# Patient Record
Sex: Male | Born: 1970 | Race: White | Hispanic: No | Marital: Married | Smoking: Never smoker
Health system: Southern US, Community
[De-identification: ages and names within clinical notes are randomized; demographics above are authoritative.]

## PROBLEM LIST (undated history)

## (undated) DIAGNOSIS — B181 Chronic viral hepatitis B without delta-agent: Secondary | ICD-10-CM

## (undated) DIAGNOSIS — E7800 Pure hypercholesterolemia, unspecified: Secondary | ICD-10-CM

## (undated) DIAGNOSIS — E109 Type 1 diabetes mellitus without complications: Secondary | ICD-10-CM

## (undated) HISTORY — DX: Type 1 diabetes mellitus without complications: E10.9

## (undated) HISTORY — DX: Pure hypercholesterolemia, unspecified: E78.00

## (undated) HISTORY — DX: Chronic viral hepatitis B without delta-agent: B18.1

---

## 2003-03-13 ENCOUNTER — Encounter: Admission: RE | Admit: 2003-03-13 | Discharge: 2003-06-11 | Payer: Self-pay | Admitting: *Deleted

## 2004-04-29 ENCOUNTER — Encounter: Admission: RE | Admit: 2004-04-29 | Discharge: 2004-07-28 | Payer: Self-pay | Admitting: *Deleted

## 2005-02-12 ENCOUNTER — Ambulatory Visit: Payer: Self-pay | Admitting: "Endocrinology

## 2005-02-25 ENCOUNTER — Ambulatory Visit: Payer: Self-pay | Admitting: "Endocrinology

## 2005-05-03 ENCOUNTER — Ambulatory Visit: Payer: Self-pay | Admitting: "Endocrinology

## 2005-05-05 ENCOUNTER — Ambulatory Visit: Payer: Self-pay | Admitting: "Endocrinology

## 2005-05-07 ENCOUNTER — Ambulatory Visit: Payer: Self-pay | Admitting: "Endocrinology

## 2005-05-10 ENCOUNTER — Ambulatory Visit: Payer: Self-pay | Admitting: "Endocrinology

## 2005-07-06 ENCOUNTER — Ambulatory Visit: Payer: Self-pay | Admitting: "Endocrinology

## 2005-09-01 ENCOUNTER — Ambulatory Visit: Payer: Self-pay | Admitting: "Endocrinology

## 2006-06-01 ENCOUNTER — Ambulatory Visit: Payer: Self-pay | Admitting: "Endocrinology

## 2006-07-13 ENCOUNTER — Ambulatory Visit: Payer: Self-pay | Admitting: "Endocrinology

## 2006-09-23 ENCOUNTER — Ambulatory Visit: Payer: Self-pay | Admitting: "Endocrinology

## 2006-12-02 ENCOUNTER — Ambulatory Visit: Payer: Self-pay | Admitting: Family Medicine

## 2006-12-20 ENCOUNTER — Ambulatory Visit: Payer: Self-pay | Admitting: "Endocrinology

## 2007-08-10 ENCOUNTER — Ambulatory Visit: Payer: Self-pay | Admitting: "Endocrinology

## 2007-12-04 ENCOUNTER — Ambulatory Visit: Payer: Self-pay | Admitting: "Endocrinology

## 2007-12-06 ENCOUNTER — Ambulatory Visit: Payer: Self-pay | Admitting: Family Medicine

## 2008-07-30 ENCOUNTER — Ambulatory Visit: Payer: Self-pay | Admitting: "Endocrinology

## 2008-07-30 LAB — CONVERTED CEMR LAB
AST: 85 units/L — ABNORMAL HIGH (ref 0–37)
BUN: 9 mg/dL (ref 6–23)
Creatinine, Urine: 274.4 mg/dL
Free T4: 1.08 ng/dL (ref 0.89–1.80)
HDL: 49 mg/dL (ref >39)
LDL Cholesterol: 88 mg/dL (ref 0–99)
Microalb, Ur: 0.59 mg/dL (ref 0.00–1.89)
Sex Hormone Binding: 56 nmol/L (ref 13–71)
Testosterone-% Free: 1.5 % — ABNORMAL LOW (ref 1.6–2.9)
Testosterone: 577.76 ng/dL (ref 350–890)
Total Protein: 7.4 g/dL (ref 6.0–8.3)

## 2008-08-05 ENCOUNTER — Ambulatory Visit: Payer: Self-pay | Admitting: Family Medicine

## 2008-08-06 ENCOUNTER — Encounter: Admission: RE | Admit: 2008-08-06 | Discharge: 2008-08-06 | Payer: Self-pay | Admitting: Family Medicine

## 2008-08-27 ENCOUNTER — Ambulatory Visit: Payer: Self-pay | Admitting: "Endocrinology

## 2008-08-30 DIAGNOSIS — E785 Hyperlipidemia, unspecified: Secondary | ICD-10-CM | POA: Insufficient documentation

## 2008-08-30 DIAGNOSIS — E069 Thyroiditis, unspecified: Secondary | ICD-10-CM | POA: Insufficient documentation

## 2008-08-30 DIAGNOSIS — E049 Nontoxic goiter, unspecified: Secondary | ICD-10-CM | POA: Insufficient documentation

## 2008-08-30 DIAGNOSIS — I10 Essential (primary) hypertension: Secondary | ICD-10-CM | POA: Insufficient documentation

## 2008-08-30 DIAGNOSIS — R000 Tachycardia, unspecified: Secondary | ICD-10-CM

## 2008-08-30 DIAGNOSIS — E109 Type 1 diabetes mellitus without complications: Secondary | ICD-10-CM

## 2008-08-30 DIAGNOSIS — G589 Mononeuropathy, unspecified: Secondary | ICD-10-CM | POA: Insufficient documentation

## 2008-08-30 DIAGNOSIS — K3184 Gastroparesis: Secondary | ICD-10-CM | POA: Insufficient documentation

## 2008-09-02 ENCOUNTER — Ambulatory Visit: Payer: Self-pay | Admitting: Internal Medicine

## 2008-09-02 DIAGNOSIS — R740 Nonspecific elevation of levels of transaminase and lactic acid dehydrogenase [LDH]: Secondary | ICD-10-CM

## 2008-09-02 LAB — CONVERTED CEMR LAB
Ferritin: 36.1 ng/mL (ref 22.0–322.0)
Iron: 154 ug/dL (ref 42–165)
Prothrombin Time: 10.6 s — ABNORMAL LOW (ref 10.9–13.3)
Saturation Ratios: 35.6 % (ref 20.0–50.0)
Transferrin: 308.9 mg/dL (ref >=212.0)

## 2008-09-04 ENCOUNTER — Ambulatory Visit (HOSPITAL_COMMUNITY): Admission: RE | Admit: 2008-09-04 | Discharge: 2008-09-04 | Payer: Self-pay | Admitting: Internal Medicine

## 2008-09-06 ENCOUNTER — Ambulatory Visit: Payer: Self-pay | Admitting: "Endocrinology

## 2008-09-09 ENCOUNTER — Ambulatory Visit: Payer: Self-pay | Admitting: "Endocrinology

## 2008-09-09 LAB — CONVERTED CEMR LAB
Anti Nuclear Antibody(ANA): NEGATIVE
Ceruloplasmin: 43 mg/dL (ref 21–63)
HCV Ab: NEGATIVE
HEP B PCR: >110000000 — ABNORMAL HIGH (ref <29)
Hep B E Ab: NEGATIVE
Hep B S Ab: NEGATIVE
Hepatitis B Surface Ag: POSITIVE — AB
Tissue Transglutaminase Ab, IgA: 0.8 units (ref <7)

## 2008-09-11 ENCOUNTER — Encounter: Payer: Self-pay | Admitting: Internal Medicine

## 2008-09-30 ENCOUNTER — Ambulatory Visit: Payer: Self-pay | Admitting: Internal Medicine

## 2008-09-30 DIAGNOSIS — B1910 Unspecified viral hepatitis B without hepatic coma: Secondary | ICD-10-CM

## 2008-10-28 ENCOUNTER — Ambulatory Visit: Payer: Self-pay | Admitting: Family Medicine

## 2008-11-13 ENCOUNTER — Encounter: Payer: Self-pay | Admitting: Internal Medicine

## 2008-11-28 ENCOUNTER — Ambulatory Visit: Payer: Self-pay | Admitting: "Endocrinology

## 2009-02-12 ENCOUNTER — Encounter: Payer: Self-pay | Admitting: Internal Medicine

## 2009-04-02 ENCOUNTER — Ambulatory Visit: Payer: Self-pay | Admitting: Internal Medicine

## 2009-04-04 LAB — CONVERTED CEMR LAB
Albumin: 3.8 g/dL (ref 3.5–5.2)
BUN: 6 mg/dL (ref 6–23)
Basophils Absolute: 0 10*3/uL (ref 0.0–0.1)
Basophils Relative: 0.1 % (ref 0.0–3.0)
Eosinophils Absolute: 0 10*3/uL (ref 0.0–0.7)
Eosinophils Relative: 0.7 % (ref 0.0–5.0)
Potassium: 4.5 meq/L (ref 3.5–5.1)
RDW: 12.5 % (ref 11.5–14.6)
Sodium: 143 meq/L (ref 135–145)
Total Bilirubin: 1.1 mg/dL (ref 0.3–1.2)

## 2009-04-07 ENCOUNTER — Ambulatory Visit: Payer: Self-pay | Admitting: "Endocrinology

## 2009-04-11 LAB — CONVERTED CEMR LAB: Hepatitis B DNA: 55300 IU/mL — ABNORMAL HIGH (ref <29)

## 2009-05-21 ENCOUNTER — Encounter: Payer: Self-pay | Admitting: Internal Medicine

## 2009-08-07 ENCOUNTER — Ambulatory Visit: Payer: Self-pay | Admitting: "Endocrinology

## 2009-08-22 ENCOUNTER — Ambulatory Visit: Payer: Self-pay | Admitting: Internal Medicine

## 2009-08-25 LAB — CONVERTED CEMR LAB
ALT: 45 units/L (ref 0–53)
AST: 35 units/L (ref 0–37)
BUN: 6 mg/dL (ref 6–23)
Basophils Relative: 1.4 % (ref 0.0–3.0)
Bilirubin, Direct: 0.1 mg/dL (ref 0.0–0.3)
CO2: 32 meq/L (ref 19–32)
Calcium: 9.3 mg/dL (ref 8.4–10.5)
Chloride: 101 meq/L (ref 96–112)
Creatinine, Ser: 1.1 mg/dL (ref 0.4–1.5)
HCT: 43 % (ref 39.0–52.0)
Lymphocytes Relative: 57.7 % — ABNORMAL HIGH (ref 12.0–46.0)
MCHC: 33.7 g/dL (ref 30.0–36.0)
MCV: 93 fL (ref 78.0–100.0)
Monocytes Absolute: 0.5 10*3/uL (ref 0.1–1.0)
Potassium: 4.3 meq/L (ref 3.5–5.1)
RBC: 4.63 M/uL (ref 4.22–5.81)
RDW: 12.1 % (ref 11.5–14.6)
WBC: 7.7 10*3/uL (ref 4.5–10.5)

## 2009-09-08 ENCOUNTER — Telehealth: Payer: Self-pay | Admitting: Internal Medicine

## 2009-10-03 ENCOUNTER — Ambulatory Visit: Payer: Self-pay | Admitting: "Endocrinology

## 2009-11-17 ENCOUNTER — Ambulatory Visit: Payer: Self-pay | Admitting: "Endocrinology

## 2009-11-21 ENCOUNTER — Encounter: Payer: Self-pay | Admitting: Internal Medicine

## 2010-03-02 IMAGING — CR DG CERVICAL SPINE 2 OR 3 VIEWS
3 series · 3 of 3 positions shown · non-contrast
Comparison: None

CLINICAL DATA: Neck pain

CERVICAL SPINE - 2-3 VIEW

[view not recorded (1 of 3)]
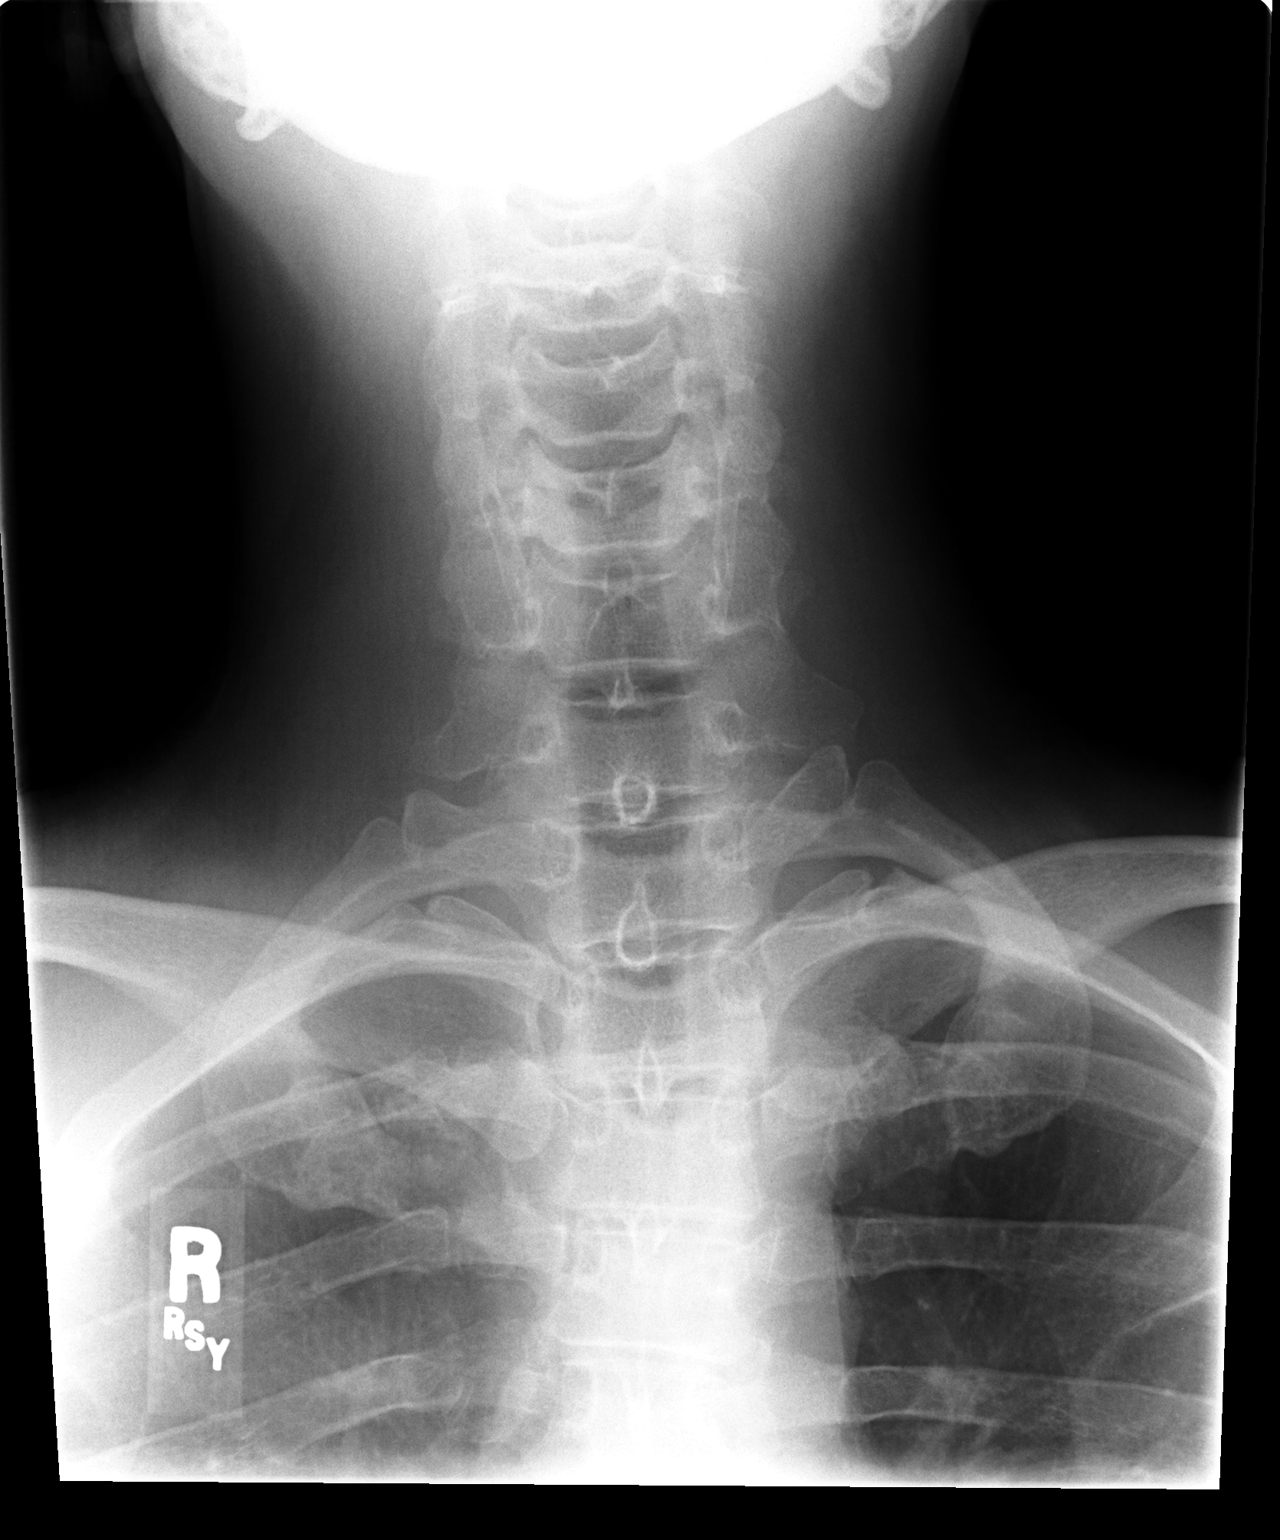

[view not recorded (2 of 3)]
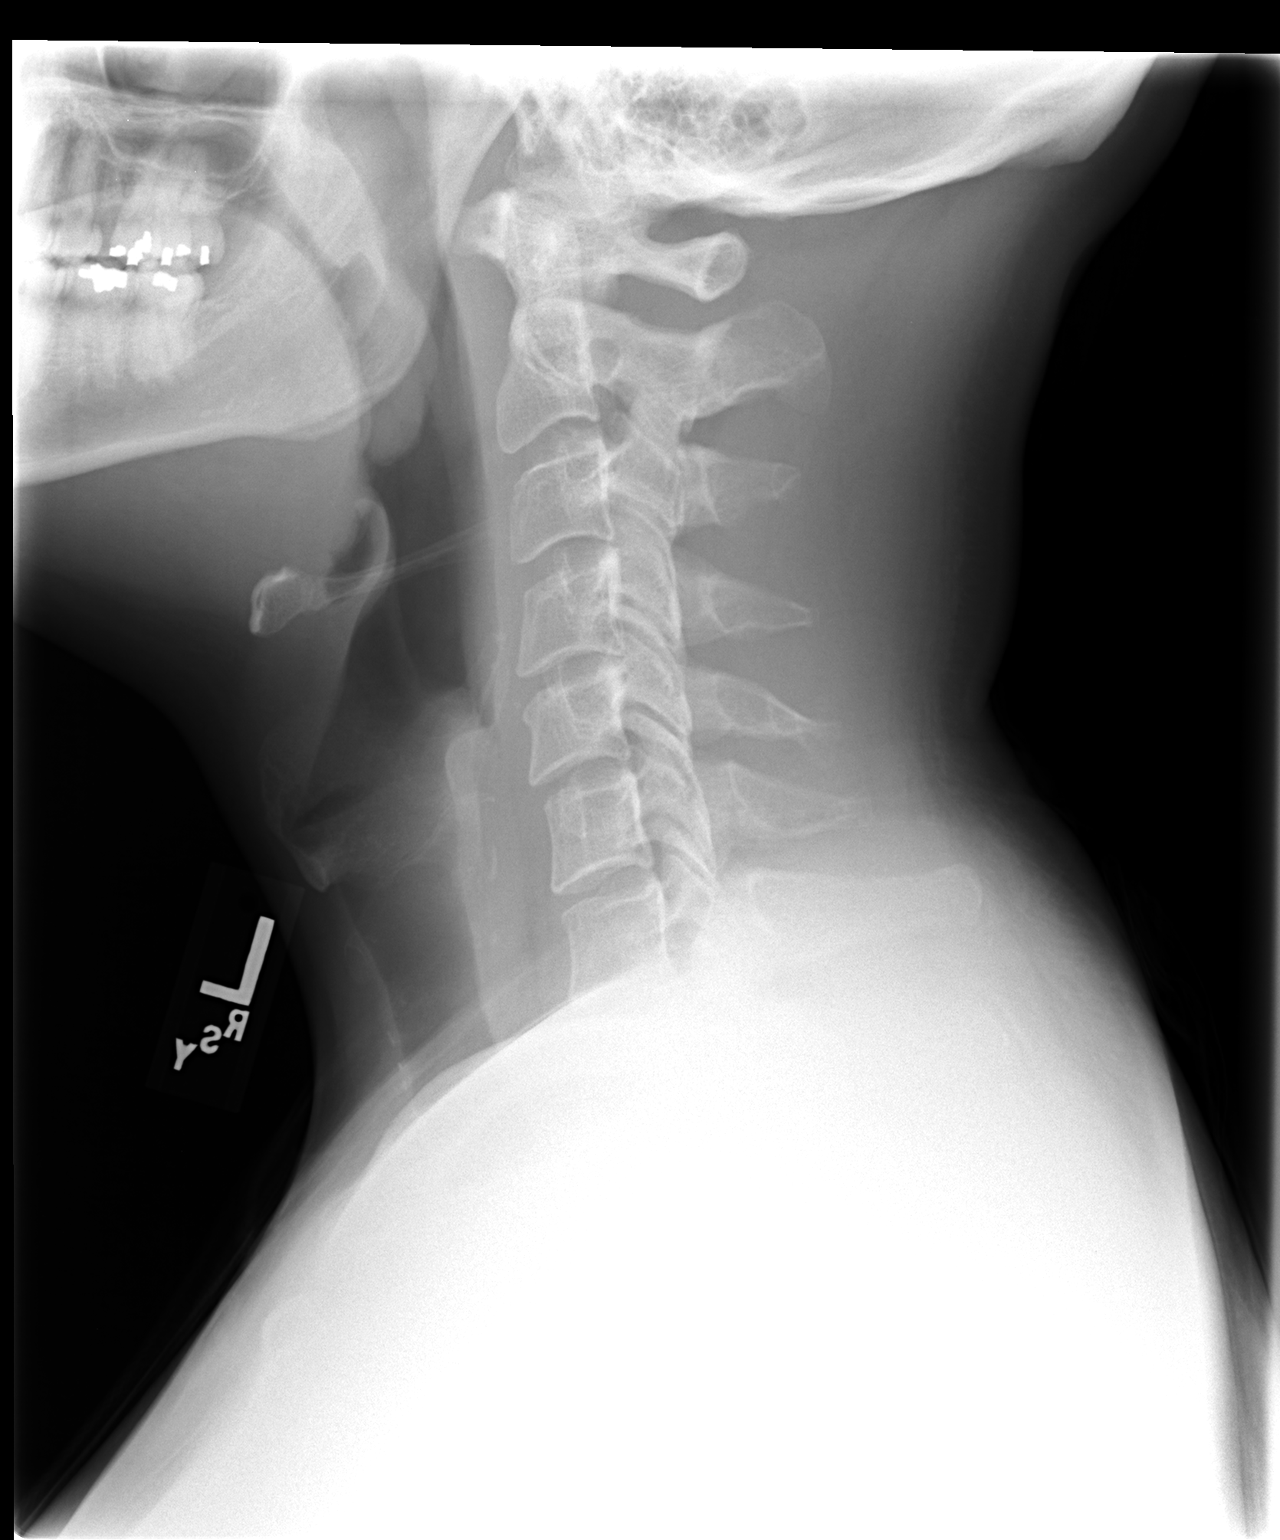

[view not recorded (3 of 3)]
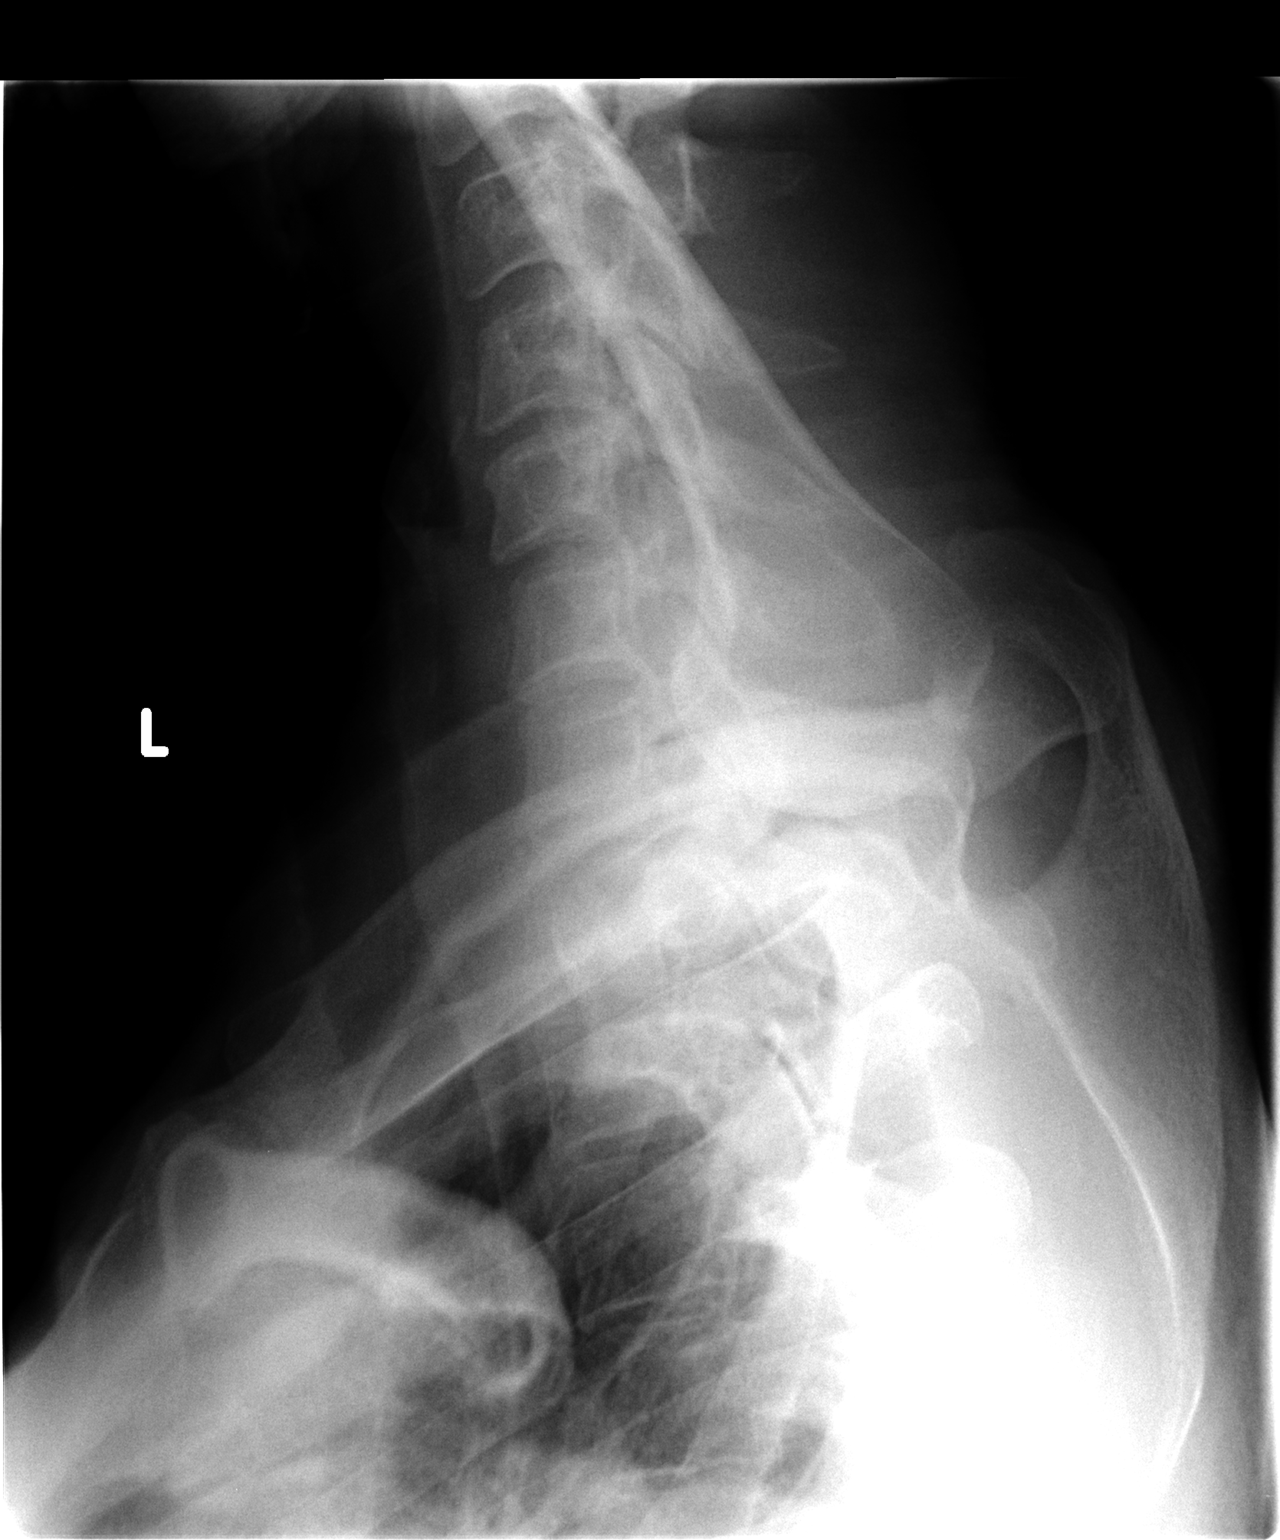

[3 of 3 positions shown; findings below may reference images not displayed]

FINDINGS: The lateral film demonstrates normal overall alignment of
the cervical vertebral bodies.  Minimal degenerative disease at C4-
5 and C5-6.  No acute bony findings or abnormal prevertebral soft
tissue swelling.  Small cervical ribs are noted bilaterally.  The
lung apices are clear.
IMPRESSION: 1.  Normal alignment and no acute bony findings.  Minimal
degenerative disc disease at C4-5 and C5-6.

## 2010-03-31 IMAGING — US US ABDOMEN COMPLETE
1 series · 14 of 25 positions shown · non-contrast
Comparison: None available

CLINICAL DATA: Elevated LFTs, hypertension, diabetes

ABDOMEN ULTRASOUND
TECHNIQUE: Complete abdominal ultrasound examination was performed
including evaluation of the liver, gallbladder, bile ducts,
pancreas, kidneys, spleen, IVC, and abdominal aorta.

[Series 1: unknown · 0.38mm/px · 14 of 93 slices shown]
[im 1/93]
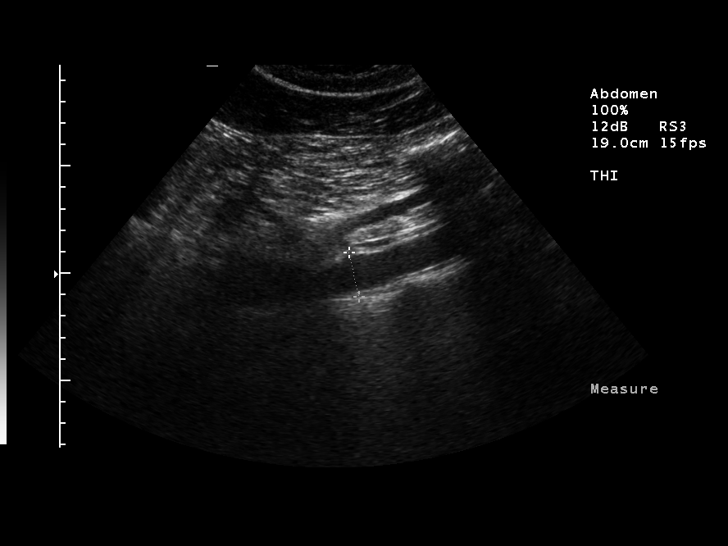
[im 8/93]
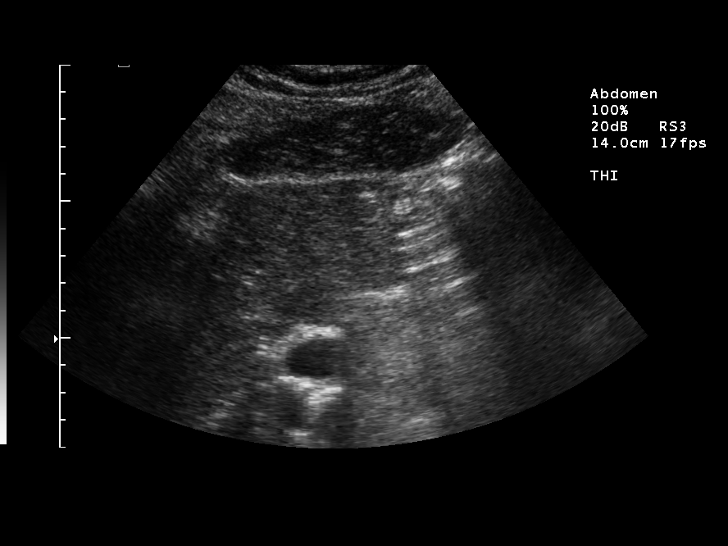
[im 16/93]
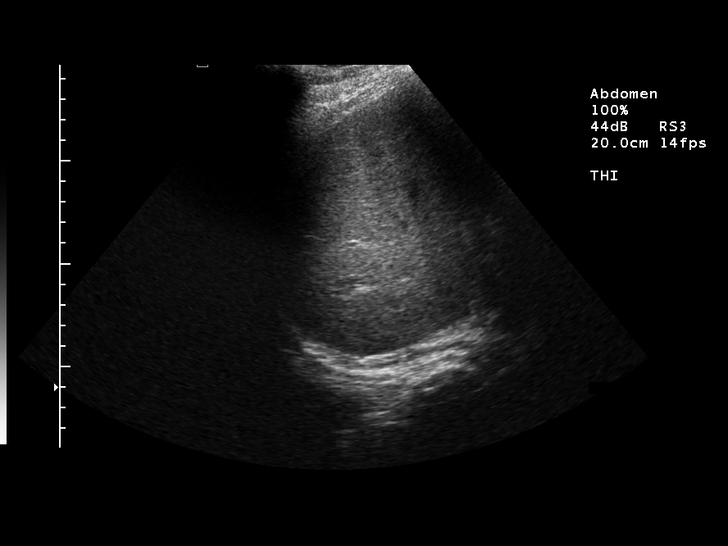
[im 24/93]
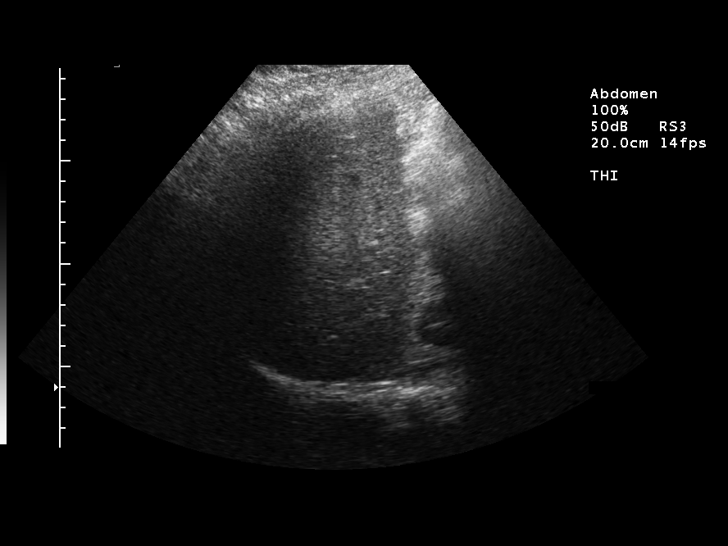
[im 31/93]
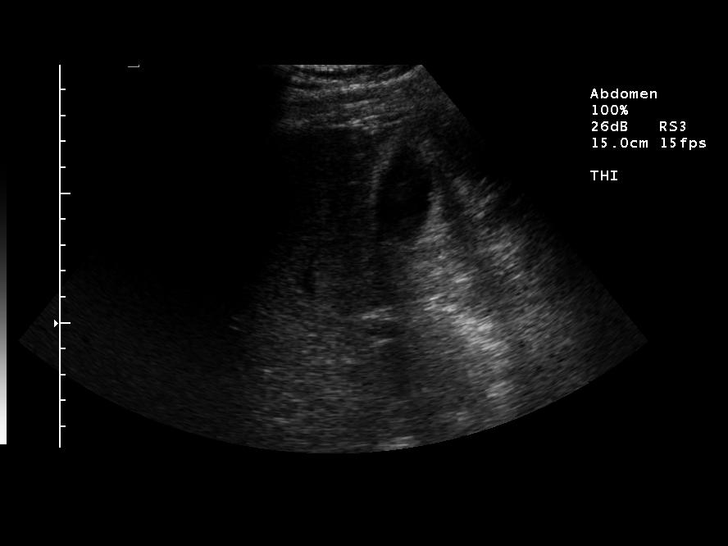
[im 35/93]
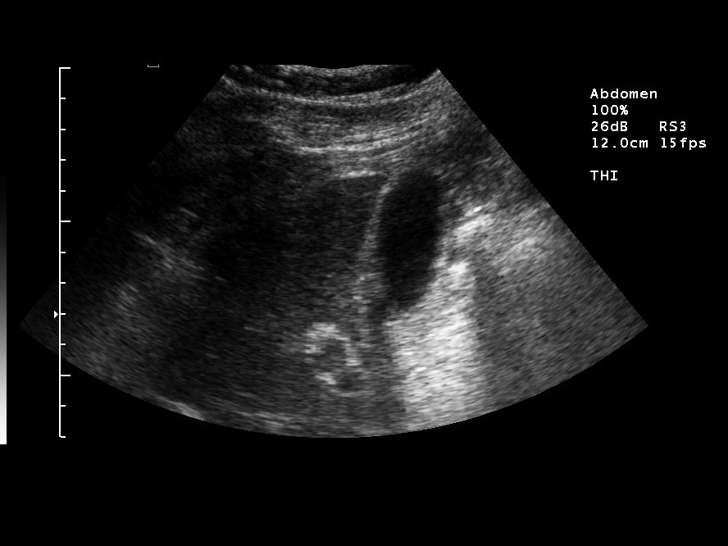
[im 43/93]
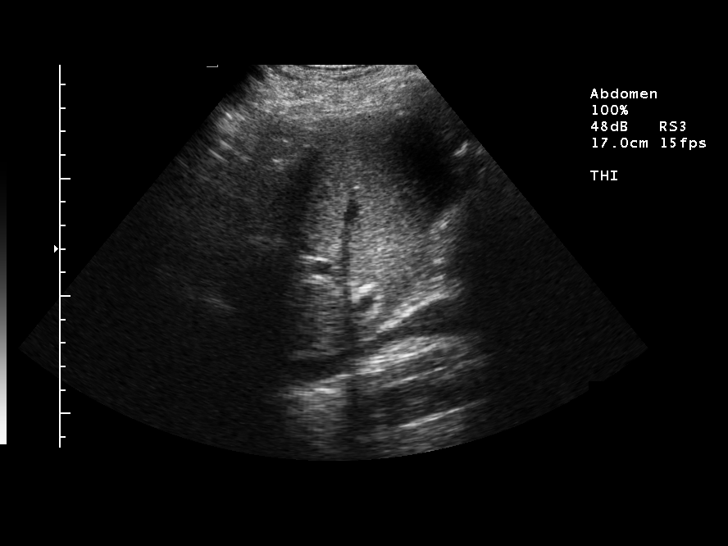
[im 50/93]
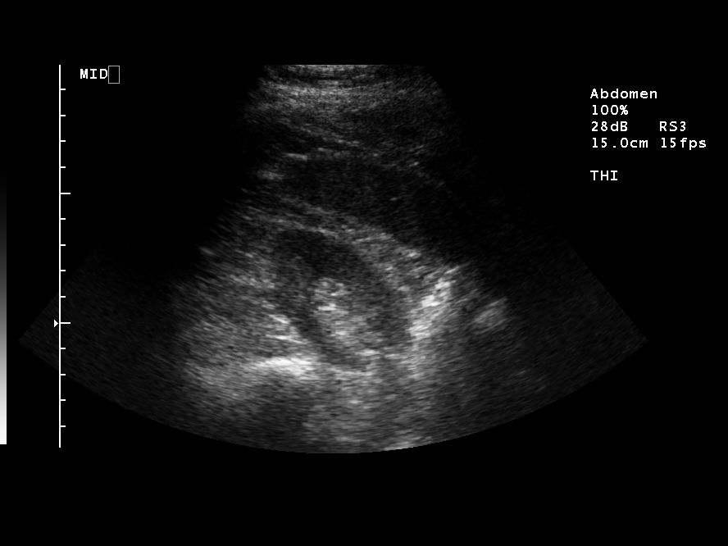
[im 58/93]
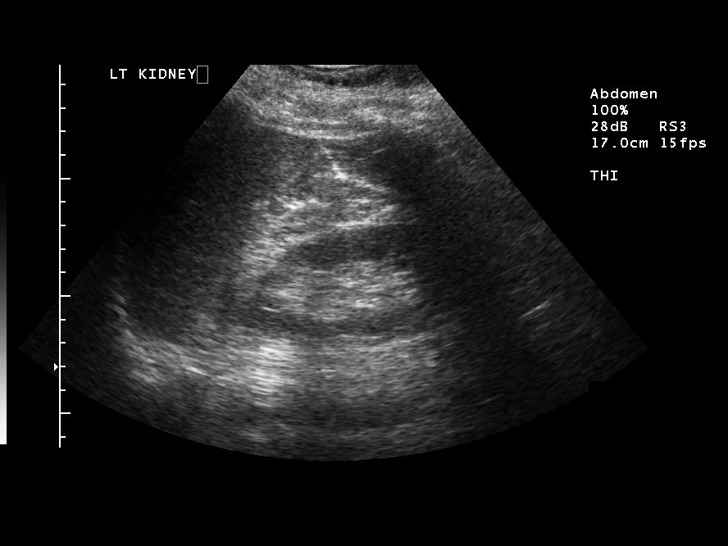
[im 62/93]
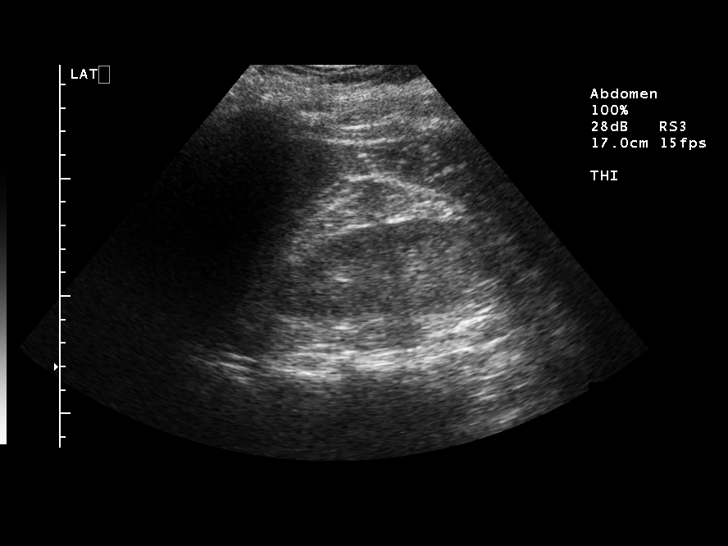
[im 70/93]
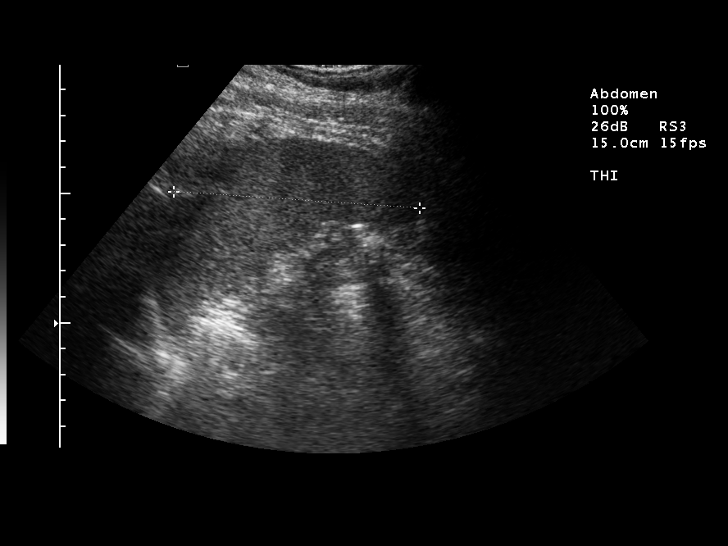
[im 77/93]
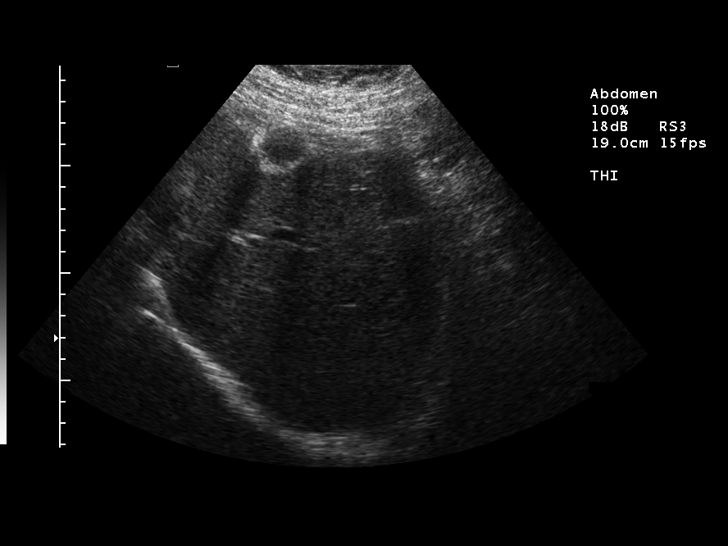
[im 85/93]
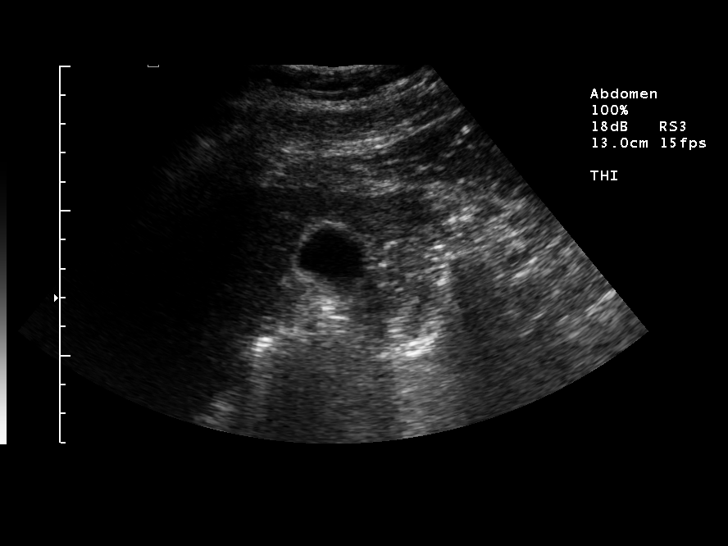
[im 93/93]
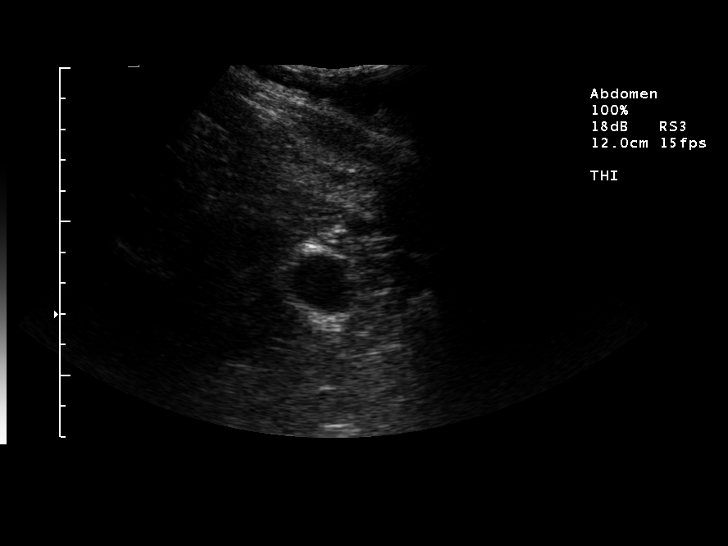

[14 of 25 positions shown; findings below may reference images not displayed]

FINDINGS: Gallbladder:  No gallstones.  No gallbladder wall thickening or
pericholecystic fluid. Negative sonographic Murphy's sign per the
ultrasound technologist.

Common bile duct: Normal in caliber.

Liver:  Normal size and echotexture.  No focal parenchymal
abnormalities.

Inferior vena cava:  Patent.

Pancreas:  Visualized portions unremarkable.

Spleen:  Normal size and echotexture without focal parenchymal
abnormalities.

Right kidney:  No hydronephrosis.   Normal parenchymal echotexture
without focal abnormalities.

Left kidney:  No hydronephrosis. Normal parenchymal echotexture
without focal abnormalities.

Abdominal aorta:  Visualized portions normal in caliber,
unremarkable.
IMPRESSION: Negative.  Unremarkable liver.

## 2010-04-14 ENCOUNTER — Ambulatory Visit: Payer: Self-pay | Admitting: "Endocrinology

## 2010-05-22 ENCOUNTER — Encounter: Payer: Self-pay | Admitting: Internal Medicine

## 2010-08-03 ENCOUNTER — Ambulatory Visit: Payer: Self-pay | Admitting: "Endocrinology

## 2010-09-23 ENCOUNTER — Encounter: Payer: Self-pay | Admitting: Internal Medicine

## 2010-10-20 NOTE — Letter (Signed)
[facility]   Imported By: Sherian Rein 06/04/2010 13:20:55  _____________________________________________________________________  External Attachment:    Type:   Image     Comment:   External Document

## 2010-10-20 NOTE — Letter (Signed)
[facility]   Imported By: Sherian Rein 11/28/2009 09:41:59  _____________________________________________________________________  External Attachment:    Type:   Image     Comment:   External Document

## 2010-10-22 NOTE — Letter (Signed)
[facility]   Imported By: Sherian Rein 10/05/2010 08:00:38  _____________________________________________________________________  External Attachment:    Type:   Image     Comment:   External Document

## 2010-11-05 ENCOUNTER — Ambulatory Visit (INDEPENDENT_AMBULATORY_CARE_PROVIDER_SITE_OTHER): Payer: BC Managed Care – PPO | Admitting: "Endocrinology

## 2010-11-05 DIAGNOSIS — E1065 Type 1 diabetes mellitus with hyperglycemia: Secondary | ICD-10-CM

## 2010-11-05 DIAGNOSIS — E1069 Type 1 diabetes mellitus with other specified complication: Secondary | ICD-10-CM

## 2010-11-05 DIAGNOSIS — E1142 Type 2 diabetes mellitus with diabetic polyneuropathy: Secondary | ICD-10-CM

## 2010-11-05 DIAGNOSIS — I10 Essential (primary) hypertension: Secondary | ICD-10-CM

## 2010-11-05 DIAGNOSIS — R000 Tachycardia, unspecified: Secondary | ICD-10-CM

## 2011-01-11 ENCOUNTER — Other Ambulatory Visit: Payer: Self-pay | Admitting: *Deleted

## 2011-01-11 ENCOUNTER — Encounter: Payer: Self-pay | Admitting: *Deleted

## 2011-02-09 ENCOUNTER — Ambulatory Visit (INDEPENDENT_AMBULATORY_CARE_PROVIDER_SITE_OTHER): Payer: BC Managed Care – PPO | Admitting: "Endocrinology

## 2011-02-09 VITALS — BP 125/75 | HR 82 | Wt 233.0 lb

## 2011-02-09 DIAGNOSIS — IMO0002 Reserved for concepts with insufficient information to code with codable children: Secondary | ICD-10-CM

## 2011-02-09 DIAGNOSIS — E1065 Type 1 diabetes mellitus with hyperglycemia: Secondary | ICD-10-CM

## 2011-02-09 DIAGNOSIS — E1169 Type 2 diabetes mellitus with other specified complication: Secondary | ICD-10-CM

## 2011-02-09 DIAGNOSIS — G609 Hereditary and idiopathic neuropathy, unspecified: Secondary | ICD-10-CM

## 2011-02-09 DIAGNOSIS — E063 Autoimmune thyroiditis: Secondary | ICD-10-CM

## 2011-02-09 DIAGNOSIS — E11649 Type 2 diabetes mellitus with hypoglycemia without coma: Secondary | ICD-10-CM

## 2011-02-09 DIAGNOSIS — E1142 Type 2 diabetes mellitus with diabetic polyneuropathy: Secondary | ICD-10-CM

## 2011-02-09 DIAGNOSIS — B353 Tinea pedis: Secondary | ICD-10-CM

## 2011-02-09 DIAGNOSIS — Z8619 Personal history of other infectious and parasitic diseases: Secondary | ICD-10-CM

## 2011-02-09 DIAGNOSIS — I10 Essential (primary) hypertension: Secondary | ICD-10-CM

## 2011-02-09 DIAGNOSIS — G909 Disorder of the autonomic nervous system, unspecified: Secondary | ICD-10-CM

## 2011-02-09 DIAGNOSIS — E1149 Type 2 diabetes mellitus with other diabetic neurological complication: Secondary | ICD-10-CM

## 2011-02-09 DIAGNOSIS — R000 Tachycardia, unspecified: Secondary | ICD-10-CM

## 2011-02-09 LAB — GLUCOSE, POCT (MANUAL RESULT ENTRY): POC Glucose: 116

## 2011-02-09 MED ORDER — KETOCONAZOLE 2 % EX CREA: TOPICAL_CREAM | Freq: Every day | CUTANEOUS | Status: AC

## 2011-02-09 NOTE — Progress Notes (Addendum)
CC: FU T1DM, hypoglycemia, hypertension, hyperlipidemia, autonomic neuropathy, tachycardia, gastroparesis, peripheral neuropathy, thyroiditis, hepatitis B, fatigue  HPI: 40 y.o. white male  1. Mr. Tuite was diagnosed with T2DM about 2002-2003. He was treated with metformin, Avandia, and Amaryl, but BGs were not well controlled, so Lantus was begun in 2005. When it became apparent that he would need a multiple daily injection regimen with Lantus and Novolog, he was referred to me by [name], RN, CDE, of the [facility]. I saw him for the first time on 05.26.06. He had just been started on Novolog aspart insulin at meals (120/25/10 plan). His PMH included recurrent hypoglycemia, hypertension, dyslipidemia, and proteinuria. He had had oral surgery in the past, but no other surgery. His psychiatric diagnoses included depression, anxiety, and dssociative identity disorder. He was also allergic to penicillin.  SH included the fact that he was gay. All HIV tests had been negative through.  He had recently leen laid off, had no health insurance, March 2006.His HbA1c was 10.6%. On exam he had a goiter and mild peripheral neuropathy. 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 We accepted him as a charity patient and enrolled him in our Diabetes Survival Skills Program. He was converted to a Medtronic 508 insulin pump in August 2006.  2. During the next five years his DM self-care efforts and HbA1c values waxed and waned, with A1c values ranging from 7.0-9.2%. His worst BGs occurred when he was being treated with interferon for hepatitis B, from which he is now in remission. Hehas been gainfully employed at [employer] for several years and has had health insurance. In 2011 we changed hm to a Medtronic 723 (Revel) pump and started him on the Medtronic CGM sensor. His BGs have significantly improved. 3. His last PSSG visit was on 02.16.12. His  HbA1c then was 6.8%. In the interim he has had lots of stress, especially in the past two weeks, resulting in less concentration on DM management and more hectic BGs.  4. PROS: Constitutional: The patient feels pretty well, is healthy, and has no significant complaints. Eyes: Vision is good. His last eye exam was 2-3 months ago. There was no sign of diabetic eye disease at that visit. He has no significant eye complaints. Neck:  His anterior neck is often swollen and tender.  Heart: Heart rate increases with exercise or other physical activity. The patient has no complaints of palpitations, irregular heat beats, chest pain, or chest pressure. Gastrointestinal: He still has a lot of abdominal bloating. When he eats a lot of beans he has a great amount of rectal gas. Bowel movents seem normal. Hands: His hands have been itching a lot recently. Legs: Muscle mass and strength seem normal. There are no complaints of numbness, tingling, burning, or pain. No edema is noted. Feet:  His feet sometimes itch like crazy. There are no complaints of numbness, tingling, burning, or pain. No edema is noted. Hypoglycemia: This has not  been a major issue recently. 4. BG printout: He has had one BG of 60. If he changes his pump sit every three days, his BGs are good. If he waits too long between site changes, however, he will have many higher BGs. 5. CGM sensor readout: He tends to have higher BGs after supper, because after he eats he just relaxes and is pretty inactive. His BGs are sometimes low before dinner, especialy if he has just finished exercising. The BGs  are often higher after breakfast, depending upon the amount of carbs he ingests.  PMFSH:  1. 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  2. He has been using his elliptical machine at times, but not as much as several months ago.  ROS: Mr. husain costabile not have any significant problems involving his other six body  systems.  PHYSICAL EXAM: BP 125/75  Pulse 82  Wt 233 lb (105.688 kg)  HbA1c is 6.7%. Constitutional: The patient looks healthy, albeit overweight. He appears physically and emotionally well.  Eyes: There is no arcus or proptosis.  Mouth: The oropharynx appears normal. The tongue appears normal. There is normal oral moisture. There is no obvious gingivitis. Neck: There are no bruits present. The thyroid gland appears enlarged. The thyroid gland is approximately 20-25 grams in size. The consistency of the thyroid gland is firm. The left lobe of the thyroid gland is mildly  tender to palpation. Lungs: The lungs are clear. Air movement is good. Heart: The heart rhythm and rate appear normal. Heart sounds S1 and S2 are normal. I do not appreciate any pathologic heart murmurs. Abdomen: The abdominal size is enlarged. Bowel sounds are normal. The abdomen is soft and non-tender. There is no obviously palpable hepatomegaly, splenomegaly, or other masses.  Arms: Muscle mass appears appropriate for age.  Hands: There is no obvious tremor. Phalangeal and metacarpophalangeal joints appear normal. He has several papules of the skin of his right palm He has two similar papules on the left palm. Legs: Muscle mass appears appropriate for age. There is no edema.  Feet: There are no significant deformities. Dorsalis pedis pulses are 1+ on the right and 2+ on the left. He does not have obvious tinea pedis.  Neurologic: Muscle strength is normal for age and gender  in both the upper and the lower extremities. Muscle tone appears normal. Sensation to touch is normal in the legs and feet.  Labs: 02.16.12  ASSESSMENT:  1. T1DM: His BG control is quite good. His HbA1c is <7.0 without having much hypoglycemia. 2. Hypoglycemia: relatively infrequent 3. Thyroiditis: He again has low-level inflammation of the thyroid gland. 4. Hypertension: BP is well-controlled. 5. Hyperlipidemia: Recent lipid panel results were  good. 6. Autonomic neuropathy and tachycardia: With the improvement in BG control, his neuropathy and tachycardia have begun to reverse. 7. Hepatitis B: Recent viral load studies at [facility] showed no evidence of residual disease.  PLAN: Try to change pump site every three days. 2. Ketoconazole 2% cream to feet bid for 2 weeks. 3. Exercise 45-60 minutes every day. 4. FU appointment in 3 months.  Level of Service: This visit lasted in excess of 40 minutes. More than 50% of the visit was devoted to counseling.

## 2011-02-09 NOTE — Patient Instructions (Signed)
Please apply ketoconzole cream to feet once a day.

## 2011-03-30 ENCOUNTER — Other Ambulatory Visit: Payer: Self-pay | Admitting: "Endocrinology

## 2011-06-02 ENCOUNTER — Ambulatory Visit: Payer: BC Managed Care – PPO | Admitting: "Endocrinology

## 2011-10-13 ENCOUNTER — Other Ambulatory Visit: Payer: Self-pay | Admitting: *Deleted

## 2011-10-13 DIAGNOSIS — E1065 Type 1 diabetes mellitus with hyperglycemia: Secondary | ICD-10-CM

## 2011-10-13 MED ORDER — GLUCOSE BLOOD VI STRP: ORAL_STRIP | Status: DC

## 2011-10-26 ENCOUNTER — Ambulatory Visit: Payer: BC Managed Care – PPO | Admitting: "Endocrinology

## 2011-12-07 ENCOUNTER — Ambulatory Visit (INDEPENDENT_AMBULATORY_CARE_PROVIDER_SITE_OTHER): Payer: BC Managed Care – PPO | Admitting: Family Medicine

## 2011-12-07 ENCOUNTER — Encounter: Payer: Self-pay | Admitting: Family Medicine

## 2011-12-07 VITALS — BP 120/80 | HR 97 | Wt 232.0 lb

## 2011-12-07 DIAGNOSIS — A630 Anogenital (venereal) warts: Secondary | ICD-10-CM

## 2011-12-07 NOTE — Progress Notes (Signed)
  Subjective:    Patient ID: Samuel [facility], male    DOB: November 25, 1970, 41 y.o.   MRN: [id number]  HPI He is here for evaluation of lesions again noted in the gluteal cleft proximal to the anus.   Review of Systems     Objective:   Physical Exam 2 small warts noted in the gluteal cleft. A even smaller one is noted on the anus.       Assessment & Plan:   1. Anal warts    the lesions were injected with Xylocaine and hyfrecated without difficulty.

## 2012-01-13 ENCOUNTER — Encounter: Payer: Self-pay | Admitting: "Endocrinology

## 2012-01-13 ENCOUNTER — Ambulatory Visit (INDEPENDENT_AMBULATORY_CARE_PROVIDER_SITE_OTHER): Payer: BC Managed Care – PPO | Admitting: "Endocrinology

## 2012-01-13 VITALS — BP 143/94 | HR 97 | Wt 229.1 lb

## 2012-01-13 DIAGNOSIS — E1142 Type 2 diabetes mellitus with diabetic polyneuropathy: Secondary | ICD-10-CM

## 2012-01-13 DIAGNOSIS — E162 Hypoglycemia, unspecified: Secondary | ICD-10-CM

## 2012-01-13 DIAGNOSIS — E063 Autoimmune thyroiditis: Secondary | ICD-10-CM

## 2012-01-13 DIAGNOSIS — E1065 Type 1 diabetes mellitus with hyperglycemia: Secondary | ICD-10-CM

## 2012-01-13 DIAGNOSIS — E1149 Type 2 diabetes mellitus with other diabetic neurological complication: Secondary | ICD-10-CM

## 2012-01-13 DIAGNOSIS — E1143 Type 2 diabetes mellitus with diabetic autonomic (poly)neuropathy: Secondary | ICD-10-CM

## 2012-01-13 DIAGNOSIS — G609 Hereditary and idiopathic neuropathy, unspecified: Secondary | ICD-10-CM

## 2012-01-13 DIAGNOSIS — I10 Essential (primary) hypertension: Secondary | ICD-10-CM

## 2012-01-13 DIAGNOSIS — G909 Disorder of the autonomic nervous system, unspecified: Secondary | ICD-10-CM

## 2012-01-13 DIAGNOSIS — E049 Nontoxic goiter, unspecified: Secondary | ICD-10-CM

## 2012-01-13 LAB — POCT GLYCOSYLATED HEMOGLOBIN (HGB A1C): Hemoglobin A1C: 7.1

## 2012-01-13 NOTE — Progress Notes (Signed)
CC: FU T1DM, hypoglycemia, hypertension, hyperlipidemia, autonomic neuropathy, tachycardia, gastroparesis, peripheral neuropathy, thyroiditis, hepatitis B, fatigue  HPI: 41 y.o. white male  1. Samuel [facility] was diagnosed with T2DM about 2002-2003. He was treated with metformin, Avandia, and Amaryl, but BGs were not well controlled, so Lantus was begun in 2005. When it became apparent that he would need a multiple daily injection regimen with Lantus and Novolog, he was referred to me by [name], RN, CDE, of the [facility]. I saw him for the first time on 05.26.06. He had just been started on Novolog aspart insulin at meals (120/25/10 plan). His PMH included recurrent hypoglycemia, hypertension, dyslipidemia, and proteinuria. He had had oral surgery in the past, but no other surgery. His psychiatric diagnoses included depression, anxiety, and dissociative identity disorder. He was also allergic to penicillin.  SH included the fact that he was gay. All HIV tests had been negative through.  He had recently been laid off, had no health insurance, March 2006.His HbA1c was 10.6%. On exam he had a goiter and mild peripheral neuropathy. 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 We accepted him as a charity patient and enrolled him in our Diabetes Survival Skills Program. He was converted to a Medtronic 508 insulin pump in August 2006.  2. During the next five years his DM self-care efforts and HbA1c values waxed and waned, with A1c values ranging from 7.0-9.2%. His worst BGs occurred when he was being treated with interferon for hepatitis B, from which he is now in remission. He has been gainfully employed at [employer] for several years and has had health insurance. In 2011 we changed hm to a Medtronic 723 (Revel) pump and started him on the Medtronic CGM sensor. His BGs have significantly improved. 3. His last PSSG visit was on 05.22.12. His  HbA1c then was 6.7%. In the interim he has had lots of stress. His partner just graduated from his masters' degree program and the patient is trying to make a career change. He may be moving to [place] soon. Although he is supposed to be taking enalapril, 20 mg, twice daily, he often mises the second dose. 4. Pertinent Review of Systems: Constitutional: The patient feels really well. He is healthy. He has few significant complaints. Eyes: Vision is good. His last eye exam was about 9 months ago. There was no sign of diabetic eye disease at that visit. He has no significant eye complaints. Neck:  His anterior neck has not been swollen or tender for several months.  Heart: He occasionally had faster heart rate when he feels stressed. Heart rate increases with exercise or other physical activity. The patient has no complaints of irregular heat beats, chest pain, or chest pressure. Gastrointestinal: He still has some abdominal bloating. When he eats a lot gassy vegetables he has a great amount of rectal gas. Bowel movents seem normal. Hands: His hands still occasionally itch due to his papular rash.  Legs: Muscle mass and strength seem normal. There are no complaints of numbness, tingling, burning, or pain. No edema is noted. Feet:  His feet sometimes itch as well. His itching and visible fungus improved after using ketoconazole and ani-fungal spray, but have recently returned. There are no complaints of numbness, tingling, burning, or pain. No edema is noted. Hypoglycemia: This occurs occasionally and is usually "explainable". The low BGs have not  been a major issue recently. His CGM pretty much keeps him  out of trouble. He gets low BG symptoms at abut 120, so he often intentionally keeps his BGs higher than they should be.  4. BG printout: He is not having many or severe lows. If his sites stay in for longer than 3 days the BGs tend to deteriorate rapidly. BGs after Sunday brunches are quite high.  5. CGM  sensor readout: He tends to have higher BGs after higher carb meals and lower BGs after lower carb meals and/or exercise.   PAST MEDICAL, FAMILY, AND SOCIAL HISTORY:  1. 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  2. He has not been using his elliptical machine or bike very often. He does walk several days per week. 3. Primary care provider: Dr. Sharlot Gowda  ROS: Samuel Valentine not have any significant problems involving his other  body systems.  PHYSICAL EXAM: BP 143/94  Pulse 97  Wt 229 lb 1.6 oz (103.919 kg)  HbA1c is 6.7%. Constitutional: The patient looks healthy, albeit overweight. He appears physically and emotionally well.  Eyes: There is no arcus or proptosis.  Mouth: The oropharynx appears normal. The tongue appears normal. There is normal oral moisture. There is no obvious gingivitis. Neck: There are no bruits present. The thyroid gland appears enlarged. The thyroid gland is 20+ grams in size. The left lobe remains larger than the right. The consistency of the thyroid gland is firm. The thyroid gland is not tender to palpation today. Lungs: The lungs are clear. Air movement is good. Heart: The heart rhythm and rate appear normal. Heart sounds S1 and S2 are normal. I do not appreciate any pathologic heart murmurs. Abdomen: The abdominal size is enlarged. Bowel sounds are normal. The abdomen is soft and non-tender. There is no obviously palpable hepatomegaly, splenomegaly, or other masses.  Arms: Muscle mass appears appropriate for age.  Hands: There is no obvious tremor. Phalangeal and metacarpophalangeal joints appear normal. He has one papule of the skin of his left palm. Legs: Muscle mass appears appropriate for age. There is no edema.  Feet: There are no significant deformities. Dorsalis pedis pulses are 1+ bilaterally. He has 1+ tinea pedis of the soles and several fungal lesions between the toes.  Neurologic: Muscle strength is normal  for age and gender in both the upper and the lower extremities. Muscle tone appears normal. Sensation to touch is normal in the legs, but slightly decreased in the left heel.  LAB: HbA1c today is 7.1%, compared with 6.7% at last visit.  ASSESSMENT:  1. T1DM: His BG control is not as good. He has been trying to avoid hypoglycemia too intensely. 2. Hypoglycemia: relatively infrequent 3. Thyroiditis: Clinically quiescent 4. Goiter: a bit smaller, c/w less thyroiditis recently 5. Hypertension: BP is too high. He has cut back on his BP meds at the same time he has cut back on physical activity. 6. Hyperlipidemia: Lipid panel results in 2012 were good. 7. Autonomic neuropathy and tachycardia: With the deterioration in BG control, his neuropathy and tachycardia have begun to worsen. 8. Peripheral neuropathy: very mild, but worse again 9. Hepatitis B: Recent viral load studies at [facility] showed no evidence of residual disease.  PLAN:  1. Diagnostic: CMP, TFTs, urine protein, lipid panel 2. Therapeutic: Try to change pump site every three days. Use Lamisil for tinea pedis. Eat right. Exercise right. Don't try so hard to avoid hypoglycemia. 3. Patient education: We discussed life style changes at length.  4.  Follow up: FU appointment in 3 months.  Level of Service: This visit lasted in excess of 40 minutes. More than 50% of the visit was devoted to counseling.  David Stall

## 2012-01-13 NOTE — Patient Instructions (Signed)
Follow up visit in 3 months. 

## 2012-01-28 LAB — LIPID PANEL
Cholesterol: 156 mg/dL (ref 0–200)
HDL: 58 mg/dL (ref >39)
Total CHOL/HDL Ratio: 2.7 Ratio
VLDL: 25 mg/dL (ref 0–40)

## 2012-01-28 LAB — COMPREHENSIVE METABOLIC PANEL
ALT: 24 U/L (ref 0–53)
Calcium: 9.9 mg/dL (ref 8.4–10.5)
Chloride: 103 mEq/L (ref 96–112)
Potassium: 4.4 mEq/L (ref 3.5–5.3)
Sodium: 140 mEq/L (ref 135–145)
Total Bilirubin: 0.5 mg/dL (ref 0.3–1.2)

## 2012-01-28 LAB — MICROALBUMIN / CREATININE URINE RATIO: Creatinine, Urine: 118.3 mg/dL

## 2012-01-28 LAB — T4, FREE: Free T4: 0.98 ng/dL (ref 0.80–1.80)

## 2012-02-01 ENCOUNTER — Other Ambulatory Visit: Payer: Self-pay | Admitting: *Deleted

## 2012-02-01 DIAGNOSIS — E1065 Type 1 diabetes mellitus with hyperglycemia: Secondary | ICD-10-CM

## 2012-02-01 MED ORDER — ATORVASTATIN CALCIUM 10 MG PO TABS: 10.0000 mg | ORAL_TABLET | Freq: Every day | ORAL | Status: DC

## 2012-03-19 ENCOUNTER — Other Ambulatory Visit: Payer: Self-pay | Admitting: "Endocrinology

## 2012-04-13 ENCOUNTER — Encounter: Payer: Self-pay | Admitting: Family Medicine

## 2012-04-13 ENCOUNTER — Ambulatory Visit (INDEPENDENT_AMBULATORY_CARE_PROVIDER_SITE_OTHER): Payer: BC Managed Care – PPO | Admitting: Family Medicine

## 2012-04-13 ENCOUNTER — Ambulatory Visit (INDEPENDENT_AMBULATORY_CARE_PROVIDER_SITE_OTHER): Payer: BC Managed Care – PPO | Admitting: "Endocrinology

## 2012-04-13 ENCOUNTER — Encounter: Payer: Self-pay | Admitting: "Endocrinology

## 2012-04-13 VITALS — BP 130/70 | HR 90 | Wt 227.0 lb

## 2012-04-13 VITALS — BP 137/78 | HR 94 | Wt 226.0 lb

## 2012-04-13 DIAGNOSIS — E1049 Type 1 diabetes mellitus with other diabetic neurological complication: Secondary | ICD-10-CM

## 2012-04-13 DIAGNOSIS — E063 Autoimmune thyroiditis: Secondary | ICD-10-CM

## 2012-04-13 DIAGNOSIS — E1143 Type 2 diabetes mellitus with diabetic autonomic (poly)neuropathy: Secondary | ICD-10-CM

## 2012-04-13 DIAGNOSIS — E1142 Type 2 diabetes mellitus with diabetic polyneuropathy: Secondary | ICD-10-CM

## 2012-04-13 DIAGNOSIS — B353 Tinea pedis: Secondary | ICD-10-CM

## 2012-04-13 DIAGNOSIS — R000 Tachycardia, unspecified: Secondary | ICD-10-CM

## 2012-04-13 DIAGNOSIS — E1065 Type 1 diabetes mellitus with hyperglycemia: Secondary | ICD-10-CM

## 2012-04-13 DIAGNOSIS — I10 Essential (primary) hypertension: Secondary | ICD-10-CM

## 2012-04-13 DIAGNOSIS — A630 Anogenital (venereal) warts: Secondary | ICD-10-CM

## 2012-04-13 DIAGNOSIS — E11649 Type 2 diabetes mellitus with hypoglycemia without coma: Secondary | ICD-10-CM

## 2012-04-13 DIAGNOSIS — G909 Disorder of the autonomic nervous system, unspecified: Secondary | ICD-10-CM

## 2012-04-13 DIAGNOSIS — E1042 Type 1 diabetes mellitus with diabetic polyneuropathy: Secondary | ICD-10-CM

## 2012-04-13 DIAGNOSIS — E049 Nontoxic goiter, unspecified: Secondary | ICD-10-CM

## 2012-04-13 DIAGNOSIS — E1169 Type 2 diabetes mellitus with other specified complication: Secondary | ICD-10-CM

## 2012-04-13 DIAGNOSIS — E1149 Type 2 diabetes mellitus with other diabetic neurological complication: Secondary | ICD-10-CM

## 2012-04-13 LAB — POCT GLYCOSYLATED HEMOGLOBIN (HGB A1C): Hemoglobin A1C: 6.4

## 2012-04-13 NOTE — Patient Instructions (Signed)
Follow up visit in 3 months. Keep up the good work.  

## 2012-04-13 NOTE — Progress Notes (Signed)
CC: FU T1DM, hypoglycemia, hypertension, hyperlipidemia, autonomic neuropathy, tachycardia, gastroparesis, peripheral neuropathy, thyroiditis, hepatitis B, fatigue  HPI: Samuel Valentine is a 41 y.o. Caucasian gentleman. He was unaccompanied.   1. Samuel Valentine was diagnosed with T2DM about 2002-2003. He was treated with metformin, Avandia, and Amaryl, but BGs were not well controlled, so Lantus was begun in 2005. When it became apparent that he would need a multiple daily injection regimen with Lantus and Novolog, he was referred to me by [name], RN, CDE, of the [facility]. I saw him for the first time on 05.26.06. He had just been started on Novolog aspart insulin at meals (120/25/10 plan). His PMH included recurrent hypoglycemia, hypertension, dyslipidemia, and proteinuria. He had had oral surgery in the past, but no other surgery. His psychiatric diagnoses included depression, anxiety, and dissociative identity disorder. He was also allergic to penicillin.  SH included the fact that he was gay. All HIV tests had been negative through that time.  He had recently been laid off, had no health insurance. In March 2006, his HbA1c had been 10.6%. On exam he had a goiter and mild peripheral neuropathy. 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 We accepted him as a charity patient and enrolled him in our Diabetes Survival Skills Program. He was converted to a Medtronic 508 insulin pump in August 2006.  2. During the next five years his DM self-care efforts and HbA1c values waxed and waned, with A1c values ranging from 7.0-9.2%. His worst BGs occurred when he was being treated with interferon for hepatitis B, from which he is now in remission. He has been gainfully employed at [employer] for several years and has had health insurance. In 2011 we changed him to a Medtronic 723 (Revel) pump and started him on the Medtronic CGM sensor. Since then  his BGs have significantly improved. 3. His last PSSG visit was on 04.25.13. His HbA1c then was 7.1%. In the interim he has had a lot of job stress since starting his new job at [employer] in May. He and his partner will be married in  [place] in November. They will re-locate to [place] soon. He is taking 10 mg of enalapril twice daily and 10 mg of atorvastatin once daily.  4. Pertinent Review of Systems: Constitutional: The patient feels pretty good. He is healthy. He has few significant complaints. Eyes: Vision is good. His last eye exam was in August 2012. There were no sign of diabetic eye disease at that visit. He has no significant eye complaints. Neck:  His anterior neck has not been swollen or tender for several months.  Heart: He occasionally has faster heart rate when he feels stressed. Heart rate increases with exercise or other physical activity. The patient has no complaints of irregular heat beats, chest pain, or chest pressure. Gastrointestinal: He does not have abdominal bloating very much anymore. Bowel movents seem normal. Hands: His hand rash and itching have improved with anti-fungal cream.  Legs: Muscle mass and strength seem normal. There are no complaints of numbness, tingling, burning, or pain. No edema is noted.  Feet:  His rash has improved with antifungal cream. There are no complaints of numbness, tingling, burning, or pain. No edema is noted. Hypoglycemia: Low BGs have not  been a major issue recently. His CGM pretty much keeps him out of trouble. He also had a Medtronic app that reminds him to change his site every  3 days. He often intentionally keeps his BGs higher than they should be in order to prevent lows.  4. BG printout: He changes sites every 3-5 days. He often has problems with new sites.  He is not having many or severe lows. If his sites stay in for longer than 3 days the BGs tend to deteriorate rapidly. BGs after Sunday brunches are quite high. BGs after Friday  hikes drop low rapidly.  5. CGM sensor readout: His average skin glucose values are about 175-181. His trough BGs are at noontime. His peak BGs are 4 PM. He tends to have higher BGs after higher carb meals and lower BGs after lower carb meals and/or exercise.   PAST MEDICAL, FAMILY, AND SOCIAL HISTORY:  1. He is now working at [employer]. His commute is much longer and more stressful than before.   2. He is walking more frequently and for longer periods of time. .  3. Primary care provider: Dr. Sharlot Gowda  ROS: Samuel Valentine not have any significant problems involving his other  body systems.  PHYSICAL EXAM: BP 137/78  Pulse 94  Wt 226 lb (102.513 kg)  HbA1c is 6.4%, compared with 7.1% at last visit. Constitutional: The patient looks healthy, albeit overweight. He appears physically and emotionally well. He is very excited about getting married and really wanted to share his good news with [place] today, people who like him and accept him for who he is.  Eyes: There is no arcus or proptosis.  Mouth: The oropharynx appears normal. The tongue appears normal. There is normal oral moisture. There is no obvious gingivitis. Neck: There are no bruits present. The thyroid gland appears enlarged. The thyroid gland is 20+ grams in size. The left lobe remains larger than the right. The consistency of the thyroid gland is firm. The thyroid gland is tender bilaterally, more tender on the left.  Lungs: The lungs are clear. Air movement is good. Heart: The heart rhythm and rate appear normal. Heart sounds S1 and S2 are normal. I do not appreciate any pathologic heart murmurs. Abdomen: The abdomen is enlarged. Bowel sounds are normal. The abdomen is soft and non-tender. There is no obviously palpable hepatomegaly, splenomegaly, or other masses.  Arms: Muscle mass appears appropriate for age.  Hands: There is no obvious tremor. Phalangeal and metacarpophalangeal joints appear normal.  Legs: Muscle mass appears  appropriate for age. There is no edema.  Feet: There are no significant deformities. Dorsalis pedis pulses are 1+ bilaterally. His feet look much, much better. He has only trace-to- 1+ tinea pedis of the soles and intertriginous skin between the toes.  Neurologic: Muscle strength is normal for age and gender in both the upper and the lower extremities. Muscle tone appears normal. Sensation to touch is normal in the legs, but slightly decreased in the left heel.  LAB: HbA1c today is 6.4%, compared with 7.1% at last visit and 6.7% in May 2012.  Labs 02/03/12: CMP: Normal; TSH 1.565, free T4 0.98, free T3 3.0; cholesterol 156, triglycerides 125, HDL 58, LDL 73; Urinary microalbumin/creatinine ratio 4.2.  ASSESSMENT:  1. T1DM: His BG control is very good. He has not been trying to avoid hypoglycemia as intensely. 2. Hypoglycemia: relatively infrequent 3. Thyroiditis: Clinically active today. 4. Goiter: a bit smaller, c/w less thyroiditis until recently 5. Hypertension: BP is better since he has been taking his enalapril regularly and has been exercising more.  6. Hyperlipidemia: Lipid panel results in 2012 were good. 7. Autonomic  neuropathy and tachycardia: With the improvement in BG control, his neuropathy and tachycardia have improved. 8. Peripheral neuropathy: He is asymptomatic today.   PLAN:  1. Diagnostic: None 2. Therapeutic: Try to change pump site every three days. Use Lamisil for tinea pedis. Eat right. Exercise right. Don't try so hard to avoid hypoglycemia. Make small adjustments in basal rates as needed. 3. Patient education: We discussed life style changes at length.  4. Follow up: FU appointment in 3 months.  Level of Service: This visit lasted in excess of 40 minutes. More than 50% of the visit was devoted to counseling.  David Stall

## 2012-04-13 NOTE — Progress Notes (Signed)
  Subjective:    Patient ID: Samuel Valentine, male    DOB: 1971-08-14, 41 y.o.   MRN: [id number]  HPI He is here for evaluation of 2 lesions that he has noted in the parirectal area. He has a previous history of difficulty with anal warts.   Review of Systems     Objective:   Physical Exam Exam of the anal area does show 2 warts present in the perianal area. The anus showed no lesions.       Assessment & Plan:   1. Anal wart    the warts were injected with Xylocaine and hyfrecated without difficulty.

## 2012-04-14 DIAGNOSIS — E1143 Type 2 diabetes mellitus with diabetic autonomic (poly)neuropathy: Secondary | ICD-10-CM | POA: Insufficient documentation

## 2012-04-14 DIAGNOSIS — E1042 Type 1 diabetes mellitus with diabetic polyneuropathy: Secondary | ICD-10-CM | POA: Insufficient documentation

## 2012-04-14 DIAGNOSIS — E11649 Type 2 diabetes mellitus with hypoglycemia without coma: Secondary | ICD-10-CM | POA: Insufficient documentation

## 2012-04-14 DIAGNOSIS — R000 Tachycardia, unspecified: Secondary | ICD-10-CM | POA: Insufficient documentation

## 2012-08-25 ENCOUNTER — Other Ambulatory Visit: Payer: Self-pay | Admitting: "Endocrinology

## 2012-09-05 ENCOUNTER — Ambulatory Visit (INDEPENDENT_AMBULATORY_CARE_PROVIDER_SITE_OTHER): Payer: BC Managed Care – PPO | Admitting: "Endocrinology

## 2012-09-05 ENCOUNTER — Encounter: Payer: Self-pay | Admitting: "Endocrinology

## 2012-09-05 VITALS — BP 127/71 | HR 94 | Wt 217.5 lb

## 2012-09-05 DIAGNOSIS — B1910 Unspecified viral hepatitis B without hepatic coma: Secondary | ICD-10-CM

## 2012-09-05 DIAGNOSIS — E11649 Type 2 diabetes mellitus with hypoglycemia without coma: Secondary | ICD-10-CM

## 2012-09-05 DIAGNOSIS — I10 Essential (primary) hypertension: Secondary | ICD-10-CM

## 2012-09-05 DIAGNOSIS — G909 Disorder of the autonomic nervous system, unspecified: Secondary | ICD-10-CM

## 2012-09-05 DIAGNOSIS — E063 Autoimmune thyroiditis: Secondary | ICD-10-CM

## 2012-09-05 DIAGNOSIS — E1169 Type 2 diabetes mellitus with other specified complication: Secondary | ICD-10-CM

## 2012-09-05 DIAGNOSIS — R000 Tachycardia, unspecified: Secondary | ICD-10-CM

## 2012-09-05 DIAGNOSIS — E1049 Type 1 diabetes mellitus with other diabetic neurological complication: Secondary | ICD-10-CM

## 2012-09-05 DIAGNOSIS — E1042 Type 1 diabetes mellitus with diabetic polyneuropathy: Secondary | ICD-10-CM

## 2012-09-05 DIAGNOSIS — E1065 Type 1 diabetes mellitus with hyperglycemia: Secondary | ICD-10-CM

## 2012-09-05 DIAGNOSIS — E1043 Type 1 diabetes mellitus with diabetic autonomic (poly)neuropathy: Secondary | ICD-10-CM

## 2012-09-05 DIAGNOSIS — E049 Nontoxic goiter, unspecified: Secondary | ICD-10-CM

## 2012-09-05 DIAGNOSIS — E785 Hyperlipidemia, unspecified: Secondary | ICD-10-CM

## 2012-09-05 LAB — GLUCOSE, POCT (MANUAL RESULT ENTRY): POC Glucose: 217 mg/dl — AB (ref 70–99)

## 2012-09-05 LAB — POCT GLYCOSYLATED HEMOGLOBIN (HGB A1C): Hemoglobin A1C: 7.3

## 2012-09-05 NOTE — Patient Instructions (Signed)
Follow up visit in 3 months. Continue current medications as is.

## 2012-09-05 NOTE — Progress Notes (Signed)
CC: FU T1DM, hypoglycemia, hypertension, hyperlipidemia, autonomic neuropathy, tachycardia, gastroparesis, peripheral neuropathy, thyroiditis, goiter, hepatitis B, fatigue  HPI: Samuel Valentine is a 41 y.o. Caucasian gentleman. He was unaccompanied.   1. Samuel Valentine was diagnosed with T2DM about 2002-2003. He was treated with metformin, Avandia, and Amaryl, but BGs were not well controlled, so Lantus was begun in 2005. When it became apparent that he would need a multiple daily injection regimen with Lantus and Novolog, he was referred to me by [name], RN, CDE, of the [facility]. I saw him for the first time on 02/12/05. He had just been started on Novolog aspart insulin at meals (120/25/10 plan). His PMH included recurrent hypoglycemia, hypertension, dyslipidemia, and proteinuria. He had had oral surgery in the past, but no other surgery. His psychiatric diagnoses included depression, anxiety, and dissociative identity disorder. He was also allergic to penicillin.  SH included the fact that he was gay. All HIV tests had been negative through that time.  He had recently been laid off and had no health insurance. In March 2006, his HbA1c had been 10.6%. On exam he had a goiter and mild peripheral neuropathy. 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 We accepted him as a charity patient and enrolled him in our Diabetes Survival Skills Program. He was converted to a Medtronic 508 insulin pump in August 2006.  2. During the next seven years his DM self-care efforts and HbA1c values waxed and waned, with A1c values ranging from 6.4%-9.2%. His worst BGs occurred when he was being treated with interferon for hepatitis B, from which he is now in remission. He has been gainfully employed at [employer] for several years and has had health insurance. In 2011 we changed him to a Medtronic 723 (Revel) pump and started him on the Medtronic CGM sensor.  Since then his BGs have significantly improved. 3. His last PSSG visit was on 04/13/12. His HbA1c then was 6.4%. In the interim he has not been exercising as much, he has not been eating right as often, and his BG control has not been as good.  He uses Apidra in his insulin pump. He is taking 10 mg of enalapril twice daily and 10 mg of atorvastatin once daily. He also take Centrum Silver and two 81 mg ASA per day. 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 There was no evidence for active virus in his body. He will remain on Baraclude indefinitely. 4. Pertinent Review of Systems: Constitutional: The patient feels "pretty good". He is healthy. He has few significant complaints. Eyes: Vision is good. His last eye exam was in August 2013. There were no sign of diabetic eye disease at that visit. He has no significant eye complaints. Neck:  His anterior neck has not been swollen or tender for several months.  Heart: He occasionally has faster heart rate when he feels stressed. Heart rate increases with exercise or other physical activity. The patient has no complaints of irregular heat beats, chest pain, or chest pressure. Gastrointestinal: He does not have abdominal bloating very much anymore. Bowel movents seem normal. Hands: His hand rash and itching resolved after using the  Lamisil anti-fungal cream.  Legs: Muscle mass and strength seem normal. There are no complaints of numbness, tingling, burning, or pain. No edema is noted.  Feet:  His foot rash resolved after using the  antifungal cream. There are no complaints of numbness, tingling, burning, or pain. No edema is noted. Hypoglycemia: Low BGs occur every 3-4 days, usually associated with exercise. When he feels low he does not check his BG, but takes glucose immediately. His CGM pretty much keeps him out of trouble. He has not always been taking the advice of his Medtronic app that reminds  him to change his site every 3 days. He often intentionally keeps his BGs higher than they should be in order to prevent lows.  4. BG printout: He changes sites every 3-5 days. He often has problems with new sites.  He is not having any documented low BGs. If his sites stay in for longer than 3 days the BGs tend to deteriorate rapidly. He misses many opportunities to check BGs and to take correction boluses. He often gives a correction bolus based upon his CGM value rather than checking BGs.  5. CGM sensor readout: Not available.   PAST MEDICAL, FAMILY, AND SOCIAL HISTORY:  1. 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 He and his partner still live in [place]. They were married in November in [place].   2. He is walking more frequently and for longer periods of time. He has been trying to lose weight.  3. Primary care provider: Dr. Sharlot Gowda  REVIEW OF SYSTEMS: Samuel Valentine does not have any significant problems involving his other body systems.  PHYSICAL EXAM: BP 127/71  Pulse 94  Wt 217 lb 8 oz (98.657 kg)  He has lost 9 pounds since last visit. He has been trying to lose weight. He is letting his hair grow out a bit. He looks great. Constitutional: The patient looks healthy and slimmer. He appears physically and emotionally well. He is very happy about being married.  Eyes: There is no arcus or proptosis.  Mouth: The oropharynx appears normal. The tongue appears normal. There is normal oral moisture. There is no obvious gingivitis. Neck: There are no bruits present. The thyroid gland appears enlarged. The thyroid gland is 20+ grams in size. The left lobe remains larger than the right. The consistency of the thyroid gland is relatively firm. The thyroid gland is tender on the left.  Lungs: The lungs are clear. Air movement is good. Heart: The heart rhythm and rate appear normal. Heart sounds S1 and S2 are normal. I do not appreciate any pathologic heart  murmurs. Abdomen: The abdomen is enlarged. Bowel sounds are normal. The abdomen is soft and non-tender. There is no obviously palpable hepatomegaly, splenomegaly, or other masses.  Arms: Muscle mass appears appropriate for age.  Hands: There is no obvious tremor. Phalangeal and metacarpophalangeal joints appear normal.  Legs: Muscle mass appears appropriate for age. There is no edema.  Feet: There are no significant deformities. Dorsalis pedis pulses are 1+ bilaterally. His feet look much, much better. He has no obvious tinea pedis of the feet today.  Neurologic: Muscle strength is normal for age and gender in both the upper and the lower extremities. Muscle tone appears normal. Sensation to touch is normal in the legs, but slightly decreased in the left heel.  LAB: HbA1c today is 7.3%, compared with 6.4% at last visit, and with 7.1% at the prior visit.  Labs 02/03/12: CMP: Normal; TSH 1.565, free T4 0.98, free T3 3.0; cholesterol 156, triglycerides 125, HDL 58, LDL 73; Urinary microalbumin/creatinine ratio 4.2.  ASSESSMENT:  1. T1DM: His BG control is not as good  as at last visit. He has not had as many low BGs as he was having. He also tends to give correction boluses based upon CGM values rather than on BG values. .  2. Hypoglycemia: He is not having as many low BGs. Some low BGs occur when he did not check BGs at a meal prior, but dosed off of his CGM instead.  3. Thyroiditis: Clinically active today. 4. Goiter: Essentially unchanged in size.  5. Hypertension: BP is normal since he has been taking his enalapril regularly and has been exercising more.  6. Hyperlipidemia: Lipid panel results in 2012 were good. 7. Autonomic neuropathy and tachycardia: With the improvement in BG control, his neuropathy and tachycardia have improved. 8. Peripheral neuropathy: He is asymptomatic today and only minimally active on physical exam.   PLAN:  1. Diagnostic: Annual surveillance labs two weeks prior to  next visit.  2. Therapeutic: Try to change pump site every three days. Continue Lamisil for tinea pedis. Continue enalapril and atorvastatin. Eat right. Exercise right. Don't try so hard to avoid hypoglycemia. Make small adjustments in basal rates as needed. 3. Patient education: We discussed life style changes at length. He is in this for the long haul.  4. Follow up: FU appointment in 3 months.  Level of Service: This visit lasted in excess of 40 minutes. More than 50% of the visit was devoted to counseling.  David Stall

## 2012-09-11 ENCOUNTER — Other Ambulatory Visit: Payer: Self-pay | Admitting: "Endocrinology

## 2012-09-28 ENCOUNTER — Other Ambulatory Visit: Payer: Self-pay | Admitting: "Endocrinology

## 2012-09-28 DIAGNOSIS — E1065 Type 1 diabetes mellitus with hyperglycemia: Secondary | ICD-10-CM

## 2012-11-04 ENCOUNTER — Other Ambulatory Visit: Payer: Self-pay

## 2012-11-05 ENCOUNTER — Other Ambulatory Visit: Payer: Self-pay | Admitting: "Endocrinology

## 2012-11-05 DIAGNOSIS — E1065 Type 1 diabetes mellitus with hyperglycemia: Secondary | ICD-10-CM

## 2012-11-15 ENCOUNTER — Other Ambulatory Visit: Payer: Self-pay | Admitting: *Deleted

## 2012-11-22 ENCOUNTER — Encounter: Payer: Self-pay | Admitting: Family Medicine

## 2012-11-22 ENCOUNTER — Ambulatory Visit (INDEPENDENT_AMBULATORY_CARE_PROVIDER_SITE_OTHER): Payer: BC Managed Care – PPO | Admitting: Family Medicine

## 2012-11-22 VITALS — BP 120/76 | HR 72 | Ht 73.0 in | Wt 221.0 lb

## 2012-11-22 MED ORDER — VALACYCLOVIR HCL 1 G PO TABS: ORAL_TABLET | ORAL | Status: DC

## 2012-11-22 NOTE — Progress Notes (Signed)
  Subjective:    Patient ID: Samuel Valentine, male    DOB: 1971/03/05, 42 y.o.   MRN: [id number]  HPI He is here for evaluation and treatment of 2 lesions present in the anal area. He has a previous history of difficulty with warts. He also has a lesion present on his mid lower lip.   Review of Systems     Objective:   Physical Exam  exam of his mid lower lip does show the 1 cm area of erythema  Anal exam does show 2 warts. One is at 5:00 the other one is superior to the anus at around 12:00.        Assessment & Plan:  Herpes labialis - Plan: valACYclovir (VALTREX) 1000 MG tablet  Anal warts explained how to use the Valtrex for his lip lesion. He verbalized understanding. The and the warts were hyfrecated without difficulty.

## 2012-12-02 ENCOUNTER — Other Ambulatory Visit: Payer: Self-pay | Admitting: "Endocrinology

## 2012-12-06 LAB — COMPREHENSIVE METABOLIC PANEL
Albumin: 4.5 g/dL (ref 3.5–5.2)
BUN: 9 mg/dL (ref 6–23)
Calcium: 9.4 mg/dL (ref 8.4–10.5)
Chloride: 103 mEq/L (ref 96–112)
Glucose, Bld: 123 mg/dL — ABNORMAL HIGH (ref 70–99)
Potassium: 4.2 mEq/L (ref 3.5–5.3)

## 2012-12-06 LAB — T3, FREE: T3, Free: 3.1 pg/mL (ref 2.3–4.2)

## 2012-12-06 LAB — T4, FREE: Free T4: 0.92 ng/dL (ref 0.80–1.80)

## 2012-12-06 LAB — LIPID PANEL
HDL: 47 mg/dL (ref >39)
Triglycerides: 74 mg/dL (ref <150)

## 2012-12-06 LAB — HEMOGLOBIN A1C: Mean Plasma Glucose: 183 mg/dL — ABNORMAL HIGH (ref <117)

## 2012-12-07 ENCOUNTER — Ambulatory Visit (INDEPENDENT_AMBULATORY_CARE_PROVIDER_SITE_OTHER): Payer: BC Managed Care – PPO | Admitting: "Endocrinology

## 2012-12-07 ENCOUNTER — Encounter: Payer: Self-pay | Admitting: "Endocrinology

## 2012-12-07 VITALS — BP 114/77 | HR 74 | Wt 222.0 lb

## 2012-12-07 DIAGNOSIS — E1142 Type 2 diabetes mellitus with diabetic polyneuropathy: Secondary | ICD-10-CM

## 2012-12-07 DIAGNOSIS — I10 Essential (primary) hypertension: Secondary | ICD-10-CM

## 2012-12-07 DIAGNOSIS — I499 Cardiac arrhythmia, unspecified: Secondary | ICD-10-CM

## 2012-12-07 DIAGNOSIS — E063 Autoimmune thyroiditis: Secondary | ICD-10-CM

## 2012-12-07 DIAGNOSIS — IMO0002 Reserved for concepts with insufficient information to code with codable children: Secondary | ICD-10-CM

## 2012-12-07 DIAGNOSIS — E1169 Type 2 diabetes mellitus with other specified complication: Secondary | ICD-10-CM

## 2012-12-07 DIAGNOSIS — E7800 Pure hypercholesterolemia, unspecified: Secondary | ICD-10-CM

## 2012-12-07 DIAGNOSIS — E049 Nontoxic goiter, unspecified: Secondary | ICD-10-CM

## 2012-12-07 DIAGNOSIS — E1065 Type 1 diabetes mellitus with hyperglycemia: Secondary | ICD-10-CM

## 2012-12-07 DIAGNOSIS — E1149 Type 2 diabetes mellitus with other diabetic neurological complication: Secondary | ICD-10-CM

## 2012-12-07 DIAGNOSIS — G909 Disorder of the autonomic nervous system, unspecified: Secondary | ICD-10-CM

## 2012-12-07 DIAGNOSIS — E1049 Type 1 diabetes mellitus with other diabetic neurological complication: Secondary | ICD-10-CM

## 2012-12-07 LAB — MICROALBUMIN / CREATININE URINE RATIO: Microalb, Ur: 0.61 mg/dL (ref 0.00–1.89)

## 2012-12-07 NOTE — Progress Notes (Signed)
CC: FU T1DM, hypoglycemia, hypertension, hyperlipidemia, autonomic neuropathy, tachycardia, gastroparesis, peripheral neuropathy, thyroiditis, goiter, hepatitis B, fatigue  HPI: Samuel Valentine is a 42 y.o. Caucasian gentleman. He was unaccompanied.   1. Mr. Scull was diagnosed with T2DM about 2002-2003. He was treated with metformin, Avandia, and Amaryl, but BGs were not well controlled, so Lantus was begun in 2005. When it became apparent that he would need a multiple daily injection regimen with Lantus and Novolog, he was referred to me by [name], RN, CDE, of the [facility]. I saw him for the first time on 02/12/05. He had just been started on Novolog aspart insulin at meals (120/25/10 plan). His PMH included recurrent hypoglycemia, hypertension, dyslipidemia, and proteinuria. He had had oral surgery in the past, but no other surgery. His psychiatric diagnoses included depression, anxiety, and dissociative identity disorder. He was also allergic to penicillin.  Social history included the fact that he was gay. All HIV tests had been negative through that time.  He had recently been laid off and had no health insurance. In March 2006, his HbA1c had been 10.6%. On exam he had a goiter and mild peripheral neuropathy. 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 We accepted him as a charity patient and enrolled him in our Diabetes Survival Skills Program. He was converted to a Medtronic 508 insulin pump in August 2006.   2. During the next seven years his DM self-care efforts and HbA1c values waxed and waned, with A1c values ranging from 6.4%-9.2%. His worst BGs occurred when he was being treated with interferon for hepatitis B, from which he is now in remission. He has been gainfully employed at [employer], [employer], and again at [employer] for several years and has had health insurance. In 2011 we changed him to a Medtronic 723 (Revel) pump and  started him on the Medtronic CGM sensor. Since then his BGs have significantly improved.  3. His last PSSG visit was on 09/05/12. His HbA1c then was 7.3%, increased from 6.4% at the previous visit. In the interim he has again not been exercising much, he has again not been eating right at times, and his BG control has again not been as good as it could be.  He has been much better at changing sites every 3-4 days. He uses Apidra in his insulin pump. He wears the sensor most of the time, but gets "weary" of having to calibrate the sensor. He is taking 10 mg of enalapril twice daily and 10 mg of atorvastatin once daily. He also take Centrum Silver and two 81 mg ASA per day. He has had dental work several times. He cut his finger last week and had a tetanus shot then.  4. Pertinent Review of Systems: Constitutional: The patient feels "pretty decent, but really exhausted and stressed at work". He is healthy. He has few significant complaints. Eyes: Vision is good. His last eye exam was in August 2013. There were no sign of diabetic eye disease at that visit. He has no significant eye complaints. Neck:  His anterior neck has not been tender for several months, but there are times when the thyroid gland seems to be more swollen than at other times.  Heart: He occasionally has faster heart rate when his BGs are high. Heart rate increases with exercise or other physical activity. The patient has no complaints of irregular heat beats, chest pain, or chest pressure. Gastrointestinal: He does have "  a little bit" of abdominal bloating. Bowel movents seem normal. Hands: His hand rash and itching resolved after using the  Lamisil anti-fungal cream.  Legs: Muscle mass and strength seem normal. There are no complaints of numbness, tingling, burning, or pain. No edema is noted.  Feet: His foot rash resolved after using the antifungal cream. He continues to apply Lamasil as a preventive measure. There are no complaints of  numbness, tingling, burning, or pain. No edema is noted. Hypoglycemia: Low BGs occur every 3-4 days, usually associated with either over-correcting higher BGs or with not taking the time to attend to his BGs when they drop rapidly. When he feels low he does not check his BG, but takes glucose immediately. His CGM pretty keeps him out of trouble if he pays attention to the prompts. He still sometimes intentionally keeps his BGs higher than they should be in order to prevent lows.   5 . BG printout: He changes sites every 3-4 days. He often has problems with new sites.  He is having some 50s and 60s, mostly after correcting a BG > 250. If his sites stay in for longer than 3 days the BGs tend to deteriorate rapidly. He checks his BGS 3-7 times per day.  He also boluses 3-8 times daily. Some carb counts are a lot better than others. He still sometimes gives a correction bolus based upon his CGM value rather than checking BGs.   6. CGM sensor readout: His CGM shows an average BG of about 190. He still has a great deal of variability, mostly related to eating out at restaurants or not managing portion control very well. In general the sensor readings match his CBGs very well. There were only two times in the past 4 weeks in which the sensor and BG tests were not congruent, once when his BG dropped very rapidly and once when his BG increased fairly rapidly.  PAST MEDICAL, FAMILY, AND SOCIAL HISTORY:  1. 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 He and his partner live in [place]. 2. He is not walking often.  3. Primary care provider: Dr. Sharlot Gowda  REVIEW OF SYSTEMS: Mr. Niehoff does not have any significant problems involving his other body systems.  PHYSICAL EXAM: BP 114/77  Pulse 74  Wt 222 lb (100.699 kg)  BMI 29.3 kg/m2  He has re-gained 5 pounds since last visit. He looks good. Constitutional: The patient looks healthy, but heavier. He appears physically and emotionally well. He is  very happy about being married.  Eyes: There is no arcus or proptosis.  Mouth: The oropharynx appears normal. The tongue appears normal. There is normal oral moisture. There is no obvious gingivitis. Neck: There are no bruits present. The thyroid gland appears enlarged. The thyroid gland is 20-25 grams in size. Both lobes are enlarged. The consistency of the thyroid gland is relatively firm. The thyroid gland is tender on the left again today.  Lungs: The lungs are clear. Air movement is good. Heart: The heart rhythm and rate appear normal. Heart sounds S1 and S2 are normal. I do not appreciate any pathologic heart murmurs. Abdomen: The abdomen is enlarged. Bowel sounds are normal. The abdomen is soft and non-tender. There is no obviously palpable hepatomegaly, splenomegaly, or other masses.  Arms: Muscle mass appears appropriate for age.  Hands: There is no obvious tremor. Phalangeal and metacarpophalangeal joints appear normal.  Legs: Muscle mass appears appropriate for age. There is no edema.  Feet:  There are no significant deformities. Dorsalis pedis pulses are 2+ on the right and 1+ on the left. His feet look good. He has no obvious tinea pedis of the feet today.  Neurologic: Muscle strength is normal for age and gender in both the upper and the lower extremities. Muscle tone appears normal. Sensation to touch is normal in the legs, but slightly decreased in the left heel.  LAB: HbA1c today is 8.0%, compared with 7.3% at last visit and with  6.4% at the visit prior.  Labs 12/06/12: CMP shows normal AST and ALT. Cholesterol 136, triglycerides 74, HDL 47 (decreased from 58 when he was exercising), LDL 74; microalbumin/creatinine ratio 2.3; TSH 1.782, free T4 0.98, free T3 3.1 Labs 02/03/12: CMP: Normal; TSH 1.565, free T4 0.98, free T3 3.0; cholesterol 156, triglycerides 125, HDL 58, LDL 73; Urinary microalbumin/creatinine ratio 4.2.  ASSESSMENT:  1. T1DM: His BG control is worse than it was at  last visit, the worst it has been for more than one year. He took somewhat of a break from DM over the winter. He needs to get back on track. It is clear that when his sites stay in for more than three days, BGs tend to rise rapidly. He still tends to give correction boluses based upon CGM values rather than on BG values, but that works well most of the time. .  2. Hypoglycemia: He is not having as many low BGs. Some low BGs occur when he over-corrected higher BGs. Some lows occur if he is delayed in eating.  3. Thyroiditis: Clinically active today, c/w ongoing thyroiditis.. 4. Goiter: The thyroid gland is somewhat larger today. The waxing and waning of thyroid gland size is also c/w evolving Hashimoto's disease.  He remains euthyroid.  5. Hypertension: SBP is normal since he has been taking his enalapril regularly. DBP is slightly increased. He does need to exercise more regularly.   6. Hyperlipidemia: Lipid panel results in 2012 and again this month were very good. 7. Autonomic neuropathy, tachycardia, and gastroparesis: With the improvement in BG control he was having, his neuropathy, tachycardia, and gastroparesis have all improved. However, if he continues to have higher BGs, the neuropathy and tachycardia will worsen again.  8. Peripheral neuropathy: He is asymptomatic today. He has only minimally evidence of neuropathy on physical exam.   PLAN:  1. Diagnostic: HbA1c today.   2. Therapeutic: Try to change pump site every three days. Continue Lamisil for tinea pedis. Continue enalapril and atorvastatin. Eat right. Exercise right. Don't try so hard to avoid hypoglycemia. Make small adjustments in basal rates as needed. Be prepared to reduce basal rates and/or to use temporary basal rates when exercising.  3. Patient education: We discussed life style changes at length. He sometimes tires of having DM, but he is in this for the long haul.  4. Follow up: FU appointment in 3 months.  Level of  Service: This visit lasted in excess of 50 minutes. More than 50% of the visit was devoted to counseling.  David Stall

## 2012-12-07 NOTE — Patient Instructions (Signed)
Follow up visit in 3 months. Get back on track.

## 2013-02-03 ENCOUNTER — Other Ambulatory Visit: Payer: Self-pay | Admitting: "Endocrinology

## 2013-04-02 ENCOUNTER — Ambulatory Visit (INDEPENDENT_AMBULATORY_CARE_PROVIDER_SITE_OTHER): Payer: BC Managed Care – PPO | Admitting: "Endocrinology

## 2013-04-02 ENCOUNTER — Encounter: Payer: Self-pay | Admitting: "Endocrinology

## 2013-04-02 VITALS — BP 118/68 | HR 69 | Wt 217.0 lb

## 2013-04-02 DIAGNOSIS — R000 Tachycardia, unspecified: Secondary | ICD-10-CM

## 2013-04-02 DIAGNOSIS — E1143 Type 2 diabetes mellitus with diabetic autonomic (poly)neuropathy: Secondary | ICD-10-CM

## 2013-04-02 DIAGNOSIS — G909 Disorder of the autonomic nervous system, unspecified: Secondary | ICD-10-CM

## 2013-04-02 DIAGNOSIS — E1065 Type 1 diabetes mellitus with hyperglycemia: Secondary | ICD-10-CM

## 2013-04-02 DIAGNOSIS — K3184 Gastroparesis: Secondary | ICD-10-CM

## 2013-04-02 DIAGNOSIS — E782 Mixed hyperlipidemia: Secondary | ICD-10-CM

## 2013-04-02 DIAGNOSIS — E049 Nontoxic goiter, unspecified: Secondary | ICD-10-CM

## 2013-04-02 DIAGNOSIS — E1043 Type 1 diabetes mellitus with diabetic autonomic (poly)neuropathy: Secondary | ICD-10-CM

## 2013-04-02 DIAGNOSIS — I498 Other specified cardiac arrhythmias: Secondary | ICD-10-CM

## 2013-04-02 DIAGNOSIS — E063 Autoimmune thyroiditis: Secondary | ICD-10-CM

## 2013-04-02 DIAGNOSIS — E1149 Type 2 diabetes mellitus with other diabetic neurological complication: Secondary | ICD-10-CM

## 2013-04-02 DIAGNOSIS — E1042 Type 1 diabetes mellitus with diabetic polyneuropathy: Secondary | ICD-10-CM

## 2013-04-02 DIAGNOSIS — I10 Essential (primary) hypertension: Secondary | ICD-10-CM

## 2013-04-02 DIAGNOSIS — E1169 Type 2 diabetes mellitus with other specified complication: Secondary | ICD-10-CM

## 2013-04-02 DIAGNOSIS — E11649 Type 2 diabetes mellitus with hypoglycemia without coma: Secondary | ICD-10-CM

## 2013-04-02 DIAGNOSIS — E1049 Type 1 diabetes mellitus with other diabetic neurological complication: Secondary | ICD-10-CM

## 2013-04-02 DIAGNOSIS — F4321 Adjustment disorder with depressed mood: Secondary | ICD-10-CM

## 2013-04-02 LAB — GLUCOSE, POCT (MANUAL RESULT ENTRY): POC Glucose: 118 mg/dL — AB (ref 70–99)

## 2013-04-02 MED ORDER — INSULIN ASPART 100 UNIT/ML CARTRIDGE (PENFILL): SUBCUTANEOUS | Status: DC

## 2013-04-02 NOTE — Patient Instructions (Signed)
Follow up visit in 3 months. 

## 2013-04-02 NOTE — Progress Notes (Addendum)
CC: FU T1DM, hypoglycemia, hypertension, hyperlipidemia, autonomic neuropathy, tachycardia, gastroparesis, peripheral neuropathy, thyroiditis, goiter, hepatitis B, fatigue  HPI: [name] is a 42 y.o. Caucasian gentleman. He was unaccompanied.   1. Mr. Thrun was diagnosed with T2DM about 2002-2003. He was treated with metformin, Avandia, and Amaryl, but BGs were not well controlled, so Lantus was begun in 2005. When it became apparent that he would need a multiple daily injection regimen with Lantus and Novolog, he was referred to me by [name], RN, CDE, of the [facility]. I saw him for the first time on 02/12/05. He had just been started on Novolog aspart insulin at meals (120/25/10 plan). His PMH included recurrent hypoglycemia, hypertension, dyslipidemia, and proteinuria. He had had oral surgery in the past, but no other surgery. His psychiatric diagnoses included depression, anxiety, and dissociative identity disorder. He was also allergic to penicillin.  Social history included the fact that he was gay. All HIV tests had been negative through that time.  He had recently been laid off and had no health insurance. In March 2006, his HbA1c had been 10.6%. On exam he had a goiter and mild peripheral neuropathy. 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 We accepted him as a charity patient and enrolled him in our Diabetes Survival Skills Program. He was converted to a Medtronic 508 insulin pump in August 2006.   2. During the next eight  years his DM self-care efforts and HbA1c values waxed and waned, with A1c values ranging from 6.4%-9.2%. His worst BGs occurred when he was being treated with interferon for hepatitis B, from which he is now in remission. He has been gainfully employed at [employer], [employer], and again at [employer] for several years and has had health insurance. In 2011 we changed him to a Medtronic 723 (Revel) pump and  started him on the Medtronic CGM sensor. Since then his BGs have significantly improved.  3. His last PSSG visit was on 12/07/12. His HbA1c then was 8.0%, increased from 7.3% at the prior visit and from 6.4% about 9 months ago. In the interim he has been exercising more. He goes to the gym two nights per week and does about 30 minutes of work on the elliptical machine and 15 minutes or so on strength building. He has also been walking on many evenings. He has lost 5 ponds and several inches from his waistline. He notes that when he walks in the heat, his BGs are very variable. He has been changing sites every 3-4 days. He uses Apidra in his insulin pump. He wears the sensor most of the time. He reduced his enalapril to 10 mg once daily de to hypotension when exercising. He also takes 10 mg of atorvastatin once daily, Centrum Silver, and two 81 mg ASA per day. He continues to take [facility] as treatment for hepatitis. He may be able to taper and stop this medication in the next year. He has been eating more fruit recently.  4. Pertinent Review of Systems: Constitutional: The patient feels "pretty good". He is working "crazy hours", but enjoys his job. He is healthy. He has few significant complaints. Eyes: Vision is good. His last eye exam was on June 25th 2014. There were no sign of diabetic eye disease at that visit. He has no significant eye complaints. Neck:  His anterior neck has not been tender for several months..  Heart: He occasionally has faster heart rate  when his BGs are high. Heart rate increases with exercise or other physical activity. He has occasional chest wall pains. The patient has no other complaints of irregular heat beats, chest pain, or chest pressure. Gastrointestinal: He does have occasional abdominal bloating, usually after eating a lot of fresh fruit. Bowel movents seem normal for the most part, but he has been somewhat constipated recently. Hands: His hand rash and itching have  not recurred. He stopped using Lamisil anti-fungal cream.  Legs: Muscle mass and strength seem normal. There are no complaints of numbness, tingling, burning, or pain. No edema is noted.  Feet: His foot rash resolved after using the antifungal cream. He continues to apply Lamasil as a preventive measure about twice a week. There are no complaints of numbness, tingling, burning, or pain. No edema is noted. Hypoglycemia: Low BGs occur 3-4 times per week, usually associated with  over-correcting higher BGs, with not taking the time to attend to his BGs when they drop rapidly, or when he exercises. He has been using temporary basal rates, but really needs to start the lower rate about 30 minutes prior to exercise. Sometimes he subtracts points of BG after exercise, sometimes not. When he feels low he does not check his BG, but takes glucose immediately. His CGM keeps him out of trouble if he pays attention to the prompts. He still sometimes intentionally keeps his BGs higher than they should be in order to prevent lows.   5 . BG printout: He changes sites every 3-4 days. He has had many more good sites than bad sites. His new sites are generally working better.  He is having some 50s and 60s, mostly after exercise or correcting a BG > 250. His higher BGs occur when sites go bad on the 4th days, when he over-treats his low BGs, or when he is sick. If his sites stay in for longer than 3 days the BGs tend to deteriorate rapidly. He checks his BGS 3-7 times per day. He also boluses 3-8 times daily. Some carb counts are better than others. He still sometimes gives a correction bolus based upon his CGM value rather than checking BGs. He also sometimes just gives manual boluses. His average BG is 188.   6. CGM sensor readout: His CGM shows an average BG of about 170. He still has a great deal of variability, mostly related to eating out at restaurants, during and after exercise, or when he is not as engaged in managing  his BGS. In general the sensor readings match his CBGs very well. There was only one time in the past 4 weeks in which the sensor and BG tests were not congruent. On that event the BG was increasing more rapidly than the skin sugar.   PAST MEDICAL, FAMILY, AND SOCIAL HISTORY:  1. 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 2. He is walking more often.  3. Primary care provider: Dr. Sharlot Gowda  REVIEW OF SYSTEMS: Mr. Lazo does not have any significant problems involving his other body systems.  PHYSICAL EXAM: BP 118/68  Pulse 69  Wt 217 lb (98.431 kg)  BMI 28.64 kg/m2  He has lost 5 pounds since last visit. He looks good. Constitutional: The patient looks healthy and slimmer. He appears physically and emotionally well. His affect and engagement are very normal.  Eyes: There is no arcus or proptosis.  Mouth: The oropharynx appears normal. The tongue appears normal.  There is normal oral moisture. There is no obvious gingivitis. Neck: There are no bruits present. The thyroid gland appears slightly enlarged. The thyroid gland is smaller at 20+ grams in size. Both lobes are mildly enlarged. The consistency of the thyroid gland is relatively firm. The thyroid gland is tender on the left again today.  Lungs: The lungs are clear. Air movement is good. Heart: The heart rhythm and rate appear normal. Heart sounds S1 and S2 are normal. I do not appreciate any pathologic heart murmurs. Abdomen: The abdomen is enlarged. Bowel sounds are normal. The abdomen is soft and non-tender. There is no obviously palpable hepatomegaly, splenomegaly, or other masses. He is a bit tender in the LLQ. Arms: Muscle mass appears appropriate for age.  Hands: There is no obvious tremor. Phalangeal and metacarpophalangeal joints appear normal. His rash has resolved.  Legs: Muscle mass appears appropriate for age. There is no edema.  Feet: There are no significant deformities.  Dorsalis pedis pulses are 1+ on the right and 2+ on the left. His feet look good. He has a new plantar wart of the left mid-foot. He has no obvious tinea pedis of the feet today.  Neurologic: Muscle strength is normal for age and gender in both the upper and the lower extremities. Muscle tone appears normal. Sensation to touch is normal in the legs, but slightly decreased in the left heel.  LAB: HbA1c today is 7.2% today, compared with 8.0% at last visit and with 7.3% at prior visit.   Labs 12/06/12: CMP shows normal AST and ALT. Cholesterol 136, triglycerides 74, HDL 47 (decreased from 58 when he was exercising), LDL 74; microalbumin/creatinine ratio 2.3; TSH 1.782, free T4 0.98, free T3 3.1 Labs 02/03/12: CMP: Normal; TSH 1.565, free T4 0.98, free T3 3.0; cholesterol 156, triglycerides 125, HDL 58, LDL 73; Urinary microalbumin/creatinine ratio 4.2.  ASSESSMENT:  1. T1DM: His BG control is much better than it was at last visit, without having too much hypoglycemia. He has gotten back on track. It is clear that when his sites stay in for more than three days, BGs tend to rise rapidly. He still tends to give correction boluses based upon CGM values rather than on BG values, but that works well most of the time. Late night snacking often produces higher BGs during the night.  .  2. Hypoglycemia: He is having more low BGs. Some low BGs occur when he over-corrected higher BGs. Some lows occur if he is delayed in eating. Some low BGs occur several hours after exercise, especially if he forgot to subtract 50-100-150 points of  BG after exercise.   3. Thyroiditis: Clinically active today, c/w ongoing thyroiditis.. 4. Goiter: The thyroid gland is somewhat smaller today. The waxing and waning of thyroid gland size is also c/w evolving Hashimoto's disease.  He was euthyroid in March.  5. Hypertension: BP is normal. The combination of lower enalapril and increased exercise is working for him.  6. Hyperlipidemia:  Lipid panel results in March were good overall, although his HDL was lower after stopping exercise. His resumption of exercise should help. 7. Autonomic neuropathy, tachycardia, and gastroparesis: With the improvement in BG control he has had, his neuropathy, tachycardia, and gastroparesis have all improved.  8. Peripheral neuropathy: He is asymptomatic today. He has only minimally evidence of neuropathy on physical exam. The neuropathy is probably a combination of glucose effects and of prior damage to the sciatic nerve on the left.  9. Adjustment reaction: He is  doing very well today.   PLAN:  1. Diagnostic: HbA1c today.   2. Therapeutic: Try to change pump site every three days. Use Lamisil for tinea pedis. Continue enalapril and atorvastatin. Eat right. Exercise right. Don't try so hard to avoid hypoglycemia. Make small adjustments in basal rates as needed. Be prepared to reduce basal rates and/or to use temporary basal rates when exercising. Please subtract 50-00-50 points of BG after exercise. 3. Patient education: We discussed life style changes at length. He has been much better at taking care of his DM in the past 3 months.  4. Follow up: FU appointment in 3 months.  Level of Service: This visit lasted in excess of 50 minutes. More than 50% of the visit was devoted to counseling.  David Stall

## 2013-05-07 ENCOUNTER — Other Ambulatory Visit: Payer: Self-pay | Admitting: "Endocrinology

## 2013-07-17 ENCOUNTER — Encounter: Payer: Self-pay | Admitting: "Endocrinology

## 2013-07-17 ENCOUNTER — Ambulatory Visit (INDEPENDENT_AMBULATORY_CARE_PROVIDER_SITE_OTHER): Payer: BC Managed Care – PPO | Admitting: "Endocrinology

## 2013-07-17 VITALS — BP 125/80 | HR 81 | Wt 221.5 lb

## 2013-07-17 DIAGNOSIS — I10 Essential (primary) hypertension: Secondary | ICD-10-CM

## 2013-07-17 DIAGNOSIS — E1043 Type 1 diabetes mellitus with diabetic autonomic (poly)neuropathy: Secondary | ICD-10-CM

## 2013-07-17 DIAGNOSIS — I498 Other specified cardiac arrhythmias: Secondary | ICD-10-CM

## 2013-07-17 DIAGNOSIS — E782 Mixed hyperlipidemia: Secondary | ICD-10-CM

## 2013-07-17 DIAGNOSIS — IMO0002 Reserved for concepts with insufficient information to code with codable children: Secondary | ICD-10-CM

## 2013-07-17 DIAGNOSIS — E11649 Type 2 diabetes mellitus with hypoglycemia without coma: Secondary | ICD-10-CM

## 2013-07-17 DIAGNOSIS — E1049 Type 1 diabetes mellitus with other diabetic neurological complication: Secondary | ICD-10-CM

## 2013-07-17 DIAGNOSIS — E1065 Type 1 diabetes mellitus with hyperglycemia: Secondary | ICD-10-CM

## 2013-07-17 DIAGNOSIS — E063 Autoimmune thyroiditis: Secondary | ICD-10-CM

## 2013-07-17 DIAGNOSIS — G909 Disorder of the autonomic nervous system, unspecified: Secondary | ICD-10-CM

## 2013-07-17 DIAGNOSIS — E1169 Type 2 diabetes mellitus with other specified complication: Secondary | ICD-10-CM

## 2013-07-17 DIAGNOSIS — E1042 Type 1 diabetes mellitus with diabetic polyneuropathy: Secondary | ICD-10-CM

## 2013-07-17 DIAGNOSIS — E049 Nontoxic goiter, unspecified: Secondary | ICD-10-CM

## 2013-07-17 DIAGNOSIS — I4711 Inappropriate sinus tachycardia, so stated: Secondary | ICD-10-CM

## 2013-07-17 DIAGNOSIS — R000 Tachycardia, unspecified: Secondary | ICD-10-CM

## 2013-07-17 LAB — POCT GLYCOSYLATED HEMOGLOBIN (HGB A1C): Hemoglobin A1C: 7

## 2013-07-17 NOTE — Progress Notes (Signed)
CC: FU T1DM, hypoglycemia, hypertension, hyperlipidemia, autonomic neuropathy, tachycardia, gastroparesis, peripheral neuropathy, thyroiditis, goiter, hepatitis B, fatigue  HPI: Samuel Valentine is a 42 y.o. Caucasian gentleman. He was unaccompanied.   1. Mr. Mohon was diagnosed with T2DM about 2002-2003. He was treated with metformin, Avandia, and Amaryl, but BGs were not well controlled, so Lantus was begun in 2005. When it became apparent that he would need a multiple daily injection regimen with Lantus and Novolog, he was referred to me by [name], RN, CDE, of the [facility]. I saw him for the first time on 02/12/05. He had just been started on Novolog aspart insulin at meals (120/25/10 plan). His PMH included recurrent hypoglycemia, hypertension, dyslipidemia, and proteinuria. He had had oral surgery in the past, but no other surgery. His psychiatric diagnoses included depression, anxiety, and dissociative identity disorder. He was also allergic to penicillin.  Social history included the fact that he was gay. All HIV tests had been negative through that time.  He had recently been laid off and had no health insurance. In March 2006, his HbA1c had been 10.6%. On exam he had a goiter and mild peripheral neuropathy. 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 We accepted him as a charity patient and enrolled him in our Diabetes Survival Skills Program. He was converted to a Medtronic 508 insulin pump in August 2006.   2. During the next eight  years his DM self-care efforts and HbA1c values waxed and waned, with A1c values ranging from 6.4%-9.2%. His worst BGs occurred when he was being treated with interferon for hepatitis B, from which he is now in remission. He has been gainfully employed at [employer], [employer], and again at [employer] for several years and has had health insurance. In 2011 we changed him to a Medtronic 723 (Revel) pump and  started him on the Medtronic CGM sensor. Since then his BGs have significantly improved.  3. His last [facility] visit was on 04/02/13. His HbA1c then was 7.2%, decreased from 8.0% at the prior visit. In the interim he has not been exercising much. He has been under a lot of stress at work and sometimes does not handle it well. He has been allowing his BGs to be lower and tries hard not to over-compensate. He goes to the gym two nights per week and does about 30 minutes of work on the elliptical machine, but stopped strength building exercises. He has also been walking occasionally. He has been changing sites every 3-4 days. He uses Apidra in his insulin pump. He wears the Medtronic  sensor most of the time. He reduced his enalapril to 10 mg once daily due to hypotension when exercising. He also takes 10 mg of atorvastatin once daily, Centrum Silver, and two 81 mg ASA per day. He continues to take [facility] as treatment for hepatitis. He has a FU appointment next week at [facility] to discuss his hepatitis. He may be able to taper and stop this medication in the next year. He has been eating more fruit recently, sometimes overdoing the mandarin oranges. .  4. Pertinent Review of Systems: Constitutional: The patient feels "pretty good". He is working too hard,  but still enjoys his job. He is healthy. He has few significant complaints. Eyes: Vision is good. His last eye exam was on June 25th 2014. There were no sign of diabetic eye disease at that visit. He has no significant eye complaints.  Neck:  His anterior neck has not been tender for several months..  Heart: He occasionally has faster heart rate when his BGs are high. Heart rate increases with exercise or other physical activity. He has occasional chest wall pains. The patient has no other complaints of irregular heat beats, chest pain, or chest pressure. Gastrointestinal: He has been having more reflux recently. He also has been having more  constipation when he doesn't drink enough. He does have occasional abdominal bloating, usually after eating a lot of fresh vegetables. Hands: His hand rash and itching have not recurred.  Legs: Muscle mass and strength seem normal. There are no complaints of numbness, tingling, burning, or pain. No edema is noted.  Feet:  He continues to apply an antifungal spray as a preventive measure about twice a week. There are no complaints of numbness, tingling, burning, or pain. No edema is noted. Hypoglycemia: Low BGs occur 2-3 times per week, usually associated with  over-correcting higher BGs, with not taking the time to attend to his BGs when they drop rapidly, or when he exercises. He has been using temporary basal rates, but really needs to start the lower rate about 30 minutes prior to exercise. Sometimes he subtracts points of BG after exercise, sometimes not. When he feels low he does not check his BG, but takes glucose immediately. His CGM keeps him out of trouble if he pays attention to the prompts. He is not intentionally keeping his BGs higher as often as he has in the past.    5 . BG printout: He changes sites every 2-4 days. He has had many more good sites than bad sites. His new sites are generally working better.  He is having some 40s, 50s, and 60s, mostly after exercise or correcting a BG > 250. His higher BGs occur when sites go bad on the 4th days, when he over-treats his low BGs, when he is stressed, or when he is sick. If his sites stay in for longer than 3 days the BGs tend to deteriorate rapidly. He checks his BGs 3-8 times per day. He also boluses 3-8 times daily. Some carb counts are better than others. He still sometimes gives a correction bolus based upon his CGM value rather than checking BGs. He also sometimes just gives manual boluses. His average fingerstick BG is 198, versus 188 at last visit.  6. CGM sensor readout: His CGM shows an average BG of about 170, compared with 170 at last  visit. He still has a great deal of variability, mostly related to eating out at restaurants, during and after exercise, or when he is not as engaged in managing his BGs. In general the sensor readings correlate with his CBGs very, very well. There was only one time in the past 4 weeks in which the sensor and BG tests were not congruent. On that event the sensor had been off for a short period of time.    PAST MEDICAL, FAMILY, AND SOCIAL HISTORY:  1. 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 He and his partner live in [place]. They were married in the [address] of [place].  2. He is not walking as often.  3. Primary care provider: Dr. Sharlot Gowda  REVIEW OF SYSTEMS: Mr. Dupuis does not have any significant problems involving his other body systems.  PHYSICAL EXAM: BP 125/80  Pulse 81  Wt 221 lb 8 oz (100.472 kg)  BMI 29.23 kg/m2  He has re-gained 4 pounds.  Constitutional: The patient looks healthy and good overall. He appears physically and emotionally well. His affect and engagement are very normal.  Eyes: There is no arcus or proptosis.  Mouth: The oropharynx appears normal. The tongue appears normal. There is normal oral moisture. There is no obvious gingivitis. Neck: There are no bruits present. The thyroid gland appears slightly enlarged. The thyroid gland is smaller at 20+ grams in size. The left  lobe is still mildly enlarged, somewhat firmer, and still mildly tender today.  The right lobe is about at the upper limit of normal size. Lungs: The lungs are clear. Air movement is good. Heart: The heart rhythm and rate appear normal. Heart sounds S1 and S2 are normal. I do not appreciate any pathologic heart murmurs. Abdomen: The abdomen is enlarged. Bowel sounds are normal. The abdomen is soft and non-tender. There is no obviously palpable hepatomegaly, splenomegaly, or other masses.  Arms: Muscle mass appears appropriate for age.  Hands: There is no obvious tremor. Phalangeal  and metacarpophalangeal joints appear normal. His rash has resolved.  Legs: Muscle mass appears appropriate for age. There is no edema.  Feet: There are no significant deformities. Dorsalis pedis pulses are 1+ on the right and 2+ on the left. His feet look good. He has a new plantar wart of the left mid-foot. He has no obvious tinea pedis of the feet today.  Neurologic: Muscle strength is normal for age and gender in both the upper and the lower extremities. Muscle tone appears normal. Sensation to touch is normal in the legs, but slightly decreased in the right heel.  LAB: HbA1c today is 7.0% today, compared with 7.2% at last visit and with 8.0% at prior visit.   Labs 12/06/12: CMP shows normal AST and ALT. Cholesterol 136, triglycerides 74, HDL 47 (decreased from 58 when he was exercising), LDL 74; microalbumin/creatinine ratio 2.3; TSH 1.782, free T4 0.98, free T3 3.1 Labs 02/03/12: CMP: Normal; TSH 1.565, free T4 0.98, free T3 3.0; cholesterol 156, triglycerides 125, HDL 58, LDL 73; Urinary microalbumin/creatinine ratio 4.2.  ASSESSMENT:  1. T1DM: His BG control is better again. He has purposely been allowing his BGs to be lower, without having too much hypoglycemia. It is clear that when his sites stay in for more than three days, BGs tend to rise rapidly. He is doing a much better job of basing his correction doses on BG values rather than sensor values. Late night snacking often produces higher BGs during the night.  .  2. Hypoglycemia: He is having fewer low BGs. Some low BGs occur when he over-corrected higher BGs. Some lows occur if he is delayed in eating. Some low BGs occur several hours after exercise, especially if he forgot to subtract 50-100-150 points of  BG after exercise.   3. Thyroiditis: Clinically active today, c/w ongoing thyroiditis.. 4. Goiter: The thyroid gland has changed somewhat in size since last visit. The waxing and waning of thyroid gland size is c/w evolving Hashimoto's  disease.  He was euthyroid in March.  5. Hypertension: BP is somewhat higher due to the combination of exercising less and gaining weight. If he does not plan to resume exercise in the near future, he should take 15 mg of lisinopril per day.  6. Hyperlipidemia: Lipid panel results in March were good overall, although his HDL was lower after stopping exercise. His resumption of exercise should help. 7. Autonomic neuropathy, tachycardia, and gastroparesis: With the improvement in BG control he has had, his  neuropathy, tachycardia, and gastroparesis have all improved.  8. Peripheral neuropathy: He is asymptomatic today. He has only minimal evidence of neuropathy on physical exam. The neuropathy is probably a combination of glucose effects and of prior damage to the sciatic nerve.  9. Adjustment reaction: He is doing very well today.   PLAN:  1. Diagnostic: HbA1c today.  Annual surveillance labs prior to next visit.  2. Therapeutic: Try to change pump site every three days. Use Lamisil or other anti-fungal agent for tinea pedis. Continue enalapril and atorvastatin. Eat right. Exercise right. Don't try too hard to avoid hypoglycemia. Make small adjustments in basal rates as needed. Be prepared to reduce basal rates and/or to use temporary basal rates when exercising. Please subtract 50-100-150 points of BG after exercise. 3. Patient education: We discussed life style changes at length. He has been much better at taking care of his DM in the past 6 months.  4. Follow up: FU appointment in 3 months.  Level of Service: This visit lasted in excess of 55 minutes. More than 50% of the visit was devoted to counseling.  David Stall

## 2013-07-17 NOTE — Patient Instructions (Signed)
Follow up visit in 3 months. Keep up the good work. Try to fit in more exercise.

## 2013-07-26 ENCOUNTER — Other Ambulatory Visit: Payer: Self-pay

## 2013-09-05 ENCOUNTER — Other Ambulatory Visit: Payer: Self-pay | Admitting: *Deleted

## 2013-09-05 DIAGNOSIS — E1065 Type 1 diabetes mellitus with hyperglycemia: Secondary | ICD-10-CM

## 2013-09-06 ENCOUNTER — Ambulatory Visit (INDEPENDENT_AMBULATORY_CARE_PROVIDER_SITE_OTHER): Payer: BC Managed Care – PPO | Admitting: Family Medicine

## 2013-09-06 ENCOUNTER — Encounter: Payer: Self-pay | Admitting: Family Medicine

## 2013-09-06 VITALS — BP 124/90 | HR 78 | Wt 215.0 lb

## 2013-09-06 DIAGNOSIS — A630 Anogenital (venereal) warts: Secondary | ICD-10-CM

## 2013-09-06 DIAGNOSIS — B070 Plantar wart: Secondary | ICD-10-CM

## 2013-09-06 NOTE — Progress Notes (Signed)
   Subjective:    Patient ID: Samuel Valentine, male    DOB: [birth year], 42 y.o.   MRN: [id number]  HPI He is here for evaluation of warts. He does have some in the anal area. He has had difficulty with this in the past. He also has a wart present on the plantar surface of his left foot. He does have underlying insulin-dependent diabetes and is on the pump. He is handling this quite well.   Review of Systems     Objective:   Physical Exam Anal exam shows 2 very small warts. He also has a small wart on the plantar surface of his left foot.       Assessment & Plan:  Plantar wart of left foot  Anal warts  the anal warts were injected with Xylocaine and hyfrecated without difficulty. The plantar wart was shaved carefully. I instructed him on proper use of Compound W and to scrape off or shave off the white dead tissue. He is to return here in one month for recheck on this. He is aware that he needs to be very careful concerning foot care since he is a diabetic.

## 2013-09-25 ENCOUNTER — Other Ambulatory Visit: Payer: Self-pay | Admitting: "Endocrinology

## 2013-10-17 ENCOUNTER — Ambulatory Visit: Payer: BC Managed Care – PPO | Admitting: "Endocrinology

## 2013-11-07 ENCOUNTER — Other Ambulatory Visit: Payer: Self-pay | Admitting: "Endocrinology

## 2013-12-10 ENCOUNTER — Encounter: Payer: Self-pay | Admitting: Family Medicine

## 2013-12-10 ENCOUNTER — Ambulatory Visit (INDEPENDENT_AMBULATORY_CARE_PROVIDER_SITE_OTHER): Payer: BC Managed Care – PPO | Admitting: Family Medicine

## 2013-12-10 ENCOUNTER — Other Ambulatory Visit: Payer: Self-pay | Admitting: "Endocrinology

## 2013-12-10 VITALS — BP 100/60 | HR 68 | Temp 98.3°F | Ht 73.0 in | Wt 215.0 lb

## 2013-12-10 DIAGNOSIS — E109 Type 1 diabetes mellitus without complications: Secondary | ICD-10-CM

## 2013-12-10 DIAGNOSIS — R05 Cough: Secondary | ICD-10-CM

## 2013-12-10 DIAGNOSIS — R059 Cough, unspecified: Secondary | ICD-10-CM

## 2013-12-10 DIAGNOSIS — J019 Acute sinusitis, unspecified: Secondary | ICD-10-CM

## 2013-12-10 MED ORDER — BENZONATATE 200 MG PO CAPS: 200.0000 mg | ORAL_CAPSULE | Freq: Three times a day (TID) | ORAL | Status: DC | PRN

## 2013-12-10 MED ORDER — AZITHROMYCIN 250 MG PO TABS: ORAL_TABLET | ORAL | Status: DC

## 2013-12-10 NOTE — Progress Notes (Signed)
Chief Complaint  Patient presents with  . Cough    started about 4 days ago with a cough. Vomited yesterday, felt nauseous all day. Had chills and sweats all day, sweat through several shirts. Has lost 10 lbs over the last month intentionally, wonders if he may have brought this on himself.    4 days ago he felt warm, clammy, slight cough.  It progressed over the weekend to significant fatigue, nasal congestion and cough.  Nasal mucus was discolored (yellow-green, brown), and cough was productive of similar-looking phlegm.  He had significant chills, felt feverish (lost thermometer with recent move).  Still had sweats even last night.  Yesterday he had nausea, vomited once.  Stools have not been loose, but smell worse than normal.  Feels much better now than over the weekend, but presents for evaluation due to the fact that he has been having increased ketones in his urine.  He has been having elevated blood sugars, up to 200-300's, which are abnormal for him.  He has insulin pump and has been giving boluses, and overall keeping numbers down.  He has used Nyquil (helps him sleep), and Delsym yesterday (didn't help as much as in the past).   +sick coworkers  Past Medical History  Diagnosis Date  . Type 1 diabetes     Dr. Fransico Michael  . Chronic hepatitis B     followed at [facility]  . Hypercholesterolemia    Outpatient Encounter Prescriptions as of 12/10/2013  Medication Sig  . aspirin 81 MG chewable tablet Chew 81 mg by mouth daily.    Marland Kitchen atorvastatin (LIPITOR) 10 MG tablet TAKE 1 TABLET BY MOUTH EVERY DAY  . BAYER CONTOUR NEXT TEST test strip USE TO TEST 6 TO 8 TIMES DAILY AS DIRECTED  . enalapril (VASOTEC) 5 MG tablet TAKE 2 TABLETS BY MOUTH TWICE DAILY  . entecavir (BARACLUDE) 0.5 MG tablet Take 0.5 mg by mouth daily.    . insulin aspart (NOVOLOG PENFILL) 100 UNIT/ML SOCT cartridge Use Novolog aspart insulin from vials in your insulin pump, 300 units every 2-3 days.  . Multiple Vitamin  (MULTIVITAMIN) tablet Take 1 tablet by mouth daily.    Marland Kitchen azithromycin (ZITHROMAX) 250 MG tablet Take 2 tablets by mouth on first day, then 1 tablet by mouth on days 2 through 5  . benzonatate (TESSALON) 200 MG capsule Take 1 capsule (200 mg total) by mouth 3 (three) times daily as needed.  . valACYclovir (VALTREX) 1000 MG tablet 2 pills twice a day at the first sign of cold sore   Allergies  Allergen Reactions  . Penicillins    ROS:  Denies chest pain, palpitations, bleeding, bruising, rashes.  +cough, URI symptoms, vomiting and chills, per HPI.  Rash on left abdomen related to adhesive from sensor, resolving since moved.  PHYSICAL EXAM: BP 100/60  Pulse 68  Temp(Src) 98.3 F (36.8 C) (Oral)  Ht 6\' 1"  (1.854 m)  Wt 215 lb (97.523 kg)  BMI 28.37 kg/m2 Well developed, pleasant male, with dry, hacky cough throughout visit HEENT:  PERRL, EOMI, conjunctiva clear.  TM's and EAC's normal.  Nasal mucosa is mildly edematous, with yellow drainage.  Mild discomfort over frontal sinuses.  Significant septal deviation to the right.  OP clear, mucus membranes are moist Lungs: clear, no wheezes or cough with forced expiration Skin: Some reaction to adhesive from sensor on left abdomen, no evidence of infection  ASSESSMENT/PLAN:  Acute sinusitis - Plan: azithromycin (ZITHROMAX) 250 MG tablet  Cough - Plan:  benzonatate (TESSALON) 200 MG capsule  DIABETES MELLITUS, TYPE I - erratic sugars related to acute illness.  continue boluses as per endocrinologist   Drink plenty of fluids. Use aleve as needed for headache, fever. Use sinus rinses once or twice daily. Use Mucinex (plain or D) to help with the sinus pressure, and thin out the mucus. Use Tessalon if needed for cough. You may continue to use Delsym (vs Mucinex DM, not both) if needed for cough  Take the z-pak as directed.  It should work for 10 days, even though you only take it for 5.  If you are worse rather than improving after 5 days,  call to change prescription.  If improving, then be patient.  Call on day 10 if you aren't completely better, for a refill (sometimes a longer course is needed).

## 2013-12-10 NOTE — Patient Instructions (Signed)
  Drink plenty of fluids. Use aleve as needed for headache, fever. Use sinus rinses once or twice daily. Use Mucinex (plain or D) to help with the sinus pressure, and thin out the mucus. Use Tessalon if needed for cough. You may continue to use Delsym (vs Mucinex DM, not both) if needed for cough  Take the z-pak as directed.  It should work for 10 days, even though you only take it for 5.  If you are worse rather than improving after 5 days, call to change prescription.  If improving, then be patient.  Call on day 10 if you aren't completely better, for a refill (sometimes a longer course is needed).  Return if you have worsening symptoms, ongoing fevers, shortness of breath, sugars that remain incredibly high (with ketones in urine).

## 2013-12-11 ENCOUNTER — Other Ambulatory Visit: Payer: Self-pay | Admitting: *Deleted

## 2013-12-11 MED ORDER — ENALAPRIL MALEATE 5 MG PO TABS: ORAL_TABLET | ORAL | Status: DC

## 2013-12-12 ENCOUNTER — Telehealth: Payer: Self-pay | Admitting: *Deleted

## 2013-12-12 NOTE — Telephone Encounter (Signed)
[employer] [facility] has notified me that Samuel Valentine has received his MiniMed 530G Insulin Pump with Enlite Continuous Monitor/Sensor System.   I called Samuel Valentine to schedule him for 530G Pump Upgrade and [employer] Sensor Training and Start.  Samuel Valentine stated he is ill with a sinus infection and saw his PCP last Friday. He is following his Sick Day Protocol to control his blood sugar. He is currently on the Revel 723 Pump with CGM.  He will check his schedule later today or tomorrow and call me back to schedule.  I requested that he complete the following assignments prior to meeting with me prior to starting on his 530G Pump and for [facility] Sensor Training & Start:  1. Complete the [employer] Sensor and [employer] Personal modules online at [employer]. 2. Read through the booklet Getting Started with Continuous Glucose Monitoring. 3. Transfer his current pump settings from his 723 Pump to his 530G Pump. I will check them at the visit.

## 2013-12-13 ENCOUNTER — Ambulatory Visit: Payer: BC Managed Care – PPO | Admitting: "Endocrinology

## 2013-12-14 ENCOUNTER — Ambulatory Visit: Payer: BC Managed Care – PPO | Admitting: "Endocrinology

## 2013-12-17 ENCOUNTER — Telehealth: Payer: Self-pay | Admitting: *Deleted

## 2013-12-17 NOTE — Telephone Encounter (Signed)
I left [name] a voice mail on his cell/home number: 1. I have had a cancellation for tomorrow morning 12/18/13 0930-1130 or 10-12.  2. Would he like to come in so I can start him on his 530G pump and Enlite Sensor? 3. I requested a call back.

## 2013-12-18 ENCOUNTER — Telehealth: Payer: Self-pay | Admitting: Family Medicine

## 2013-12-18 DIAGNOSIS — J019 Acute sinusitis, unspecified: Secondary | ICD-10-CM

## 2013-12-18 MED ORDER — AZITHROMYCIN 250 MG PO TABS: ORAL_TABLET | ORAL | Status: DC

## 2013-12-18 NOTE — Telephone Encounter (Signed)
Since it sounds like he has improved some, I sent a refill of z-pak to his pharmacy.  Let him know that he isn't to start this until day 11 (Friday).  If he has ongoing cough and/or shortness of breath, he will need a follow-up appointment for re-eval. We did discuss that if he was getting better, but not 100% by day 10, to take another course of z-pak.  Please let him know refill was sent, not to start until Friday, and f/u if persistent/worsening symptoms.

## 2013-12-18 NOTE — Telephone Encounter (Signed)
Pt informed Dr. Delford FieldKnapp's message & advised him that is he isn't better soon or having any SOB to call for appt

## 2013-12-20 ENCOUNTER — Telehealth: Payer: Self-pay | Admitting: Family Medicine

## 2013-12-20 ENCOUNTER — Encounter: Payer: Self-pay | Admitting: Family Medicine

## 2013-12-20 ENCOUNTER — Ambulatory Visit (INDEPENDENT_AMBULATORY_CARE_PROVIDER_SITE_OTHER): Payer: BC Managed Care – PPO | Admitting: Family Medicine

## 2013-12-20 VITALS — BP 112/76 | HR 72 | Temp 98.2°F | Ht 73.0 in

## 2013-12-20 DIAGNOSIS — Z202 Contact with and (suspected) exposure to infections with a predominantly sexual mode of transmission: Secondary | ICD-10-CM

## 2013-12-20 DIAGNOSIS — R350 Frequency of micturition: Secondary | ICD-10-CM

## 2013-12-20 DIAGNOSIS — Z79899 Other long term (current) drug therapy: Secondary | ICD-10-CM

## 2013-12-20 DIAGNOSIS — N390 Urinary tract infection, site not specified: Secondary | ICD-10-CM

## 2013-12-20 DIAGNOSIS — E109 Type 1 diabetes mellitus without complications: Secondary | ICD-10-CM

## 2013-12-20 DIAGNOSIS — J019 Acute sinusitis, unspecified: Secondary | ICD-10-CM

## 2013-12-20 DIAGNOSIS — R61 Generalized hyperhidrosis: Secondary | ICD-10-CM

## 2013-12-20 LAB — POCT URINALYSIS DIPSTICK
BILIRUBIN UA: NEGATIVE
GLUCOSE UA: NEGATIVE
KETONES UA: NEGATIVE
Nitrite, UA: POSITIVE
PH UA: 5
SPEC GRAV UA: 1.015
Urobilinogen, UA: NEGATIVE

## 2013-12-20 MED ORDER — LEVOFLOXACIN 500 MG PO TABS: 500.0000 mg | ORAL_TABLET | Freq: Every day | ORAL | Status: DC

## 2013-12-20 NOTE — Telephone Encounter (Signed)
These symptoms are very different from previous.  Sounds like urine needs to be checked.  I have no availability but am willing to work him in--he will need to wait a bit. Vs going to an UC.  Have him come around 3 and I will do my best

## 2013-12-20 NOTE — Progress Notes (Signed)
Chief Complaint  Patient presents with  . Urinary Frequency    feels like he is not emptying his bladder completely. Some pain at the end of urination. Does make mention of a milky white discharge. Still coughing. Excessive sweating at night-soaking through numerous shirts.    When sugars had been all over the place (high, with ketones) he had urinary frequency.  He has noted over the last 5-6 days that he has had some pain with urination, and the last 3 days the urine has looked cloudy, and there has been an odor.  He had discomfort at the end of the void here today when giving urine sample, with some milky white discharge (along with pain) noted at the very end of void.  Prior to this, hasn't noted any penile discharge/drainage.  +urgency/frequency.  No nausea, vomiting, flank pain or abdominal pain.  He continues to have night sweats, needing to change his shirt nightly once.  Denies fevers, chills. No further nausea.  Appetite has returned to normal.  Cough has improved (but still has some annoying dry cough).  Less nasal congestion.  In the morning he gets up some thick, white mucus.  He has been using plain Mucinex which has helped keep secretions thin.  Normal TB tests in the past.  No known TB exposures. No HIV test in 4-5 years, denies risks--stable, monogamous relationship with his husband, however later in visit he revealed that they did some experimenting, possible low-risk exposure when on a couples trip in November; he would like STD testing done. Asking for liver check due to his meds  Past Medical History  Diagnosis Date  . Type 1 diabetes     Dr. Fransico Michael  . Chronic hepatitis B     followed at [facility]  . Hypercholesterolemia    History reviewed. No pertinent past surgical history. History   Social History  . Marital Status: Single    Spouse Name: N/A    Number of Children: N/A  . Years of Education: N/A   Occupational History  . data analyst [employer]   Social History Main  Topics  . Smoking status: Never Smoker   . Smokeless tobacco: Never Used  . Alcohol Use: Yes     Comment: maybe one drink per month  . Drug Use: No  . Sexual Activity: Yes    Partners: Male   Other Topics Concern  . Not on file   Social History Narrative   Same sex marriage, 2 cats   Current Outpatient Prescriptions on File Prior to Visit  Medication Sig Dispense Refill  . aspirin 81 MG chewable tablet Chew 81 mg by mouth daily.        Marland Kitchen atorvastatin (LIPITOR) 10 MG tablet TAKE 1 TABLET BY MOUTH EVERY DAY  30 tablet  3  . BAYER CONTOUR NEXT TEST test strip USE TO TEST 6 TO 8 TIMES DAILY AS DIRECTED  250 each  6  . enalapril (VASOTEC) 5 MG tablet TAKE 2 TABLETS BY MOUTH TWICE DAILY  360 tablet  0  . entecavir (BARACLUDE) 0.5 MG tablet Take 0.5 mg by mouth daily.        . insulin aspart (NOVOLOG PENFILL) 100 UNIT/ML SOCT cartridge Use Novolog aspart insulin from vials in your insulin pump, 300 units every 2-3 days.  120 mL  3  . Multiple Vitamin (MULTIVITAMIN) tablet Take 1 tablet by mouth daily.        . benzonatate (TESSALON) 200 MG capsule Take 1 capsule (200  mg total) by mouth 3 (three) times daily as needed.  30 capsule  0  . valACYclovir (VALTREX) 1000 MG tablet 2 pills twice a day at the first sign of cold sore  12 tablet  5   No current facility-administered medications on file prior to visit.   Allergies  Allergen Reactions  . Penicillins    ROS:  See HPI.  No headaches, dizziness, chest pain, nausea, vomiting, diarrhea, bleeding, bruising, rashes.  +urinary complaints, cough, night sweats.    PHYSICAL EXAM: BP 112/76  Pulse 72  Temp(Src) 98.2 F (36.8 C) (Oral)  Ht 6\' 1"  (1.854 m)  PF 210 L/min Well developed, pleasant male with intermittent dry cough, in no distress, joking HEENT:  PERRL, EOMI, conjunctiva clear.  OP clear Neck: no lymphadenopathy or mass Heart: regular rate and rhythm without murmur Lungs: clear bilaterally, no wheezes, rales, ronchi.  Good air  movement Back: no CVA tenderness Abdomen: soft, nontender Skin: no rash/lesions  Urine dip:  3+ leuks, nitrite +, 1+ blood, 1+ protein  ASSESSMENT/PLAN:  Urinary tract infection, site not specified - Plan: Urine culture, levofloxacin (LEVAQUIN) 500 MG tablet  Urinary frequency - Plan: POCT Urinalysis Dipstick  Night sweats - check CBC.  ?if related to recent illness, fevers.  consider CXR and PPD.  lungs were clear; can't place PPD today (Thurs) - Plan: CBC with Differential  Possible exposure to STD - Plan: HIV antibody, GC/Chlamydia Probe Amp, RPR  Encounter for long-term (current) use of other medications - Plan: Comprehensive metabolic panel  DIABETES MELLITUS, TYPE I  Acute sinusitis - improving.  residual mild dry cough (improved).  Since starting Levaquin for urinary infection, doesn't need to refill z-pak as prev recommended   Do not fill the z-pak that is waiting at the pharmacy. Instead, start the levaquin today  Consider CXR and/or PPD (can't do today bc is Thursday) if night sweats persist, despite treatment of this infection.

## 2013-12-20 NOTE — Patient Instructions (Signed)
  Do not fill the z-pak that is waiting at the pharmacy. Instead, start the levaquin today

## 2013-12-20 NOTE — Telephone Encounter (Signed)
Called pt & informed of Dr Delford FieldKnapp's instructions

## 2013-12-21 ENCOUNTER — Encounter: Payer: Self-pay | Admitting: Family Medicine

## 2013-12-21 LAB — COMPREHENSIVE METABOLIC PANEL
ALBUMIN: 4.1 g/dL (ref 3.5–5.2)
ALT: 13 U/L (ref 0–53)
AST: 13 U/L (ref 0–37)
Alkaline Phosphatase: 101 U/L (ref 39–117)
BUN: 10 mg/dL (ref 6–23)
CALCIUM: 9.7 mg/dL (ref 8.4–10.5)
CHLORIDE: 94 meq/L — AB (ref 96–112)
CO2: 30 mEq/L (ref 19–32)
Creat: 0.88 mg/dL (ref 0.50–1.35)
Glucose, Bld: 228 mg/dL — ABNORMAL HIGH (ref 70–99)
POTASSIUM: 4.8 meq/L (ref 3.5–5.3)
Sodium: 132 mEq/L — ABNORMAL LOW (ref 135–145)
Total Bilirubin: 0.4 mg/dL (ref 0.2–1.2)
Total Protein: 7.2 g/dL (ref 6.0–8.3)

## 2013-12-21 LAB — HIV ANTIBODY (ROUTINE TESTING W REFLEX): HIV: NONREACTIVE

## 2013-12-21 LAB — RPR

## 2013-12-21 LAB — GC/CHLAMYDIA PROBE AMP
CT Probe RNA: NEGATIVE
GC PROBE AMP APTIMA: NEGATIVE

## 2013-12-22 ENCOUNTER — Encounter: Payer: Self-pay | Admitting: Family Medicine

## 2013-12-23 LAB — URINE CULTURE: Colony Count: >=100000

## 2013-12-24 NOTE — Progress Notes (Signed)
Sent to Dr. Lynelle DoctorKnapp

## 2013-12-25 ENCOUNTER — Other Ambulatory Visit: Payer: BC Managed Care – PPO

## 2013-12-25 DIAGNOSIS — Z79899 Other long term (current) drug therapy: Secondary | ICD-10-CM

## 2013-12-25 LAB — CBC WITH DIFFERENTIAL/PLATELET
BASOS ABS: 0 10*3/uL (ref 0.0–0.1)
Eosinophils Absolute: 0 10*3/uL (ref 0.0–0.7)
HEMATOCRIT: 40.6 % (ref 39.0–52.0)
HEMOGLOBIN: 13.4 g/dL (ref 13.0–17.0)
Lymphocytes Relative: 30 % (ref 12–46)
Lymphs Abs: 2.1 10*3/uL (ref 0.7–4.0)
MCH: 30.4 pg (ref 26.0–34.0)
MCHC: 33 g/dL (ref 30.0–36.0)
MCV: 91.9 fL (ref 78.0–100.0)
MONO ABS: 0.4 10*3/uL (ref 0.1–1.0)
MONOS PCT: 6 % (ref 3–12)
NEUTROS ABS: 4.5 10*3/uL (ref 1.7–7.7)
Neutrophils Relative %: 64 % (ref 43–77)
Platelets: 288 10*3/uL (ref 150–400)
RBC: 4.41 MIL/uL (ref 4.22–5.81)
RDW: 12.3 % (ref 11.5–15.5)
WBC: 7 10*3/uL (ref 4.0–10.5)

## 2013-12-25 NOTE — Addendum Note (Signed)
Addended by: Barbette OrLOWE, SABRINA A on: 12/25/2013 08:30 AM   Modules accepted: Orders

## 2014-01-02 ENCOUNTER — Telehealth: Payer: Self-pay | Admitting: *Deleted

## 2014-01-02 NOTE — Telephone Encounter (Signed)
I called Samuel Valentine to schedule an [facility] 530G pump [facility]. I left him a message to please return my call.

## 2014-01-15 ENCOUNTER — Other Ambulatory Visit: Payer: Self-pay | Admitting: *Deleted

## 2014-01-15 DIAGNOSIS — IMO0002 Reserved for concepts with insufficient information to code with codable children: Secondary | ICD-10-CM

## 2014-01-15 DIAGNOSIS — E1065 Type 1 diabetes mellitus with hyperglycemia: Secondary | ICD-10-CM

## 2014-01-21 ENCOUNTER — Encounter: Payer: Self-pay | Admitting: Family Medicine

## 2014-01-21 ENCOUNTER — Ambulatory Visit (INDEPENDENT_AMBULATORY_CARE_PROVIDER_SITE_OTHER): Payer: BC Managed Care – PPO | Admitting: Family Medicine

## 2014-01-21 VITALS — BP 114/70 | HR 68 | Wt 207.0 lb

## 2014-01-21 DIAGNOSIS — E109 Type 1 diabetes mellitus without complications: Secondary | ICD-10-CM

## 2014-01-21 DIAGNOSIS — J019 Acute sinusitis, unspecified: Secondary | ICD-10-CM

## 2014-01-21 MED ORDER — DOXYCYCLINE HYCLATE 100 MG PO TABS: 100.0000 mg | ORAL_TABLET | Freq: Two times a day (BID) | ORAL | Status: DC

## 2014-01-21 NOTE — Progress Notes (Signed)
   Subjective:    Patient ID: Samuel Valentine, male    DOB: 07-Dec-1970, 43 y.o.   MRN: [id number]  HPI  [name] is a very pleasant 43 y.o. yo male who  has a past medical history of Type 1 diabetes; Chronic hepatitis B; and Hypercholesterolemia. He presents today for acute sinus infection.  The patient's symptoms began 10 days ago with nasal congestion and a very sore throat. Since that time the patient has developed a persistent moderately productive cough. The cough is the patient's main complaint at this time as it is keeping him up at night and occasionally feles as if "it just won't ever stop."  The patient developed yellow nasal discharge, cough productive of bright yellow sputum, bilateral episodic ear pain and bilateral tooth pain.   . Started mostly with the cognestion and sore troat. The coughing has also progressed and is now keeping the patient up at night. Sometimes feels like he just can't stop. Started having bright yellow nasal discharge 7 days ago. The dischargs is espeically dark in the mornings.  hroat itching and burning very   The patient denies any fever and chills. Patient endorses some tooth pain and ear pain which began 7 days ago. The pain comes and goes, is cyclical. Worst at night and in the early morning.   Had sinusitis in the past that resolved with levoquin. The patient has an allergy to doxycyline.  Flonase, nyquil and other OTC medications are moderately helpful, especially Flonase.  He does have underlying type 1 diabetes and has noted a slight fluctuation in his blood sugars. He has a very good handle on how to handle this.  Review of Systems is negative except per HPI.    Objective:   Physical Exam  Constitutional: Patient is well-developed, well-nourished, and in no distress. HENT: Head is normocephalic and atraumatic.  Mouth/Throat: Oropharynx is clear and moist with moderate erythema and no exudates Ears: TMs within normal  limits Cardiovascular: Normal rate, regular rhythm. Exam reveals no murmurs, gallops and no friction rub.  Pulmonary/Chest: Effort normal and CTAB. No respiratory distress.  Sinuses: tender to palpation of both the frontal and maxillary sinuses.  Psychiatric: Affect normal.     Assessment & Plan:  Acute sinusitis - Plan: doxycycline (VIBRA-TABS) 100 MG tablet  DIABETES MELLITUS, TYPE I  he will call if not entirely back to normal when he finishes the antibiotic.

## 2014-02-06 ENCOUNTER — Ambulatory Visit: Payer: BC Managed Care – PPO | Admitting: "Endocrinology

## 2014-02-26 ENCOUNTER — Telehealth: Payer: Self-pay | Admitting: *Deleted

## 2014-02-26 NOTE — Telephone Encounter (Signed)
Spoke to [place] to reschedule his appt with Dr. Fransico Michael. I also asked him about Scherry Ran Brennan's note ref his enlite and new pump. Trey Paula advised that he received both about 6 months ago, programmed them and has been using them both with no problems. 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 I advised that if he needed [place] to call and that as long as he was doing well and everything was working well, then that's great. Appt rescheduled for Sept. KW

## 2014-03-03 ENCOUNTER — Other Ambulatory Visit: Payer: Self-pay | Admitting: "Endocrinology

## 2014-05-22 ENCOUNTER — Encounter: Payer: Self-pay | Admitting: Family Medicine

## 2014-05-22 ENCOUNTER — Ambulatory Visit (INDEPENDENT_AMBULATORY_CARE_PROVIDER_SITE_OTHER): Payer: BC Managed Care – PPO | Admitting: Family Medicine

## 2014-05-22 VITALS — BP 110/70 | HR 64 | Wt 204.0 lb

## 2014-05-22 DIAGNOSIS — A630 Anogenital (venereal) warts: Secondary | ICD-10-CM

## 2014-05-22 DIAGNOSIS — Z23 Encounter for immunization: Secondary | ICD-10-CM

## 2014-05-22 MED ORDER — LIDOCAINE HCL 2 % IJ SOLN
2.0000 mL | Freq: Once | INTRAMUSCULAR | Status: AC
  Administered 2014-05-22: 40 mg

## 2014-05-22 NOTE — Progress Notes (Signed)
   Subjective:    Patient ID: Samuel Valentine, male    DOB: 1971-05-17, 43 y.o.   MRN: [id number]  HPI He has a wart present at the base of the penis and also several in the anal area. He would like them removed.   Review of Systems     Objective:   Physical Exam 1 cm wart noted on the dorsal surface of the penis at the base. He also has 3 warts present in the anal area.       Assessment & Plan:  Need for prophylactic vaccination and inoculation against influenza - Plan: Flu Vaccine QUAD 36+ mos IM  Warts, genital  Anal warts  all 4 of the lesions were injected with Xylocaine and hyfrecated without difficulty. She tolerated the procedure well. Flu shot also given.

## 2014-06-19 ENCOUNTER — Ambulatory Visit (INDEPENDENT_AMBULATORY_CARE_PROVIDER_SITE_OTHER): Payer: BC Managed Care – PPO | Admitting: "Endocrinology

## 2014-06-19 ENCOUNTER — Encounter: Payer: Self-pay | Admitting: "Endocrinology

## 2014-06-19 VITALS — BP 119/81 | HR 89 | Wt 201.0 lb

## 2014-06-19 DIAGNOSIS — E063 Autoimmune thyroiditis: Secondary | ICD-10-CM | POA: Diagnosis not present

## 2014-06-19 DIAGNOSIS — E1049 Type 1 diabetes mellitus with other diabetic neurological complication: Secondary | ICD-10-CM

## 2014-06-19 DIAGNOSIS — E049 Nontoxic goiter, unspecified: Secondary | ICD-10-CM | POA: Diagnosis not present

## 2014-06-19 DIAGNOSIS — I498 Other specified cardiac arrhythmias: Secondary | ICD-10-CM

## 2014-06-19 DIAGNOSIS — E1149 Type 2 diabetes mellitus with other diabetic neurological complication: Secondary | ICD-10-CM

## 2014-06-19 DIAGNOSIS — E1069 Type 1 diabetes mellitus with other specified complication: Secondary | ICD-10-CM | POA: Diagnosis not present

## 2014-06-19 DIAGNOSIS — E782 Mixed hyperlipidemia: Secondary | ICD-10-CM

## 2014-06-19 DIAGNOSIS — R000 Tachycardia, unspecified: Secondary | ICD-10-CM

## 2014-06-19 DIAGNOSIS — E10649 Type 1 diabetes mellitus with hypoglycemia without coma: Secondary | ICD-10-CM

## 2014-06-19 DIAGNOSIS — F4389 Other reactions to severe stress: Secondary | ICD-10-CM

## 2014-06-19 DIAGNOSIS — E1065 Type 1 diabetes mellitus with hyperglycemia: Secondary | ICD-10-CM | POA: Diagnosis not present

## 2014-06-19 DIAGNOSIS — G909 Disorder of the autonomic nervous system, unspecified: Secondary | ICD-10-CM

## 2014-06-19 DIAGNOSIS — E1143 Type 2 diabetes mellitus with diabetic autonomic (poly)neuropathy: Secondary | ICD-10-CM

## 2014-06-19 DIAGNOSIS — F438 Other reactions to severe stress: Secondary | ICD-10-CM

## 2014-06-19 DIAGNOSIS — IMO0001 Reserved for inherently not codable concepts without codable children: Secondary | ICD-10-CM

## 2014-06-19 DIAGNOSIS — F4320 Adjustment disorder, unspecified: Secondary | ICD-10-CM

## 2014-06-19 DIAGNOSIS — IMO0002 Reserved for concepts with insufficient information to code with codable children: Secondary | ICD-10-CM | POA: Diagnosis not present

## 2014-06-19 DIAGNOSIS — E162 Hypoglycemia, unspecified: Secondary | ICD-10-CM

## 2014-06-19 DIAGNOSIS — K3184 Gastroparesis: Secondary | ICD-10-CM

## 2014-06-19 DIAGNOSIS — E1142 Type 2 diabetes mellitus with diabetic polyneuropathy: Secondary | ICD-10-CM

## 2014-06-19 DIAGNOSIS — E1043 Type 1 diabetes mellitus with diabetic autonomic (poly)neuropathy: Secondary | ICD-10-CM

## 2014-06-19 DIAGNOSIS — I10 Essential (primary) hypertension: Secondary | ICD-10-CM

## 2014-06-19 LAB — POCT GLYCOSYLATED HEMOGLOBIN (HGB A1C): Hemoglobin A1C: 7.7

## 2014-06-19 LAB — GLUCOSE, POCT (MANUAL RESULT ENTRY): POC GLUCOSE: 205 mg/dL — AB (ref 70–99)

## 2014-06-19 MED ORDER — GLUCAGON (RDNA) 1 MG IJ KIT: 1.0000 mg | PACK | Freq: Once | INTRAMUSCULAR | Status: DC | PRN

## 2014-06-19 NOTE — Patient Instructions (Signed)
Follow up visit in three months.  

## 2014-06-19 NOTE — Progress Notes (Signed)
CC: FU T1DM, hypoglycemia, hypertension, hyperlipidemia, autonomic neuropathy, tachycardia, gastroparesis, peripheral neuropathy, thyroiditis, goiter, hepatitis B, fatigue  HPI: Samuel Valentine is a 43 y.o. Caucasian gentleman. He was unaccompanied.   1. Samuel Valentine was diagnosed with T2DM about 2002-2003. He was treated with metformin, Avandia, and Amaryl, but BGs were not well controlled, so Lantus was begun in 2005. When it became apparent that he would need a multiple daily injection regimen with Lantus and Novolog, he was referred to me by [name], RN, CDE, of the [facility]. I saw him for the first time on 02/12/05. He had just been started on Novolog aspart insulin at meals (120/25/10 plan). His PMH included recurrent hypoglycemia, hypertension, dyslipidemia, and proteinuria. He had had oral surgery in the past, but no other surgery. His psychiatric diagnoses included depression, anxiety, and dissociative identity disorder. He was also allergic to penicillin.  Social history included the fact that he was gay. All HIV tests had been negative through that time.  He had recently been laid off and had no health insurance. In March 2006, his HbA1c had been 10.6%. On exam he had a goiter and mild peripheral neuropathy. 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 We accepted him as a charity patient and enrolled him in our Diabetes Survival Skills Program. He was converted to a Medtronic 508 insulin pump in August 2006.   2. During the next eight  years his DM self-care efforts and HbA1c values waxed and waned, with A1c values ranging from 6.4%-9.2%. His worst BGs occurred when he was being treated with interferon for hepatitis B, from which he is now in remission. He has been gainfully employed at [employer], [employer], and again at [employer] for several years and has had health insurance. In 2011 we changed him to a Medtronic 723 (Revel) pump and  started him on the Medtronic CGM sensor. He is now on a Medtronic 530G insulin pump. Since the introduction of CGM-pump therapy, his BGs have significantly improved.  3. His last [facility] visit was on 07/17/13. His HbA1c then was 7.0%, decreased from 7.2% at the prior visit. In the interim he has been healthy. He has also been eating more carefully and has been exercising much. He has lost  20 pounds. 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 His BGs are more erratic at times. He has been allowing his BGs to be lower and tries hard not to over-compensate. He goes to the gym 3-5 times per week and walks for about an hour 2-4 times per week. He has been changing sites every 3-4 days. He uses Novolog aspart in his insulin pump. He wears the Medtronic [place] sensor most of the time. He reduced his enalapril to 10 mg once daily due to hypotension when exercising. He also takes 10 mg of atorvastatin once daily, Centrum Silver, and two 81 mg ASA per day. He continues to take [facility] as treatment for hepatitis. He had a FU appointment at [facility] hepatology clinic in November 2014. He will probably continue on Baraclude for the rest of his life.   4. Pertinent Review of Systems: Constitutional: The patient feels "pretty good". "Life is wonderful. I'm so happy."  He has few significant complaints. Eyes: Vision is good. His last eye exam was on June 25th 2014. There were no sign of diabetic eye disease at that visit. He has no significant  eye complaints. He needs to have a FU exam soon.  Neck:  His anterior neck has not been tender for several months. Heart: He occasionally has faster heart rate when he drinks too much coffee. Heart rate increases with exercise or other physical activity. He has not had many chest wall pains. The patient has no other complaints of irregular heat beats, chest pain, or chest pressure. Gastrointestinal: His reflux has resolved. He rarely has  constipation when he doesn't drink enough. He does have occasional abdominal bloating, usually after eating a lot of fresh vegetables. Hands: His hand rash and itching occasionally recur. Lamisil cream helps to relieve the rash.  Legs: Muscle mass and strength seem normal. There are no complaints of numbness, tingling, burning, or pain. No edema is noted.  Feet:  He continues to apply Lamasil cream as a preventive measure about twice a week  There are no complaints of numbness, tingling, burning, or pain. No edema is noted. Hypoglycemia: Low BGs occur 2-3 times per week, usually associated with  over-correcting higher BGs, with not eating when he is too busy, with not taking the time to attend to his BGs when they drop rapidly, or when he exercises. He has been using temporary basal rates, but really needs to start the lower rate about 30 minutes prior to exercise. Sometimes he subtracts points of BG after exercise, sometimes not. When he feels low he does not check his BG, but takes glucose immediately. His CGM keeps him out of trouble if he pays attention to the prompts. He is not intentionally keeping his BGs higher as often as he has in the past.    5 . BG printout: He changes sites every 2-4 days. He has had many more good sites than bad sites. His new sites are generally working better.  He is having some 40s, 50s, and 60s, mostly after exercise or correcting a BG > 250. His higher BGs occur when sites go bad on the 4th days, when he over-treats his low BGs, when he is stressed, or when he is sick. If his sites stay in for longer than 3 days the BGs tend to deteriorate rapidly. He checks his BGs 3-8 times per day. He also boluses 3-8 times daily. Some carb counts are better than others. He still sometimes gives a correction bolus based upon his CGM value rather than checking BGs. He also sometimes just gives manual boluses. His average fingerstick BG is 198, versus 188 at last visit.  6. CGM sensor  readout: He is changing his sites about every 4 days. His CGM His two BGs > 400 occurred on the 4th day after site changes. His best BGs tend to occur on the day of site changes and on the following two days. Some sites work better than others. His average BG was 197, compared with 170 at last visit. He still has a great deal of variability, mostly related to eating out at restaurants, during and after exercise, site changes, or when he is not as engaged in managing his BGs. His low BGs tend to occur during and after exercise. He has had two "Threshold suspends"  in the evenings after exercise.   PAST MEDICAL, FAMILY, AND SOCIAL HISTORY:  1. 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 He and his partner live in [place]. They were married in the [address] of [place].  2. He is walking and working out much more.   3. Primary care provider: Dr.  Sharlot GowdaJohn Lalonde  REVIEW OF SYSTEMS: Samuel Valentine does not have any significant problems involving his other body systems.  PHYSICAL EXAM: BP 119/81  Pulse 89  Wt 201 lb (91.173 kg)  He has lost 20 pounds.  Constitutional: The patient looks healthy and quite good overall. He has slimmed down since last visit. He appears physically and emotionally well. His affect and engagement are very normal.  Eyes: There is no arcus or proptosis.  Mouth: The oropharynx appears normal. The tongue appears normal. There is normal oral moisture. There is no obvious gingivitis. Neck: There are no bruits present. The thyroid gland appears slightly enlarged. The thyroid gland is larger at 21-22 grams in size. The left  lobe is more enlarged, somewhat firmer, and still mildly tender today.  The right lobe is at the upper limit of normal size. Lungs: The lungs are clear. Air movement is good. Heart: The heart rhythm and rate appear normal. Heart sounds S1 and S2 are normal. I do not appreciate any pathologic heart murmurs. Abdomen: The abdomen is enlarged, but smaller. Bowel  sounds are normal. The abdomen is soft and non-tender. There is no obviously palpable hepatomegaly, splenomegaly, or other masses.  Arms: Muscle mass appears appropriate for age.  Hands: There is no obvious tremor. Phalangeal and metacarpophalangeal joints appear normal. His rash has resolved.  Legs: Muscle mass appears appropriate for age. There is no edema.  Feet: There are no significant deformities. Dorsalis pedis pulses are 1+ on the right and 2+ on the left. His feet look good. He has a new plantar wart of the left mid-foot. He has no obvious tinea pedis of the feet today.  Neurologic: Muscle strength is normal for age and gender in both the upper and the lower extremities. Muscle tone appears normal. Sensation to touch is normal in the legs and in the feet today.   LAB: HbA1c today is 7.7% today, compared with 7.0% at last visit and with 7.2% at prior visit.    Labs 12/06/12: CMP shows normal AST and ALT. Cholesterol 136, triglycerides 74, HDL 47 (decreased from 58 when he was exercising), LDL 74; microalbumin/creatinine ratio 2.3; TSH 1.782, free T4 0.98, free T3 3.1  Labs 02/03/12: CMP: Normal; TSH 1.565, free T4 0.98, free T3 3.0; cholesterol 156, triglycerides 125, HDL 58, LDL 73; Urinary microalbumin/creatinine ratio 4.2.  ASSESSMENT:  1. T1DM: His BG control is worse by A1c and by average BG, but he is also not having as many low BGs. It is clear that when his sites stay in for more than three days, BGs tend to rise rapidly. He is doing a much better job of basing his correction doses on BG values. He is not doing as much late night snacking.   2. Hypoglycemia: He is having fewer low BGs. Some low BGs occur when he over-corrected higher BGs. Some lows occur if he is delayed in eating. Some low BGs occur during exercise or several hours after exercise, especially if he forgot to subtract 50-100-150 points of BG after exercise.   3. Thyroiditis: Clinically active today, c/w ongoing  thyroiditis.. 4. Goiter: The thyroid gland has increased somewhat in size since last visit. The waxing and waning of thyroid gland size is c/w evolving Hashimoto's disease.  He was euthyroid in March 2014. The order is already written to have his annual lab tests done.   5. Hypertension: Diastolic BP is somewhat higher. He needs to continue to exercise and to lose weight.  6.  Hyperlipidemia: Lipid panel results in March 2014 were good overall. We need to repeat the lipid panel now.  7. Autonomic neuropathy, tachycardia, and gastroparesis: With the improvement in BG control he has had, his neuropathy, tachycardia, and gastroparesis have all improved.  8. Peripheral neuropathy: He is asymptomatic today. He has no evidence of neuropathy on physical exam.  9. Adjustment reaction: He is doing very well today.   PLAN:  1. Diagnostic: HbA1c today.  Annual surveillance labs within the next two weeks.   2. Therapeutic:   A. Try to change pump site every three days. Use Lamisil or other anti-fungal agent for tinea pedis. Continue enalapril and atorvastatin. Eat right. Exercise right. Don't try too hard to avoid hypoglycemia. Be prepared to reduce basal rates and/or to use temporary basal rates when exercising. Please subtract 50-100-150 points of BG after exercise.  B. New basal rates: MN: 0.850 3 AM: 1.30 4:30 AM: 1.40 8 AM: 1.15 12:00 noon: 0.850 7 PM: 1.10 3. Patient education: We discussed life style changes at length. He has been much better at taking care of his DM in the past 6 months.  4. Follow up: FU appointment in 3 months.  Level of Service: This visit lasted in excess of 65 minutes. More than 50% of the visit was devoted to counseling.  David Stall

## 2014-06-26 MED ORDER — INSULIN ASPART 100 UNIT/ML CARTRIDGE (PENFILL): SUBCUTANEOUS | Status: DC

## 2014-06-26 NOTE — Addendum Note (Signed)
Addended by: David StallBRENNAN, Babara [place] J on: 06/26/2014 12:48 PM   Modules accepted: Orders

## 2014-07-05 ENCOUNTER — Other Ambulatory Visit: Payer: Self-pay

## 2014-07-05 ENCOUNTER — Other Ambulatory Visit: Payer: Self-pay | Admitting: "Endocrinology

## 2014-07-22 ENCOUNTER — Encounter: Payer: Self-pay | Admitting: *Deleted

## 2014-07-22 NOTE — Progress Notes (Signed)
On 06/19/14 patient was given 1 lantus pen lot# [id number], exp 5/17. KW

## 2014-08-13 ENCOUNTER — Other Ambulatory Visit: Payer: Self-pay | Admitting: "Endocrinology

## 2014-08-13 ENCOUNTER — Other Ambulatory Visit: Payer: Self-pay | Admitting: *Deleted

## 2014-08-13 DIAGNOSIS — E1065 Type 1 diabetes mellitus with hyperglycemia: Secondary | ICD-10-CM

## 2014-08-13 DIAGNOSIS — IMO0002 Reserved for concepts with insufficient information to code with codable children: Secondary | ICD-10-CM

## 2014-09-13 ENCOUNTER — Other Ambulatory Visit: Payer: Self-pay | Admitting: "Endocrinology

## 2014-09-23 ENCOUNTER — Ambulatory Visit: Payer: BC Managed Care – PPO | Admitting: Family Medicine

## 2014-09-23 ENCOUNTER — Encounter: Payer: Self-pay | Admitting: Family Medicine

## 2014-09-23 ENCOUNTER — Ambulatory Visit (INDEPENDENT_AMBULATORY_CARE_PROVIDER_SITE_OTHER): Payer: BLUE CROSS/BLUE SHIELD | Admitting: Family Medicine

## 2014-09-23 VITALS — BP 118/78 | HR 81 | Temp 99.9°F | Wt 214.0 lb

## 2014-09-23 DIAGNOSIS — B1910 Unspecified viral hepatitis B without hepatic coma: Secondary | ICD-10-CM

## 2014-09-23 DIAGNOSIS — J0100 Acute maxillary sinusitis, unspecified: Secondary | ICD-10-CM

## 2014-09-23 DIAGNOSIS — A630 Anogenital (venereal) warts: Secondary | ICD-10-CM

## 2014-09-23 DIAGNOSIS — B169 Acute hepatitis B without delta-agent and without hepatic coma: Secondary | ICD-10-CM

## 2014-09-23 MED ORDER — CLARITHROMYCIN 500 MG PO TABS: 500.0000 mg | ORAL_TABLET | Freq: Two times a day (BID) | ORAL | Status: DC

## 2014-09-23 NOTE — Progress Notes (Signed)
   Subjective:    Patient ID: Samuel Valentine, male    DOB: 1971-08-13, 44 y.o.   MRN: [id number]  HPI He has a five-day history that started with a slight sore throat and sinus congestion followed by rhinorrhea, PND, dry cough,  with hoarse voice, slight fever and chills. He also has upper tooth discomfort No earache. He has a history of hepatitis B however his most recent blood work did show a negative antigen. He continues on his regular protocol for diabetes. He states his last hemoglobin A1c was 7. He also notes that he has a few anal lesions that he would like me to high for K.  Review of Systems     Objective:   Physical Exam alert and in no distress. Nasal mucosa is normal. Tender over maxillary sinuses. Tympanic membranes and canals are normal. Throat is clear. Tonsils are normal. Neck is supple without adenopathy or thyromegaly. Cardiac exam shows a regular sinus rhythm without murmurs or gallops. Lungs are clear to auscultation.        Assessment & Plan:  Hepatitis B infection without delta agent without hepatic coma, unspecified chronicity  Acute maxillary sinusitis, recurrence not specified - Plan: clarithromycin (BIAXIN) 500 MG tablet  Anal warts  he is to call if not entirely better when he finishes the antibiotic. He will also set up an appointment for hyfrecation of the warts. He will continue to be followed for his underlying hepatitis B although it does look like he might have cleared this.

## 2014-09-23 NOTE — Patient Instructions (Signed)
If you're not totally back to normal when you finish, give me a call. 

## 2014-10-01 ENCOUNTER — Ambulatory Visit: Payer: BC Managed Care – PPO | Admitting: "Endocrinology

## 2014-11-19 ENCOUNTER — Encounter: Payer: Self-pay | Admitting: "Endocrinology

## 2014-11-19 ENCOUNTER — Ambulatory Visit (INDEPENDENT_AMBULATORY_CARE_PROVIDER_SITE_OTHER): Payer: BC Managed Care – PPO | Admitting: "Endocrinology

## 2014-11-19 VITALS — BP 123/75 | HR 85 | Wt 209.5 lb

## 2014-11-19 DIAGNOSIS — I10 Essential (primary) hypertension: Secondary | ICD-10-CM

## 2014-11-19 DIAGNOSIS — E7800 Pure hypercholesterolemia, unspecified: Secondary | ICD-10-CM

## 2014-11-19 DIAGNOSIS — E063 Autoimmune thyroiditis: Secondary | ICD-10-CM

## 2014-11-19 DIAGNOSIS — E1043 Type 1 diabetes mellitus with diabetic autonomic (poly)neuropathy: Secondary | ICD-10-CM

## 2014-11-19 DIAGNOSIS — E1042 Type 1 diabetes mellitus with diabetic polyneuropathy: Secondary | ICD-10-CM | POA: Diagnosis not present

## 2014-11-19 DIAGNOSIS — E1065 Type 1 diabetes mellitus with hyperglycemia: Secondary | ICD-10-CM | POA: Diagnosis not present

## 2014-11-19 DIAGNOSIS — E162 Hypoglycemia, unspecified: Secondary | ICD-10-CM | POA: Diagnosis not present

## 2014-11-19 DIAGNOSIS — E049 Nontoxic goiter, unspecified: Secondary | ICD-10-CM

## 2014-11-19 DIAGNOSIS — K3184 Gastroparesis: Secondary | ICD-10-CM

## 2014-11-19 DIAGNOSIS — IMO0002 Reserved for concepts with insufficient information to code with codable children: Secondary | ICD-10-CM

## 2014-11-19 DIAGNOSIS — G990 Autonomic neuropathy in diseases classified elsewhere: Secondary | ICD-10-CM

## 2014-11-19 LAB — LIPID PANEL
Cholesterol: 161 mg/dL (ref 0–200)
HDL: 67 mg/dL (ref >=40)
LDL Cholesterol: 78 mg/dL (ref 0–99)
TRIGLYCERIDES: 80 mg/dL (ref <150)
Total CHOL/HDL Ratio: 2.4 Ratio
VLDL: 16 mg/dL (ref 0–40)

## 2014-11-19 LAB — TSH: TSH: 1.357 u[IU]/mL (ref 0.350–4.500)

## 2014-11-19 LAB — GLUCOSE, POCT (MANUAL RESULT ENTRY): POC Glucose: 249 mg/dl — AB (ref 70–99)

## 2014-11-19 LAB — HEMOGLOBIN A1C
Hgb A1c MFr Bld: 8 % — ABNORMAL HIGH (ref <5.7)
Mean Plasma Glucose: 183 mg/dL — ABNORMAL HIGH (ref <117)

## 2014-11-19 LAB — MICROALBUMIN / CREATININE URINE RATIO
Creatinine, Urine: 79.6 mg/dL
Microalb Creat Ratio: 2.5 mg/g (ref 0.0–30.0)
Microalb, Ur: 0.2 mg/dL (ref <2.0)

## 2014-11-19 LAB — T3, FREE: T3, Free: 3 pg/mL (ref 2.3–4.2)

## 2014-11-19 LAB — COMPREHENSIVE METABOLIC PANEL
ALK PHOS: 61 U/L (ref 39–117)
ALT: 20 U/L (ref 0–53)
AST: 21 U/L (ref 0–37)
Albumin: 4.2 g/dL (ref 3.5–5.2)
BILIRUBIN TOTAL: 0.6 mg/dL (ref 0.2–1.2)
BUN: 9 mg/dL (ref 6–23)
CALCIUM: 9.1 mg/dL (ref 8.4–10.5)
CO2: 28 meq/L (ref 19–32)
CREATININE: 0.96 mg/dL (ref 0.50–1.35)
Chloride: 99 mEq/L (ref 96–112)
GLUCOSE: 179 mg/dL — AB (ref 70–99)
Potassium: 4.4 mEq/L (ref 3.5–5.3)
SODIUM: 136 meq/L (ref 135–145)
TOTAL PROTEIN: 6.8 g/dL (ref 6.0–8.3)

## 2014-11-19 LAB — T4, FREE: FREE T4: 1.04 ng/dL (ref 0.80–1.80)

## 2014-11-19 NOTE — Patient Instructions (Signed)
Follow up visit in 3 months. 

## 2014-11-19 NOTE — Progress Notes (Signed)
CC: FU T1DM, hypoglycemia, hypertension, hyperlipidemia, autonomic neuropathy, tachycardia, gastroparesis, peripheral neuropathy, thyroiditis, goiter, hepatitis B, fatigue  HPI: [name] is a 44 y.o. Caucasian gentleman. He was unaccompanied.   1. [name] was diagnosed with T2DM about 2002-2003. He was treated with metformin, Avandia, and Amaryl, but BGs were not well controlled, so Lantus was begun in 2005. When it became apparent that he would need a multiple daily injection regimen with Lantus and Novolog, he was referred to me by Ms. Samuel CoffinAnn Clark, RN, CDE, of the [facility]. I saw him for the first time on 02/12/05. He had just been started on Novolog aspart insulin at meals (120/25/10 plan). His PMH included recurrent hypoglycemia, hypertension, dyslipidemia, and proteinuria. He had had oral surgery in the past, but no other surgery. His psychiatric diagnoses included depression, anxiety, and dissociative identity disorder. He was also allergic to penicillin.  Social history included the fact that he was gay. All HIV tests had been negative through that time.  He had recently been laid off and had no health insurance. In March 2006, his HbA1c had been 10.6%. On exam he had a goiter and mild peripheral neuropathy. 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 We accepted him as a charity patient and enrolled him in our Diabetes Survival Skills Program. He was converted to a Medtronic 508 insulin pump in August 2006.   2. During the next nine  years his DM self-care efforts and HbA1c values waxed and waned, with A1c values ranging from 6.4%-9.2%. His worst BGs occurred when he was being treated with interferon for hepatitis B, from which he is now in remission. He has been gainfully employed at [employer], [employer], and again at [employer] for several years and has had health insurance. In 2011 we changed him to a Medtronic 723 (Revel) pump and  started him on the Medtronic CGM sensor. He is now on a Medtronic 530G insulin pump. Since the introduction of CGM-pump therapy, his BGs have significantly improved.  3. His last [facility] visit was on 06/19/14. His HbA1c then was 7.7%, increased from 7.0% at the prior visit. In the interim he had a bad sinus infection for two months in December. He missed his FU appointment them. Since last visit he has "not been compliant with diet, exercise, and oral medications". He has been under a lot of stress at work and also has some "diabetes burnout". He has been changing sites every 5 days. He uses Novolog aspart in his insulin pump. He wears the Medtronic Enlite sensor most of the time. He reduced his enalapril to 10 mg once daily due to hypotension when exercising. He also takes 10 mg of atorvastatin once daily, Centrum Silver, and two 81 mg ASA per day. [facility] treatment for hepatitis. [facility] November [facility]. He had a very good report. He will probably be able to discontinue on Baraclude this coming May.    4. Pertinent Review of Systems: Constitutional: The patient feels "all right". "I'm so happy, except for taking care of my diabetes."  He has few significant complaints. Eyes: Vision is not as good since his BGs have been higher. His last eye exam was in August 2015. There were no sign of diabetic eye disease at that visit. He has no other significant eye complaints. He needs to schedule a FU exam this year.  Neck:  His anterior neck has not been tender for several months. Heart: He occasionally has faster heart rate when he drinks too much coffee. Heart rate also increases with exercise or other physical activity. He has not had any chest wall pains. The patient has no other complaints of irregular heat beats, chest pain, or chest pressure. Gastrointestinal: His reflux has recurred occasionally. He has constipation when he doesn't drink  enough fluid. He does have occasional abdominal bloating, usually after eating a lot of fresh vegetables. Hands: His hand rash has resolved after using Lamasil.  Legs: Muscle mass and strength seem normal. There are no complaints of numbness, tingling, burning, or pain. No edema is noted.  Feet:  He continues to apply Lamasil cream as a preventive measure about twice a week  There are no complaints of numbness, tingling, burning, or pain. No edema is noted. Hypoglycemia: Not often   5 . BG printout: He changes sites every 5  days. He has had many more good sites than bad sites. His new sites are generally working better.  He is having some 40s-60s, mostly if he eats late and does not use a temporary basal rate. His higher BGs occur when sites go bad on the 4th days, when he over-treats his low BGs, when he is stressed, when he is sick, or when he overindulges in carbs. If his sites stay in for longer than 3 days the BGs tend to deteriorate. He checks his BGs 2-6 times per day. He also boluses 4-8 times daily. Some carb counts are better than others. He still sometimes gives a correction bolus based upon his CGM value rather than checking BGs. He also sometimes just gives manual boluses. His average fingerstick BG is 198, versus 188 at last visit.  6. CGM sensor readout: Overall his sensor values and BG values correlate quite well. His two BGs > 400 occurred on the 4th day after site changes. His best BGs tend to occur on the day of site changes and on the following two days. Some sites work better than others. He still has a great deal of variability, mostly related to eating out at restaurants, during and after exercise, site changes, or when he is not as engaged in managing his BGs. He has had three "Threshold Suspends": one in the early morning, one at lunch, and one before a late dinner.   PAST MEDICAL, FAMILY, AND SOCIAL HISTORY:  1. 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 He and his  partner live in [place]. They were married in the [address] of [place].  2. He has not been walking or working ut much during the Winter.    3. Primary care provider: Dr. Sharlot Gowda  REVIEW OF SYSTEMS: Samuel Valentine does not have any significant problems involving his other body systems.  PHYSICAL EXAM: BP 123/75 mmHg  Pulse 85  Wt 209 lb 8 oz (95.029 kg)  He has gained 8 pounds since his last visit.  Constitutional: The patient looks healthy and quite good overall. He has obviously gained weight since last visit. He appears physically and emotionally well. His affect and engagement are very normal.  Eyes: There is no arcus or proptosis.  Mouth: The oropharynx appears normal. The tongue appears normal. There is normal oral moisture. There is no obvious gingivitis. Neck: There are no bruits present. The thyroid gland appears slightly enlarged. The thyroid gland is larger at 22-23 grams in size. The left  lobe is more enlarged,  somewhat firmer, and still mildly tender today.  The right lobe is a bit enlarged.  Lungs: The lungs are clear. Air movement is good. Heart: The heart rhythm and rate appear normal. Heart sounds S1 and S2 are normal. I do not appreciate any pathologic heart murmurs. Abdomen: The abdomen is enlarged, but smaller. Bowel sounds are normal. The abdomen is soft and non-tender. There is no obviously palpable hepatomegaly, splenomegaly, or other masses.  Arms: Muscle mass appears appropriate for age.  Hands: There is no obvious tremor. Phalangeal and metacarpophalangeal joints appear normal. His rash has resolved.  Legs: Muscle mass appears appropriate for age. There is no edema.  Feet: There are no significant deformities. Dorsalis pedis pulses are 1-2+ bilaterally. His feet look good. He has no obvious tinea pedis of the feet today.  Neurologic: Muscle strength is normal for age and gender in both the upper and the lower extremities. Muscle tone appears normal. Sensation to  touch is normal in the legs, but slightly decreased in the left heel today.    LABS:   Labs 11/18/14; HbA1c 8.9%, compared with 7.7% at last visit and with 7.0% at the visit prior. CMP normal except for glucose of 173, normal AST and ALT; cholesterol 161, triglycerides 8, HDL 67, LDL 78; urinary microalbumin/creatinine ratio 2.5; TSH 1.357, free T4 1.04, free T3 3.0  Labs 12/06/12: CMP shows normal AST and ALT. Cholesterol 136, triglycerides 74, HDL 47 (decreased from 58 when he was exercising), LDL 74; microalbumin/creatinine ratio 2.3; TSH 1.782, free T4 0.98, free T3 3.1  Labs 02/03/12: CMP: Normal; TSH 1.565, free T4 0.98, free T3 3.0; cholesterol 156, triglycerides 125, HDL 58, LDL 73; Urinary microalbumin/creatinine ratio 4.2.  ASSESSMENT:  1. T1DM: His BG control is worse by A1c, but better by the average BG in the past month. That discrepancy indicates that his BGs were even higher 2-3 months ago.  As he said at the start of the visit he has not been as compliant with his DM self-care tasks as he should have been.  It is clear that when his sites stay in for more than three days, BGs tend to rise. He is not doing as well with his daily DM self-care tasks.    2. Hypoglycemia: He is having fewer low BGs. At this visit most of his low BGs occur if he delays meals too long. This is a perfect setting for using Temporary Basal Rates.  3. Thyroiditis: Clinically active today, c/w ongoing thyroiditis.. 4. Goiter: The thyroid gland has increased a bit in size since his last visit. The waxing and waning of thyroid gland size is c/w evolving Hashimoto's disease.  He was euthyroid in March 2014. His TFTS yesterday were mid-range normal.  5. Hypertension: BP is better. He needs to continue to exercise and to lose weight.  6. Hyperlipidemia: Lipid panel results in March 2014 and again yesterday were good overall.  7. Autonomic neuropathy, tachycardia, and gastroparesis: With the improvement in BG control he  had had, his neuropathy, tachycardia, and gastroparesis had all improved. Now, however, if he does not improve his BG control all three of these problems will worsen.  8. Peripheral neuropathy: His neuropathy is very mild today, but worse than at last visit due to his increase in BGs.  9. Adjustment reaction: He is doing very fairly well today.   PLAN:  1. Diagnostic: HbA1c at next visit.   2. Therapeutic:   A. Get back on the wagon. Try to change  pump site every three days. Use Lamisil or other anti-fungal agent for tinea pedis. Continue enalapril and atorvastatin. Eat right. Exercise right. Don't try too hard to avoid hypoglycemia. Be prepared to reduce basal rates and/or to use temporary basal rates when exercising or eating late. Please subtract 50-100-150 points of BG after exercise.  B. Continue basal rates: MN: 0.850 3 AM: 1.30 4:30 AM: 1.40 8 AM: 1.15 12:00 noon: 0.850 7 PM: 1.10 3. Patient education: We discussed life style changes at length. He needs to get back on track.   4. Follow up: FU appointment in 3 months.  Level of Service: This visit lasted in excess of 65 minutes. More than 50% of the visit was devoted to counseling.  David Stall

## 2014-12-01 ENCOUNTER — Other Ambulatory Visit: Payer: Self-pay | Admitting: "Endocrinology

## 2014-12-23 ENCOUNTER — Telehealth: Payer: Self-pay | Admitting: *Deleted

## 2014-12-23 NOTE — Telephone Encounter (Signed)
LVM to call and reschedule 6/1 appt.

## 2015-01-26 ENCOUNTER — Other Ambulatory Visit: Payer: Self-pay | Admitting: "Endocrinology

## 2015-01-29 ENCOUNTER — Ambulatory Visit (INDEPENDENT_AMBULATORY_CARE_PROVIDER_SITE_OTHER): Payer: BC Managed Care – PPO | Admitting: Family Medicine

## 2015-01-29 ENCOUNTER — Encounter: Payer: Self-pay | Admitting: Family Medicine

## 2015-01-29 VITALS — BP 130/80 | HR 67 | Wt 215.2 lb

## 2015-01-29 DIAGNOSIS — A630 Anogenital (venereal) warts: Secondary | ICD-10-CM | POA: Diagnosis not present

## 2015-01-29 NOTE — Progress Notes (Signed)
   Subjective:    Patient ID: Samuel DamesJeffrey Valentine, male    DOB: 11-21-1970, 44 y.o.   MRN: [id number]  HPI He is here for evaluation of 2 lesions in the anal area. He has a previous history of anal warts.   Review of Systems     Objective:   Physical Exam Exam of the anal area does show a small wart present in the 6:00 position approximately 2 cm into the cleft area. He also has one on the perianal area around 10:00.       Assessment & Plan:  Anal warts The lesions were injected with Xylocaine and hyfrecated without difficulty.

## 2015-02-19 ENCOUNTER — Ambulatory Visit: Payer: BC Managed Care – PPO | Admitting: "Endocrinology

## 2015-03-17 ENCOUNTER — Other Ambulatory Visit: Payer: Self-pay

## 2015-03-20 ENCOUNTER — Other Ambulatory Visit: Payer: Self-pay | Admitting: "Endocrinology

## 2015-04-07 ENCOUNTER — Ambulatory Visit (INDEPENDENT_AMBULATORY_CARE_PROVIDER_SITE_OTHER): Payer: BC Managed Care – PPO | Admitting: Family Medicine

## 2015-04-07 ENCOUNTER — Encounter: Payer: Self-pay | Admitting: Family Medicine

## 2015-04-07 VITALS — BP 108/68 | HR 86 | Temp 99.6°F | Wt 206.8 lb

## 2015-04-07 DIAGNOSIS — J069 Acute upper respiratory infection, unspecified: Secondary | ICD-10-CM

## 2015-04-07 DIAGNOSIS — B181 Chronic viral hepatitis B without delta-agent: Secondary | ICD-10-CM

## 2015-04-07 NOTE — Progress Notes (Signed)
   Subjective:    Patient ID: Samuel Valentine, male    DOB: 27-Dec-1970, 44 y.o.   MRN: [id number]  HPI  5 days ago he had the onset of malaise, rhinorrhea and dry cough followed by myalgias and chills. No sore throat, earache , fever. These symptoms have continued be does state that he is feeling better but still having difficulty with a cough. He has been using DayQuil and NyQuil. He also states that he was told that he has finally cleared his hepatitis B and is apparently antigen negative.   Review of Systems     Objective:   Physical Exam Alert and in no distress. Tympanic membranes and canals are normal. Pharyngeal area is normal. Neck is supple without adenopathy or thyromegaly. Cardiac exam shows a regular sinus rhythm without murmurs or gallops. Lungs are clear to auscultation.        Assessment & Plan:  Chronic viral hepatitis B without delta agent and without coma  Acute URI  I explained that since he is feeling better, no  antibiotic is really needed. He is to continue to treat his symptoms. Call if further difficulty.

## 2015-04-07 NOTE — Patient Instructions (Signed)
Use the DayQuil and NyQuil and also use 2 Aleve twice a day and you can take Tylenol

## 2015-04-11 ENCOUNTER — Telehealth: Payer: Self-pay | Admitting: Internal Medicine

## 2015-04-11 MED ORDER — CLARITHROMYCIN 500 MG PO TABS: 500.0000 mg | ORAL_TABLET | Freq: Two times a day (BID) | ORAL | Status: DC

## 2015-04-11 NOTE — Telephone Encounter (Signed)
Let him know that I called something in 

## 2015-04-11 NOTE — Telephone Encounter (Signed)
Pt called stating he is not better from when he saw you this past week. He is still coughing and the phelgm is yellow and brown. Please send an antibiotic to [facility] [address]

## 2015-04-11 NOTE — Telephone Encounter (Signed)
Pt informed and verbalized understanding

## 2015-04-27 ENCOUNTER — Other Ambulatory Visit: Payer: Self-pay | Admitting: "Endocrinology

## 2015-06-02 ENCOUNTER — Encounter: Payer: Self-pay | Admitting: Family Medicine

## 2015-06-02 ENCOUNTER — Ambulatory Visit (INDEPENDENT_AMBULATORY_CARE_PROVIDER_SITE_OTHER): Payer: BC Managed Care – PPO | Admitting: Family Medicine

## 2015-06-02 VITALS — BP 126/80 | HR 88 | Ht 73.0 in | Wt 212.0 lb

## 2015-06-02 DIAGNOSIS — Z1339 Encounter for screening examination for other mental health and behavioral disorders: Secondary | ICD-10-CM

## 2015-06-02 DIAGNOSIS — F901 Attention-deficit hyperactivity disorder, predominantly hyperactive type: Secondary | ICD-10-CM | POA: Diagnosis not present

## 2015-06-02 DIAGNOSIS — Z1389 Encounter for screening for other disorder: Principal | ICD-10-CM

## 2015-06-02 MED ORDER — AMPHETAMINE-DEXTROAMPHETAMINE 10 MG PO TABS: 10.0000 mg | ORAL_TABLET | Freq: Every day | ORAL | Status: DC

## 2015-06-02 NOTE — Progress Notes (Signed)
   Subjective:    Patient ID: Samuel Valentine, male    DOB: 03-20-1971, 44 y.o.   MRN: [id number]  HPI He is here for consult concerning possible ADD. He states that he has had difficulty with focusing his entire life however when he was young he was able to get good grades and his lack of focus really didn't interfere. He is now working at [employer] in [employer]. He recently finished his undergraduate degree and is interested in postgraduate work. He states that by the end of the day he has utilized all of his coping mechanisms and is exhausted. He does complain of decreased focus, being easily distracted, fidgety, avoiding certain activities and not following through.   Review of Systems     Objective:   Physical Exam Alert and in no distress. Adult ADHD questionnaire score was 48.       Assessment & Plan:  ADHD (attention deficit hyperactivity disorder) evaluation - Plan: amphetamine-dextroamphetamine (ADDERALL) 10 MG tablet  Attention-deficit hyperactivity disorder, predominantly hyperactive type He does indeed reach the DSM criteria for ADHD. He has had symptoms his whole life but has been able to work around it. Now that he is essentially trying to go back to school he wants to be able to be less distracted and be able to stay on task without using various fairly effective coping mechanisms. I will place him on 10 mg of Adderall. Discussed with him the fact that he needs to let me know if it works, how long it works and if he has any problems with this. Over 25 minutes, greater than 50% spent in counseling and coordination of care.

## 2015-06-02 NOTE — Patient Instructions (Signed)
Need to know if it works, how long doesn't work and do you have any problems with it.

## 2015-06-09 ENCOUNTER — Telehealth: Payer: Self-pay | Admitting: Family Medicine

## 2015-06-09 NOTE — Telephone Encounter (Signed)
Pt called and stated that the medication he was given for ADD is taking the edge off but it is only lasting about 4 hours. He has been taking one in the am and one in the pm. He also states that he might need a stronger dose although he can tell a difference. Please call pt on his cell phone.

## 2015-06-09 NOTE — Telephone Encounter (Signed)
Left word for word message on pt cell # 

## 2015-06-09 NOTE — Telephone Encounter (Signed)
Have him take 1-1/2 pills twice per day and let us know how that works

## 2015-06-16 ENCOUNTER — Telehealth: Payer: Self-pay | Admitting: Family Medicine

## 2015-06-16 MED ORDER — AMPHETAMINE-DEXTROAMPHETAMINE 15 MG PO TABS: 15.0000 mg | ORAL_TABLET | Freq: Every day | ORAL | Status: DC

## 2015-06-16 NOTE — Telephone Encounter (Signed)
Pt informed and has appt 07/17/15

## 2015-06-16 NOTE — Telephone Encounter (Signed)
Pt requesting refill on Adderall with new dosing. Pt says he is taking 1 1/2 pills of  tablets twice a day per new directions. Call pt at [phone] when script is ready for pick up

## 2015-06-16 NOTE — Telephone Encounter (Signed)
Let him know that I wrote a one-month supply of the 10 mg twice per day and have him check with [place] in one month. Tell him to set up an appointment

## 2015-06-16 NOTE — Telephone Encounter (Signed)
He states that 1-1/2 of the 10 mg pills is working well when he takes this twice per day. I will give him a one-month supply and have him call back at that time.

## 2015-06-21 ENCOUNTER — Telehealth: Payer: Self-pay | Admitting: Family Medicine

## 2015-06-21 NOTE — Telephone Encounter (Signed)
Recv'd P.A. For #60 with once daily dosing, called pharmacy to change dosage to BID but pt already paid cash & picked up. Initiated P.A. For BID dosage #60/30

## 2015-06-24 NOTE — Telephone Encounter (Signed)
Approved 05/31/15 til 06/20/16

## 2015-06-24 NOTE — Telephone Encounter (Signed)
P.A. Approved til 06/20/16, pharmacy informed, Pt informed

## 2015-07-17 ENCOUNTER — Ambulatory Visit (INDEPENDENT_AMBULATORY_CARE_PROVIDER_SITE_OTHER): Payer: BC Managed Care – PPO | Admitting: Family Medicine

## 2015-07-17 ENCOUNTER — Encounter: Payer: Self-pay | Admitting: Family Medicine

## 2015-07-17 VITALS — BP 112/76 | HR 76 | Wt 206.0 lb

## 2015-07-17 DIAGNOSIS — F902 Attention-deficit hyperactivity disorder, combined type: Secondary | ICD-10-CM

## 2015-07-17 MED ORDER — AMPHETAMINE-DEXTROAMPHETAMINE 20 MG PO TABS: 20.0000 mg | ORAL_TABLET | Freq: Two times a day (BID) | ORAL | Status: DC

## 2015-07-17 NOTE — Progress Notes (Signed)
   Subjective:    Patient ID: Samuel DamesJeffrey Valentine, male    DOB: August 17, 1971, 44 y.o.   MRN: [id number]  HPI He is here for follow-up visit concerning his ADHD. He started out on 10 mg twice a day and then increased to 15. He did note an improvement in his focus. Also apparently quieted his mind down. He noted that he did not need his coping skills as much since starting the medication. In general he is doing quite well. The medicine last roughly 4 hours. He does note some slight fatigue later on in the early evening but overall is quite happy with the medication. He would like to try a higher dose to see if again he gets an improvement in his focus.   Review of Systems     Objective:   Physical Exam Alert and in no distress otherwise not examined       Assessment & Plan:  ADHD (attention deficit hyperactivity disorder) evaluation - Plan: amphetamine-dextroamphetamine (ADDERALL) 20 MG tablet he will be given the 20 mg pill. He will let me know whether this works or not. If no improvement, he will drop back to the 15 mg.

## 2015-07-19 ENCOUNTER — Other Ambulatory Visit: Payer: Self-pay | Admitting: "Endocrinology

## 2015-08-25 ENCOUNTER — Telehealth: Payer: Self-pay | Admitting: Family Medicine

## 2015-08-25 DIAGNOSIS — F902 Attention-deficit hyperactivity disorder, combined type: Secondary | ICD-10-CM

## 2015-08-25 MED ORDER — AMPHETAMINE-DEXTROAMPHETAMINE 20 MG PO TABS: 20.0000 mg | ORAL_TABLET | Freq: Two times a day (BID) | ORAL | Status: DC

## 2015-08-25 NOTE — Telephone Encounter (Signed)
Pt called for refill of Adderall. Please call [phone] when ready. 20 mg twice daily.

## 2015-10-01 ENCOUNTER — Ambulatory Visit (INDEPENDENT_AMBULATORY_CARE_PROVIDER_SITE_OTHER): Payer: BC Managed Care – PPO | Admitting: "Endocrinology

## 2015-10-01 ENCOUNTER — Encounter: Payer: Self-pay | Admitting: "Endocrinology

## 2015-10-01 VITALS — BP 142/80 | HR 87 | Wt 206.0 lb

## 2015-10-01 DIAGNOSIS — E109 Type 1 diabetes mellitus without complications: Secondary | ICD-10-CM

## 2015-10-01 DIAGNOSIS — E049 Nontoxic goiter, unspecified: Secondary | ICD-10-CM

## 2015-10-01 DIAGNOSIS — I10 Essential (primary) hypertension: Secondary | ICD-10-CM

## 2015-10-01 DIAGNOSIS — E1042 Type 1 diabetes mellitus with diabetic polyneuropathy: Secondary | ICD-10-CM | POA: Diagnosis not present

## 2015-10-01 DIAGNOSIS — R231 Pallor: Secondary | ICD-10-CM

## 2015-10-01 DIAGNOSIS — E1043 Type 1 diabetes mellitus with diabetic autonomic (poly)neuropathy: Secondary | ICD-10-CM | POA: Diagnosis not present

## 2015-10-01 DIAGNOSIS — IMO0001 Reserved for inherently not codable concepts without codable children: Secondary | ICD-10-CM

## 2015-10-01 DIAGNOSIS — E063 Autoimmune thyroiditis: Secondary | ICD-10-CM

## 2015-10-01 DIAGNOSIS — E10649 Type 1 diabetes mellitus with hypoglycemia without coma: Secondary | ICD-10-CM | POA: Diagnosis not present

## 2015-10-01 DIAGNOSIS — R000 Tachycardia, unspecified: Secondary | ICD-10-CM

## 2015-10-01 DIAGNOSIS — E1065 Type 1 diabetes mellitus with hyperglycemia: Principal | ICD-10-CM

## 2015-10-01 DIAGNOSIS — E782 Mixed hyperlipidemia: Secondary | ICD-10-CM

## 2015-10-01 LAB — POCT GLYCOSYLATED HEMOGLOBIN (HGB A1C): HEMOGLOBIN A1C: 7.8

## 2015-10-01 LAB — GLUCOSE, POCT (MANUAL RESULT ENTRY): POC GLUCOSE: 113 mg/dL — AB (ref 70–99)

## 2015-10-01 NOTE — Progress Notes (Signed)
CC: FU T1DM, hypoglycemia, hypertension, hyperlipidemia, autonomic neuropathy, tachycardia, gastroparesis, peripheral neuropathy, thyroiditis, goiter, hepatitis B, fatigue  HPI: Samuel Valentine is a 45 y.o. Caucasian gentleman. He was unaccompanied.   1. Samuel Valentine was diagnosed with T2DM about 2002-2003. He was treated with metformin, Avandia, and Amaryl, but BGs were not well controlled, so Lantus was begun in 2005. When it became apparent that he would need a multiple daily injection regimen with Lantus and Novolog, he was referred to me by [name], RN, CDE, of the [facility]. I saw him for the first time on 02/12/05. He had just been started on Novolog aspart insulin at meals (120/25/10 plan). His PMH included recurrent hypoglycemia, hypertension, dyslipidemia, and proteinuria. He had had oral surgery in the past, but no other surgery. His psychiatric diagnoses included depression, anxiety, and dissociative identity disorder. He was also allergic to penicillin.  Social history included the fact that he was gay. All HIV tests had been negative through that time.  He had recently been laid off and had no health insurance. In March 2006, his HbA1c had been 10.6%. On exam he had a goiter and mild peripheral neuropathy. 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 We accepted him as a charity patient and enrolled him in our Diabetes Survival Skills Program. He was converted to a Medtronic 508 insulin pump in August 2006.   2. During the next ten  years his DM self-care efforts and HbA1c values waxed and waned, with A1c values ranging from 6.4%-9.2%. His worst BGs occurred when he was being treated with interferon for hepatitis B, from which he is now in remission. He has been gainfully employed at [employer], [employer], and again at [employer] for several years and has had health insurance. In 2011 we changed him to a Medtronic 723 (Revel) pump and  started him on the Medtronic CGM sensor. He is now on a Medtronic 530G insulin pump. Since the introduction of CGM-pump therapy, his BGs have significantly improved when he has been consistent with his T1DM self-care.    3. His last [facility] visit was on 11/19/14. His HbA1c then was 8.9%, increased from 7.7% at the prior visit. In the interim he had had recurrent sinus problems, but has otherwise been healthy. He admits that he has not been compliant with his DM care in the past 3-6 months. He has been changing sites about every 5 days. He uses Novolog aspart in his insulin pump. He wears the Medtronic Enlite sensor most of the time. He has been taking enalapril, 10 mg once daily., but less when exercising. He also takes 10 mg of atorvastatin once daily, Centrum Silver, and two 81 mg ASA per day. He no longer takes [place]. He is also on Adderal for ADD. His work situation and marriage are doing well.     4. Pertinent Review of Systems: Constitutional: The patient feels "pretty good for the most part". "I'm often too stressed."  He has few physical complaints. Eyes: Vision is pretty good. His last eye exam was in August 2015. He needs to schedule a follow up exam at [facility].  Neck:  His anterior neck has not been tender. Heart: He occasionally has faster heart rate when he drinks too much coffee. Heart rate also increases with exercise or other physical activity. He has not had any chest wall pains. The patient has no other complaints of irregular heat beats, chest pain, or  chest pressure. Gastrointestinal: His reflux has not often been an issue for him. He does have occasional upset stomach. He has constipation occasionally if he doesn't drink enough fluid. He does have occasional abdominal bloating, usually after eating a lot of fresh vegetables. Hands: His hand rash resolved after using Lamasil. He still uses Lamisil 1-2 times per week.  Legs: Muscle mass and strength seem normal. He has  occasional tingling. There are no complaints of numbness, burning, or pain. No edema is noted.  Feet:  He continues to apply Lamasil cream as a preventive measure about twice a week  His feet are often cold and sometimes tingle. There are no complaints of numbness, burning, or pain. No edema is noted. Hypoglycemia: Not often   5 . BG printout: He changes sites every 5  days. His results are variable based upon the number of BG checks he does, how often he uses his sensor, the intensity and duration of exercise, whether or not he is sick, and the tendency of his sites to go bad with prolonged use. Some sites do work better than others. He boluses from 4-9 times per day. He still often gives correction boluses based upon his CGM value rather than checking BGs. He also sometimes just gives manual boluses. His average BG is 237, compared with 198 at last visit and 188 at the visit prior. In the past two weeks he has had four Threshold Suspends and one BG value of 40 that should have triggered a Threshold Suspend, but did not.   6. CGM sensor readout: Overall his sensor values and BG values correlate quite well. BGs average about 190 during the night, 150 at 8 AM, 180 at noon, 240 at 4 PM, 200 at dinner, and 190 at midnight. His tightest "shot group" of the day is about 7:30 AM. His area of widest BG variation is between 1-6 PM.  PAST MEDICAL, FAMILY, AND SOCIAL HISTORY:  1. 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 He [place] in [place]. [place]  2. He has not been walking or working out much during the Winter.    3. Primary care provider: Dr. Sharlot GowdaJohn Valentine  REVIEW OF SYSTEMS: Samuel Valentine does not have any significant problems involving his other body systems.  PHYSICAL EXAM: BP 142/80 mmHg  Pulse 87  Wt 206 lb (93.441 kg)  He has lost 3.5 pounds since his last visit.  Constitutional: The patient looks healthy and quite good overall. He  appears physically and emotionally well. His affect and engagement are very normal.  Eyes: There is no arcus or proptosis.  Mouth: The oropharynx appears normal. The tongue appears normal. There is normal oral moisture. There is no obvious gingivitis. Neck: There are no bruits present. The thyroid gland appears slightly enlarged. The thyroid gland is smaller at 21-22 grams in size. Both lobes are only minimally enlarged. The consistency of the thyroid gland is normal. Both lobes are mildly tender to palpation today.   Lungs: The lungs are clear. Air movement is good. Heart: The heart rhythm and rate appear normal. Heart sounds S1 and S2 are normal. I do not appreciate any pathologic heart murmurs. Abdomen: The abdomen is enlarged, but smaller. Bowel sounds are normal. The abdomen is soft and non-tender. There is no obviously palpable hepatomegaly, splenomegaly, or other masses.  Arms: Muscle mass appears appropriate for age.  Hands: There is no obvious tremor. Phalangeal and  metacarpophalangeal joints appear normal. His rash has resolved. He has some fingernail pallor today. Legs: Muscle mass appears appropriate for age. There is no edema.  Feet: There are no significant deformities. Dorsalis pedis pulses are 1-2+ bilaterally. His feet look good. He has no obvious tinea pedis of the feet today.  Neurologic: Muscle strength is normal for age and gender in both the upper and the lower extremities. Muscle tone appears normal. Sensation to touch is normal in the legs, but slightly decreased in both heels today.   Hypoglycemia: He has had a few low BGs, but none severe.  LABS:   Labs 10/01/15: HbA1c 7.8%  Labs 11/18/14; HbA1c 8.9%, compared with 7.7% at last visit and with 7.0% at the visit prior. CMP normal except for glucose of 173, normal AST and ALT; cholesterol 161, triglycerides 8, HDL 67, LDL 78; urinary microalbumin/creatinine ratio 2.5; TSH 1.357, free T4 1.04, free T3 3.0  Labs 12/06/12: CMP  with normal AST and ALT. Cholesterol 136, triglycerides 74, HDL 47 (decreased from 58 when he was exercising), LDL 74; microalbumin/creatinine ratio 2.3; TSH 1.782, free T4 0.98, free T3 3.1  Labs 02/03/12: CMP: Normal; TSH 1.565, free T4 0.98, free T3 3.0; cholesterol 156, triglycerides 125, HDL 58, LDL 73; Urinary microalbumin/creatinine ratio 4.2.  ASSESSMENT:  1. T1DM: His BG control is better than I expected when he said at the start of the visit that he has not been taking care of his DM very well. He has actually been doing a pretty good job of wearing his sensor, checking BGs, and taking boluses, but he could do better. It is clear that when his sites stay in for more than three days, BGs tend to rise. It is also clear that the more he checks BGs and take both correction boluses and food boluses, the better his BGs have been.  2. Hypoglycemia: He is having some low BGs, but his sensor is identifying low BGs and usually triggering the Threshold Suspend function, so his BGs do not usually drop too low. He did have one event when the BG dropped to 40 and the Threshold Suspend function did not activate. He is not sure if he turned off the TS function that day.   3. Thyroiditis: Clinically active today, c/w ongoing thyroiditis.. 4. Goiter: The thyroid gland has decreased a bit in size since his last visit. The waxing and waning of thyroid gland size is c/w evolving Hashimoto's disease.  He was euthyroid in March 2014 and in February 2016.   5. Hypertension: BP is higher. He needs to continue to exercise and to lose weight.  6. Hyperlipidemia: Lipid panel results in March 2014 and again in February 2016 were good overall.  7. Autonomic neuropathy, tachycardia, and gastroparesis: With the improvement in BG control he has had, his neuropathy, tachycardia, and gastroparesis had all improved.  8. Peripheral neuropathy: His neuropathy is very mild today, but worse than at last visit.  9. Adjustment  reaction: He is doing very fairly well today.  10. Pallor: His fingernails are somewhat pallid today, but his color is good otherwise. We need to check him for anemia and iron deficiency.  PLAN:  1. Diagnostic: HbA1c today. Annual surveillance labs soon.    2. Therapeutic:   A. Take better care of your T1DM. Try to change pump sites every three days. Use Lamisil or other anti-fungal agent for tinea pedis. Continue enalapril and atorvastatin. Eat right. Exercise right. Don't try too hard to avoid hypoglycemia.  Be prepared to reduce basal rates and/or to use temporary basal rates when exercising or eating late. Please subtract 50-100-150 points of BG after exercise.  B. Continue basal rates: MN: 0.850 3 AM: 1.30 4:30 AM: 1.40 8 AM: 1.15 12:00 noon: 0.850 7 PM: 1.10 3. Patient education: We discussed life style changes at length. He needs to stay on track consistently.   4. Follow up: FU appointment in 3 months.  Level of Service: This visit lasted in excess of 95 minutes. More than 50% of the visit was devoted to counseling.  David Stall

## 2015-10-01 NOTE — Patient Instructions (Signed)
Follow up visit in 3 months. 

## 2015-12-15 ENCOUNTER — Other Ambulatory Visit: Payer: Self-pay | Admitting: "Endocrinology

## 2015-12-16 ENCOUNTER — Other Ambulatory Visit: Payer: Self-pay | Admitting: *Deleted

## 2015-12-16 DIAGNOSIS — IMO0001 Reserved for inherently not codable concepts without codable children: Secondary | ICD-10-CM

## 2015-12-16 DIAGNOSIS — E1065 Type 1 diabetes mellitus with hyperglycemia: Principal | ICD-10-CM

## 2015-12-16 MED ORDER — INSULIN ASPART 100 UNIT/ML ~~LOC~~ SOLN: SUBCUTANEOUS | Status: DC

## 2015-12-30 ENCOUNTER — Ambulatory Visit (INDEPENDENT_AMBULATORY_CARE_PROVIDER_SITE_OTHER): Payer: BC Managed Care – PPO | Admitting: "Endocrinology

## 2015-12-30 ENCOUNTER — Encounter: Payer: Self-pay | Admitting: "Endocrinology

## 2015-12-30 VITALS — BP 136/85 | HR 118 | Wt 203.0 lb

## 2015-12-30 DIAGNOSIS — E1042 Type 1 diabetes mellitus with diabetic polyneuropathy: Secondary | ICD-10-CM | POA: Diagnosis not present

## 2015-12-30 DIAGNOSIS — E1043 Type 1 diabetes mellitus with diabetic autonomic (poly)neuropathy: Secondary | ICD-10-CM

## 2015-12-30 DIAGNOSIS — E063 Autoimmune thyroiditis: Secondary | ICD-10-CM

## 2015-12-30 DIAGNOSIS — E049 Nontoxic goiter, unspecified: Secondary | ICD-10-CM

## 2015-12-30 DIAGNOSIS — E1065 Type 1 diabetes mellitus with hyperglycemia: Principal | ICD-10-CM

## 2015-12-30 DIAGNOSIS — E10649 Type 1 diabetes mellitus with hypoglycemia without coma: Secondary | ICD-10-CM | POA: Diagnosis not present

## 2015-12-30 DIAGNOSIS — E785 Hyperlipidemia, unspecified: Secondary | ICD-10-CM

## 2015-12-30 DIAGNOSIS — E109 Type 1 diabetes mellitus without complications: Secondary | ICD-10-CM

## 2015-12-30 DIAGNOSIS — IMO0001 Reserved for inherently not codable concepts without codable children: Secondary | ICD-10-CM

## 2015-12-30 DIAGNOSIS — K3184 Gastroparesis: Secondary | ICD-10-CM

## 2015-12-30 DIAGNOSIS — R000 Tachycardia, unspecified: Secondary | ICD-10-CM

## 2015-12-30 DIAGNOSIS — F4320 Adjustment disorder, unspecified: Secondary | ICD-10-CM

## 2015-12-30 LAB — POCT GLYCOSYLATED HEMOGLOBIN (HGB A1C): HEMOGLOBIN A1C: 8.1

## 2015-12-30 LAB — GLUCOSE, POCT (MANUAL RESULT ENTRY): POC Glucose: 158 mg/dl — AB (ref 70–99)

## 2015-12-30 NOTE — Progress Notes (Signed)
CC: FU T1DM, hypoglycemia, hypertension, hyperlipidemia, autonomic neuropathy, tachycardia, gastroparesis, peripheral neuropathy, thyroiditis, goiter, hepatitis B, fatigue  HPI: Samuel Valentine is a 45 y.o. Caucasian gentleman. He was unaccompanied.   1. [facility] was diagnosed with T2DM about 2002-2003. He was treated with metformin, Avandia, and Amaryl, but BGs were not well controlled, so Lantus was begun in 2005. When it became apparent that he would need a multiple daily injection regimen with Lantus and Novolog, he was referred to me by [name], RN, CDE, of the [facility]. I saw him for the first time on 02/12/05. He had just been started on Novolog aspart insulin at meals (120/25/10 plan). His PMH included recurrent hypoglycemia, hypertension, dyslipidemia, and proteinuria. He had had oral surgery in the past, but no other surgery. His psychiatric diagnoses included depression, anxiety, and dissociative identity disorder. He was also allergic to penicillin.  Social history included the fact that he was gay. All HIV tests had been negative through that time.  He had recently been laid off and had no health insurance. In March 2006, his HbA1c had been 10.6%. On exam he had a goiter and mild peripheral neuropathy. 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 We accepted him as a charity patient and enrolled him in our Diabetes Survival Skills Program. He was converted to a Medtronic 508 insulin pump in August 2006.   2. During the next ten  years his DM self-care efforts and HbA1c values waxed and waned, with A1c values ranging from 6.4%-9.2%. His worst BGs occurred when he was being treated with interferon for hepatitis B, from which he is now in remission. He has been gainfully employed at [employer], [employer], and again at [employer] for several years and has had health insurance. In 2011 we changed him to a Medtronic 723 (Revel) pump and  started him on the Medtronic CGM sensor. He is now on a Medtronic 530G insulin pump. Since the introduction of CGM-pump therapy, his BGs have significantly improved when he has been consistent with his T1DM self-care.    3. His last [facility] visit was on 10/01/15. His HbA1c then was 7.8%. In the interim he has had some nasal and sinus congestion, possibly due to allergies. He may be moving soon to [place]. He has been changing sites about every 4 days. He uses Novolog aspart in his Medtronic insulin pump. He wears the Medtronic Enlite sensor most of the time. He has been taking enalapril, 10 mg once daily. He also takes 10 mg of atorvastatin once daily, Centrum Silver, and two 81 mg ASA per day. He no longer takes [place]. He is also on Adderal for adult ADD. His work situation and marriage are doing well. He has not been as compliant with exercise and with eating right. At his last visit at [facility] on 11/24/15 most of his liver tests were good, but one was questionable. He will repeat several blood tests of his liver status in the near future.   4. Pertinent Review of Systems: Constitutional: The patient feels "decent, but exhausted". "I'm often stressed, but not as bad."  He has few physical complaints. Eyes: Vision is pretty good. His last eye exam was in August 2015. He needs to schedule a follow up exam at [facility].  Neck:  His anterior neck has not been tender. Heart: Heart rate increases with exercise or other physical activity. He has not had any chest wall  pains. The patient has no other complaints of irregular heat beats, chest pain, or chest pressure. Gastrointestinal: His reflux has not often been an issue for him. He does have occasional upset stomach. He has constipation rarely if he doesn't drink enough fluid. He does have occasional abdominal bloating, usually after eating a lot of fresh vegetables. Hands: His hand rash resolved after using Lamasil. He still uses  Lamisil 1-2 times per week.  Legs: Muscle mass and strength seem normal. He has occasional tingling. There are no complaints of numbness, burning, or pain. No edema is noted.  Feet:  He continues to apply Lamasil cream as a preventive measure about twice a week  His feet are often hot, but no longer itch and tingle. There are no complaints of numbness, burning, or pain. No edema is noted. Hypoglycemia: A few, but not as much. He tends to let his BGs run a "little bit high" when he is traveling.   5 . BG printout: He changes sites every 4-5  days. He checks BGs 2-7 times per day. The longer his sites remain in, the worse the BGs tend to be. His results are variable based upon the number of BG checks he does, how often he uses his sensor, the intensity and duration of exercise, whether or not he is sick, and the tendency of his sites to go bad with prolonged use. Some sites do work better than others. He boluses from 4-9 times per day. He still often gives correction boluses based upon his CGM value rather than checking BGs. He also sometimes just gives manual boluses. His average BG is 234, compared with 237 at last visit.   6. CGM sensor readout: Overall his sensor values and BG values correlate quite well. The sensor glucose values vary substantially hour to hour and day to day. He has had 4 "Threshold suspends" in the past two weeks, two at breakfast, one at diner, and one in the later evening. I do not see any distinct patter. Instead, his sensor values reflect his highly variable meal schedule, carb intakes, activity levels, and stress levels. His tightest "shot group" of the day is about 7:30 AM. His areas of widest BG variation is between 1-6 PM and 9-12 PM.  PAST MEDICAL, FAMILY, AND SOCIAL HISTORY:  1. 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 He and his partner live in [place]. They were married in the [address] [place]. They may be moving to [facility] soon. 2. He has not been  walking or working out much.    3. Primary care provider: Dr. Sharlot Gowda  REVIEW OF SYSTEMS: [facility] does not have any significant problems involving his other body systems.  PHYSICAL EXAM: BP 136/85 mmHg  Pulse 118  Wt 203 lb (92.08 kg)  He has lost 3 pounds since his last visit.  Constitutional: The patient looks healthy and quite good overall. He appears physically and emotionally well. His affect and engagement are normal.  Eyes: There is no arcus or proptosis.  Mouth: The oropharynx appears normal. The tongue appears normal. There is normal oral moisture. There is no obvious gingivitis. Neck: There are no bruits present. The thyroid gland appears slightly enlarged. The thyroid gland is slightly larger at 23 grams in size. Both lobes are enlarged, the left slightly larger. The consistency of the thyroid gland is normal. Both lobes are mildly tender to palpation today.   Lungs: The lungs are clear. Air movement is good. Heart:  The heart rhythm and rate appear normal. Heart sounds S1 and S2 are normal. I do not appreciate any pathologic heart murmurs. Abdomen: The abdomen is enlarged, but smaller. Bowel sounds are normal. The abdomen is soft and non-tender. There is no obviously palpable hepatomegaly, splenomegaly, or other masses.  Arms: Muscle mass appears appropriate for age.  Hands: There is no obvious tremor. Phalangeal and metacarpophalangeal joints appear normal. His rash has resolved.  Legs: Muscle mass appears appropriate for age. There is no edema.  Feet: There are no significant deformities. Dorsalis pedis pulses are 2+ bilaterally. His feet look good. He has no obvious tinea pedis of the feet today.  Neurologic: Muscle strength is normal for age and gender in both the upper and the lower extremities. Muscle tone appears normal. Sensation to touch is normal in the legs, but slightly decreased in the right heel today.    LABS:   Labs 12/30/15: HbA1c 8.1%  Labs 10/01/15: HbA1c  7.8%  Labs 11/18/14; HbA1c 8.9%, compared with 7.7% at last visit and with 7.0% at the visit prior. CMP normal except for glucose of 173, normal AST and ALT; cholesterol 161, triglycerides 8, HDL 67, LDL 78; urinary microalbumin/creatinine ratio 2.5; TSH 1.357, free T4 1.04, free T3 3.0  Labs 12/06/12: CMP with normal AST and ALT. Cholesterol 136, triglycerides 74, HDL 47 (decreased from 58 when he was exercising), LDL 74; microalbumin/creatinine ratio 2.3; TSH 1.782, free T4 0.98, free T3 3.1  Labs 02/03/12: CMP: Normal; TSH 1.565, free T4 0.98, free T3 3.0; cholesterol 156, triglycerides 125, HDL 58, LDL 73; Urinary microalbumin/creatinine ratio 4.2.  ASSESSMENT:  1. T1DM: His BG control is worse, both in terms of a higher HbA1c and in terms of having more BGs >400. He has actually been doing a pretty good job of wearing his sensor, checking BGs, and taking boluses, but he could do better. It is clear that when his sites stay in for more than three days, BGs tend to rise. He does need to be more consistent with his diet and his exercise regimen. It is clear that the more he checks BGs and take both correction boluses and food boluses, the better his BGs have been.  2. Hypoglycemia: He is having more low BGs. His significant low BGs often trigger the Threshold Suspend function, so his BGs do not usually drop too low.    3. Thyroiditis: Clinically active today, c/w ongoing thyroiditis.. 4. Goiter: The thyroid gland has increased a bit in size since his last visit. The waxing and waning of thyroid gland size is c/w evolving Hashimoto's disease.  He was euthyroid in March 2014 and in February 2016.  He needs to get his lab tests done as I requested at his last visit.  5. Hypertension: BP is higher. He needs to continue to exercise and to lose weight.  6. Hyperlipidemia: Lipid panel results in March 2014 and again in February 2016 were good overall. He needs to repeat his lipid panel soon. 7. Autonomic  neuropathy, tachycardia, and gastroparesis: With the worsening of BG control he has had, his neuropathy, tachycardia, and gastroparesis have all worsened.   8. Peripheral neuropathy: His neuropathy is very mild today, but worse than at last visit.  9. Adjustment reaction: He is doing very fairly well today.   PLAN:  1. Diagnostic: HbA1c today. Fasting annual surveillance labs in the next two weeks.    2. Therapeutic:   A. Take better care of your T1DM. Try to change  pump sites every three days. Use Lamisil or other anti-fungal agent for tinea pedis. Continue enalapril and atorvastatin. Eat right. Exercise right. Don't try too hard to avoid hypoglycemia. Be prepared to reduce basal rates and/or to use temporary basal rates when exercising or eating late. Please subtract 50-100-150 points of BG after exercise.  B. Continue current bolus settings. Continue current basal rates: MN: 0.850 3 AM: 1.30 4:30 AM: 1.40 8 AM: 1.15 12:00 noon: 0.850 7 PM: 1.10 3. Patient education: We discussed life style changes at length. He needs to stay on track consistently.   4. Follow up: FU appointment in 3 months.  Level of Service: This visit lasted in excess of 45 minutes. More than 50% of the visit was devoted to counseling.  David Stall

## 2015-12-30 NOTE — Patient Instructions (Signed)
Follow up visit in 3 months. Please have lab tests done in the next two weeks.

## 2016-03-01 ENCOUNTER — Telehealth: Payer: Self-pay | Admitting: Family Medicine

## 2016-03-01 DIAGNOSIS — F902 Attention-deficit hyperactivity disorder, combined type: Secondary | ICD-10-CM

## 2016-03-01 NOTE — Telephone Encounter (Signed)
Pt needs refills for Adderall, please call when ready, only has a few left.

## 2016-03-02 MED ORDER — AMPHETAMINE-DEXTROAMPHETAMINE 20 MG PO TABS: 20.0000 mg | ORAL_TABLET | Freq: Two times a day (BID) | ORAL | Status: DC

## 2016-03-03 ENCOUNTER — Telehealth: Payer: Self-pay

## 2016-03-03 NOTE — Telephone Encounter (Signed)
PT notified adderall prescriptions at front desk for pick up.

## 2016-03-11 ENCOUNTER — Other Ambulatory Visit: Payer: Self-pay | Admitting: "Endocrinology

## 2016-03-11 LAB — HM DIABETES EYE EXAM

## 2016-03-22 ENCOUNTER — Encounter: Payer: Self-pay | Admitting: "Endocrinology

## 2016-03-30 ENCOUNTER — Ambulatory Visit: Payer: BC Managed Care – PPO | Admitting: "Endocrinology

## 2016-03-31 ENCOUNTER — Ambulatory Visit (INDEPENDENT_AMBULATORY_CARE_PROVIDER_SITE_OTHER): Payer: BC Managed Care – PPO | Admitting: "Endocrinology

## 2016-03-31 ENCOUNTER — Encounter: Payer: Self-pay | Admitting: "Endocrinology

## 2016-03-31 VITALS — BP 131/87 | HR 91 | Wt 207.0 lb

## 2016-03-31 DIAGNOSIS — E1043 Type 1 diabetes mellitus with diabetic autonomic (poly)neuropathy: Secondary | ICD-10-CM | POA: Diagnosis not present

## 2016-03-31 DIAGNOSIS — K3184 Gastroparesis: Secondary | ICD-10-CM

## 2016-03-31 DIAGNOSIS — E049 Nontoxic goiter, unspecified: Secondary | ICD-10-CM

## 2016-03-31 DIAGNOSIS — E1065 Type 1 diabetes mellitus with hyperglycemia: Principal | ICD-10-CM

## 2016-03-31 DIAGNOSIS — E10649 Type 1 diabetes mellitus with hypoglycemia without coma: Secondary | ICD-10-CM | POA: Diagnosis not present

## 2016-03-31 DIAGNOSIS — R000 Tachycardia, unspecified: Secondary | ICD-10-CM | POA: Diagnosis not present

## 2016-03-31 DIAGNOSIS — I4711 Inappropriate sinus tachycardia, so stated: Secondary | ICD-10-CM

## 2016-03-31 DIAGNOSIS — E1042 Type 1 diabetes mellitus with diabetic polyneuropathy: Secondary | ICD-10-CM

## 2016-03-31 DIAGNOSIS — E1069 Type 1 diabetes mellitus with other specified complication: Secondary | ICD-10-CM

## 2016-03-31 DIAGNOSIS — E109 Type 1 diabetes mellitus without complications: Secondary | ICD-10-CM | POA: Diagnosis not present

## 2016-03-31 DIAGNOSIS — IMO0001 Reserved for inherently not codable concepts without codable children: Secondary | ICD-10-CM

## 2016-03-31 DIAGNOSIS — E785 Hyperlipidemia, unspecified: Secondary | ICD-10-CM

## 2016-03-31 DIAGNOSIS — E063 Autoimmune thyroiditis: Secondary | ICD-10-CM

## 2016-03-31 DIAGNOSIS — I10 Essential (primary) hypertension: Secondary | ICD-10-CM

## 2016-03-31 LAB — COMPREHENSIVE METABOLIC PANEL
ALT: 21 U/L (ref 9–46)
AST: 20 U/L (ref 10–40)
Albumin: 4.4 g/dL (ref 3.6–5.1)
Alkaline Phosphatase: 70 U/L (ref 40–115)
BILIRUBIN TOTAL: 0.7 mg/dL (ref 0.2–1.2)
BUN: 9 mg/dL (ref 7–25)
CALCIUM: 9.3 mg/dL (ref 8.6–10.3)
CHLORIDE: 101 mmol/L (ref 98–110)
CO2: 29 mmol/L (ref 20–31)
Creat: 1.1 mg/dL (ref 0.60–1.35)
GLUCOSE: 173 mg/dL — AB (ref 70–99)
Potassium: 5 mmol/L (ref 3.5–5.3)
Sodium: 138 mmol/L (ref 135–146)
Total Protein: 6.8 g/dL (ref 6.1–8.1)

## 2016-03-31 LAB — LIPID PANEL
CHOL/HDL RATIO: 1.9 ratio (ref <=5.0)
CHOLESTEROL: 162 mg/dL (ref 125–200)
HDL: 86 mg/dL (ref >=40)
LDL Cholesterol: 66 mg/dL (ref <130)
Triglycerides: 49 mg/dL (ref <150)
VLDL: 10 mg/dL (ref <30)

## 2016-03-31 LAB — POCT GLYCOSYLATED HEMOGLOBIN (HGB A1C): HEMOGLOBIN A1C: 8.4

## 2016-03-31 LAB — GLUCOSE, POCT (MANUAL RESULT ENTRY): POC Glucose: 160 mg/dL — AB (ref 70–99)

## 2016-03-31 NOTE — Progress Notes (Signed)
CC: FU T1DM, hypoglycemia, hypertension, hyperlipidemia, autonomic neuropathy, tachycardia, gastroparesis, peripheral neuropathy, thyroiditis, goiter, hepatitis B, fatigue  HPI: Mr. Carrie MewCollis is a 45 y.o. Caucasian gentleman. He was unaccompanied.   1. Mr. Carrie MewCollis was diagnosed with T2DM about 2002-2003. He was treated with metformin, Avandia, and Amaryl, but BGs were not well controlled, so Lantus was begun in 2005. When it became apparent that he would need a multiple daily injection regimen with Lantus and Novolog, he was referred to me by Ms. Lenor CoffinAnn Clark, RN, CDE, of the [facility]. I saw him for the first time on 02/12/05. He had just been started on Novolog aspart insulin at meals (120/25/10 plan). His PMH included recurrent hypoglycemia, hypertension, dyslipidemia, and proteinuria. He had had oral surgery in the past, but no other surgery. His psychiatric diagnoses included depression, anxiety, and dissociative identity disorder. He was also allergic to penicillin.  Social history included the fact that he was gay. All HIV tests had been negative through that time.  He had recently been laid off and had no health insurance. In March 2006, his HbA1c had been 10.6%. On exam he had a goiter and mild peripheral neuropathy. 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 We accepted him as a charity patient and enrolled him in our Diabetes Survival Skills Program. He was converted to a Medtronic 508 insulin pump in August 2006.   2. During the next ten  years his DM self-care efforts and HbA1c values waxed and waned, with A1c values ranging from 6.4%-9.2%. His worst BGs occurred when he was being treated with interferon for hepatitis B, from which he is now in remission. He has been gainfully employed at [employer], [employer], and again at [employer] for several years and has had health insurance. In 2011 we changed him to a Medtronic 723 (Revel) pump and  started him on the Medtronic CGM sensor. He is now on a Medtronic 530G insulin pump. Since the introduction of CGM-pump therapy, his BGs have significantly improved when he has been consistent with his T1DM self-care.    3. His last [facility] visit was on 12/30/15. His HbA1c then was 8.7%. In the interim he has been healthy, but has had some nasal and sinus congestion problems due to his allergies. He decided not to move to [facility]. He has been changing sites about every 5 days. He uses Novolog aspart in his Medtronic insulin pump. He wears the Medtronic Enlite sensor much of the time. He has been taking enalapril, 10 mg once daily. He also takes 10 mg of atorvastatin once daily, Centrum Silver, and two 81 mg ASA per day. He no longer takes EndwellBaraclude. He is also on Adderal for adult ADD. His work situation and marriage are doing well. He has been doing a lot of walking. He has also ben traveling for work more often. At his [facility] on 03/01/16 his liver tests were good. He will have a follow up visit in 6 months.  4. Pertinent Review of Systems: Constitutional: The patient feels "much better". He can feel his higher BGs more sensitively now. He has few physical complaints. Eyes: Vision is pretty good. His last eye exam was in June 2017. There were no signs of diabetic eye disease. He has presbyopia, but no other problems.  Neck:  He occasionally notes some discomfort in his anterior neck, more frequently over the left lobe than over the right lobe. Heart:  He occasionally notes some faster heart rate when he is drinking more caffeine. Heart rate increases with exercise or other physical activity. He has not had any chest wall pains. He has no other complaints of irregular heat beats, chest pain, or chest pressure. Gastrointestinal: His reflux has not often been an issue for him recently. He does have occasional upset stomach. He has constipation rarely if he doesn't drink enough fluid.  He does have occasional abdominal bloating, usually after eating a lot of fresh vegetables. Hands: His hand rash resolved after using Lamisil. He still uses Lamisil 1-2 times per week as a preventive measure.  Legs: Muscle mass and strength seem normal. He occasionally note some "tightening: of his leg muscles, especially when he is on a plane. There are no complaints of numbness, tingling, burning, or pain. No edema is noted.  Feet:  He continues to apply Lamisil cream as a preventive measure about twice a week  His feet are often hot, but no longer itch and tingle. There are no complaints of numbness, burning, or pain. No edema is noted. Hypoglycemia: He has had "some low BGs". He tends to let his BGs run a "little bit high" when he is traveling.   5 . BG printout: He changes sites every 5  days. He checks BGs 3-6 times per day. The longer his sites remain in, the higher the BGs tend to be. His BG results vary based upon the number of BG checks he does, how often he uses his sensor, the intensity and duration of exercise, whether or not he is sick, and the tendency of his sites to go bad with prolonged use. Some sites work better than others. He boluses from 4-9 times per day. He still often gives correction boluses based upon his CGM value rather than checking BGs. He also sometimes just gives manual boluses. His average BG is 225, compared with 234 at last visit and with 237 at the prior visit.   6. CGM sensor readout: Overall his sensor values and BG values correlate quite well. The sensor glucose values vary substantially hour to hour and day to day. He has had 3 "Threshold suspends" in the past four weeks, one in the late morning when he slept in late, one at lunch, and one at dinner. I do not see any distinct pattern. Instead, his sensor values reflect his highly variable meal schedule, carb intakes, activity levels, and stress levels. He has more low BGs between 5-11 AM and occasionally between 2  PM-7 PM.   PAST MEDICAL, FAMILY, AND SOCIAL HISTORY:  1. 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 He and his partner live in [place]. They were married in the [address] of [place].  2. He has been walking more.     3. Primary care provider: Dr. Sharlot Gowda  REVIEW OF SYSTEMS: Mr. Hedding does not have any significant problems involving his other body systems.  PHYSICAL EXAM: BP 131/87 mmHg  Pulse 91  Wt 207 lb (93.895 kg)  Francois  has gained 4 pounds since his last visit.  Constitutional: The patient looks healthy and quite good overall. He appears physically and emotionally well. His affect and engagement are normal.  Eyes: There is no arcus or proptosis.  Mouth: The oropharynx appears normal. The tongue appears normal. There is normal oral moisture. There is no obvious gingivitis. Neck: There are no bruits present. The thyroid gland appears slightly enlarged. The thyroid gland is slightly smaller at  23 grams in size. Both lobes are enlarged, the left slightly larger. The consistency of the thyroid gland is normal. Both lobes are mildly tender to palpation today, left more than right..   Lungs: The lungs are clear. Air movement is good. Heart: The heart rhythm and rate appear normal. Heart sounds S1 and S2 are normal. I do not appreciate any pathologic heart murmurs. Abdomen: The abdomen is enlarged, but smaller. Bowel sounds are normal. The abdomen is soft and non-tender. There is no obviously palpable hepatomegaly, splenomegaly, or other masses.  Arms: Muscle mass appears appropriate for age.  Hands: There is no obvious tremor. Phalangeal and metacarpophalangeal joints appear normal. His rash has resolved.  Legs: Muscle mass appears appropriate for age. There is no edema.  Feet: There are no significant deformities. Dorsalis pedis pulses are 2+ bilaterally. His feet look good. He has no obvious tinea pedis of the feet today.  Neurologic: Muscle strength is normal for age and  gender in both the upper and the lower extremities. Muscle tone appears normal. Sensation to touch is normal in the legs, but slightly decreased in the left heel today.    LABS:   Labs 03/31/16: HbA1c 8.4%  Labs 12/30/15: HbA1c 8.1%  Labs 10/01/15: HbA1c 7.8%  Labs 11/18/14; HbA1c 8.9%, compared with 7.7% at last visit and with 7.0% at the visit prior. CMP normal except for glucose of 173, normal AST and ALT; cholesterol 161, triglycerides 8, HDL 67, LDL 78; urinary microalbumin/creatinine ratio 2.5; TSH 1.357, free T4 1.04, free T3 3.0  Labs 12/06/12: CMP with normal AST and ALT. Cholesterol 136, triglycerides 74, HDL 47 (decreased from 58 when he was exercising), LDL 74; microalbumin/creatinine ratio 2.3; TSH 1.782, free T4 0.98, free T3 3.1  Labs 02/03/12: CMP: Normal; TSH 1.565, free T4 0.98, free T3 3.0; cholesterol 156, triglycerides 125, HDL 58, LDL 73; Urinary microalbumin/creatinine ratio 4.2.  ASSESSMENT:  1. T1DM: His BG control is worse, both in terms of a higher HbA1c and in terms of having more BGs >200. Although he is not changing his sites often enough, he has actually been doing a pretty good job of wearing his sensor, checking BGs, and taking boluses. It is clear that when his sites stay in for more than three days, BGs tend to rise. He does need to be more consistent with his diet and his exercise regimen. It is clear that the more he checks BGs and take both correction boluses and food boluses, the better his BGs have been.  2. Hypoglycemia: He is having fewer low BGs. His significant low BGs trigger the Threshold Suspend function, so his BGs do not usually drop too low.    3. Thyroiditis: His thyroiditis is clinically active today, c/w ongoing Hashimoto's Dz. 4. Goiter: The thyroid gland has decreased a bit in size since his last visit. The waxing and waning of thyroid gland size is c/w evolving Hashimoto's disease.  He was euthyroid in March 2014 and in February 2016.  He needs to  get his lab tests done as I requested at his last visit.  5. Hypertension: DBP is higher. He needs to continue to exercise and to lose weight.  6. Hyperlipidemia: Lipid panel results in March 2014 and again in February 2016 were good overall. He needs to repeat his lipid panel soon. 7. Autonomic neuropathy, tachycardia, and gastroparesis: With the worsening of BG control he has had, his neuropathy, tachycardia, and gastroparesis worsened in parallel.   8. Peripheral neuropathy: His  neuropathy is very mild today.  9. Adjustment reaction: He is doing very fairly well today.   PLAN:  1. Diagnostic: HbA1c today. Fasting annual surveillance labs now.     2. Therapeutic:   A. Take better care of your T1DM. Try to change pump sites every three days. Use Lamisil or other anti-fungal agent for tinea pedis. Continue enalapril and atorvastatin. Eat right. Exercise right. Don't try too hard to avoid hypoglycemia. Be prepared to reduce basal rates and/or to use temporary basal rates when exercising or eating late. Please subtract 50-100-150 points of BG after exercise.  B. Continue current bolus settings. Continue current basal rates: MN: 0.850 3 AM: 1.30 4:30 AM: 1.40 8 AM: 1.15 12:00 noon: 0.850 7 PM: 1.10 3. Patient education: We discussed life style changes at length. He needs to stay on track consistently.   4. Follow up: FU appointment in 3 months.  Level of Service: This visit lasted in excess of 45 minutes. More than 50% of the visit was devoted to counseling.  David Stall

## 2016-03-31 NOTE — Patient Instructions (Signed)
Follow up visit in 3 months. 

## 2016-04-01 LAB — TSH: TSH: 1.07 mIU/L (ref 0.40–4.50)

## 2016-04-01 LAB — MICROALBUMIN / CREATININE URINE RATIO
Creatinine, Urine: 217 mg/dL (ref 20–370)
MICROALB UR: 0.2 mg/dL
Microalb Creat Ratio: 1 mcg/mg creat (ref <30)

## 2016-04-01 LAB — T3, FREE: T3, Free: 3.4 pg/mL (ref 2.3–4.2)

## 2016-04-01 LAB — T4, FREE: FREE T4: 1.1 ng/dL (ref 0.8–1.8)

## 2016-04-09 ENCOUNTER — Encounter (INDEPENDENT_AMBULATORY_CARE_PROVIDER_SITE_OTHER): Payer: BC Managed Care – PPO | Admitting: *Deleted

## 2016-04-09 VITALS — BP 115/78 | HR 88 | Temp 97.9°F | Ht 73.0 in | Wt 209.5 lb

## 2016-04-09 DIAGNOSIS — Z006 Encounter for examination for normal comparison and control in clinical research program: Secondary | ICD-10-CM

## 2016-04-09 LAB — COMPREHENSIVE METABOLIC PANEL
ALBUMIN: 4.4 g/dL (ref 3.6–5.1)
ALK PHOS: 78 U/L (ref 40–115)
ALT: 33 U/L (ref 9–46)
AST: 25 U/L (ref 10–40)
BUN: 10 mg/dL (ref 7–25)
CALCIUM: 9.4 mg/dL (ref 8.6–10.3)
CO2: 27 mmol/L (ref 20–31)
Chloride: 98 mmol/L (ref 98–110)
Creat: 1.01 mg/dL (ref 0.60–1.35)
Glucose, Bld: 198 mg/dL — ABNORMAL HIGH (ref 65–99)
POTASSIUM: 4.6 mmol/L (ref 3.5–5.3)
Sodium: 137 mmol/L (ref 135–146)
TOTAL PROTEIN: 7.1 g/dL (ref 6.1–8.1)
Total Bilirubin: 0.6 mg/dL (ref 0.2–1.2)

## 2016-04-09 LAB — CBC WITH DIFFERENTIAL/PLATELET
BASOS PCT: 0 %
Basophils Absolute: 0 cells/uL (ref 0–200)
EOS ABS: 60 {cells}/uL (ref 15–500)
Eosinophils Relative: 1 %
HEMATOCRIT: 45.1 % (ref 38.5–50.0)
Hemoglobin: 15.1 g/dL (ref 13.2–17.1)
Lymphocytes Relative: 47 %
Lymphs Abs: 2820 cells/uL (ref 850–3900)
MCH: 30 pg (ref 27.0–33.0)
MCHC: 33.5 g/dL (ref 32.0–36.0)
MCV: 89.7 fL (ref 80.0–100.0)
MONO ABS: 480 {cells}/uL (ref 200–950)
MPV: 9.8 fL (ref 7.5–12.5)
Monocytes Relative: 8 %
NEUTROS ABS: 2640 {cells}/uL (ref 1500–7800)
Neutrophils Relative %: 44 %
Platelets: 239 10*3/uL (ref 140–400)
RBC: 5.03 MIL/uL (ref 4.20–5.80)
RDW: 14.6 % (ref 11.0–15.0)
WBC: 6 10*3/uL (ref 3.8–10.8)

## 2016-04-10 LAB — HIV ANTIBODY (ROUTINE TESTING W REFLEX): HIV: NONREACTIVE

## 2016-04-10 LAB — HEPATITIS C ANTIBODY: HCV Ab: NEGATIVE

## 2016-04-10 LAB — HEPATITIS B SURFACE ANTIGEN: Hepatitis B Surface Ag: NEGATIVE

## 2016-04-10 NOTE — Progress Notes (Signed)
Patient ID: Terrance Sotomayor, male   DOB: 12-26-1970, 45 y.o.   MRN: [id number]  Study: A Phase 2b/3 Double Blind Safety and Efficacy Study of Injectable Cabotegravir compared to Daily Oral Tenofovir Disoproxil Fumarate/Emtricitabine (TDF/FTC), For Pre-Exposure Prophylaxis in HIV-Uninfected Cisgender Men and Transgender Women who have sex with Men.  Medication: Investigational Injectable Cabotegravir/placebo compared to Truvada/placebo. Duration: Around 4 years.  Calab is here for [id number] screening visit. We determined his eligibility to screen based on the fact that he is married to a man and their relationship is open to having sex with other couples together or separate. He has had sex with 6 men in the past 6 months and is mainly receptive. He also occassionally inhales nitrates/poppers - last use was within 3 months ago. After verifying the correct version I explained/reviewed the informed consent in the language that he understood. Risk, benefits, responsibilities, and other options were reviewed. I answered his questions. Comprehension was assessed. He was given adequate time to consider his options. He verbalized understanding and signed the consent witnessed by me. I then gave him a copy of the consent. HIV counseling was given including description of the testing and how it is done; explained HIV and how it is spread and ways to prevent it; Discussed the meaning of the possible test results and what impact the test results may have on the participant. PTID assigned. Confirmation rapid HIV test was negative. Medical history, medications, bleeding history, and signs/symptoms were reviewed. ECG, HIV VL, and Complete PE will be performed next week. He received $50 gift card for screening visit. Tacey Heap RN

## 2016-04-12 ENCOUNTER — Encounter (INDEPENDENT_AMBULATORY_CARE_PROVIDER_SITE_OTHER): Payer: BC Managed Care – PPO | Admitting: Infectious Disease

## 2016-04-12 VITALS — BP 128/86 | HR 86 | Temp 98.3°F

## 2016-04-12 DIAGNOSIS — Z006 Encounter for examination for normal comparison and control in clinical research program: Secondary | ICD-10-CM

## 2016-04-12 DIAGNOSIS — E1042 Type 1 diabetes mellitus with diabetic polyneuropathy: Secondary | ICD-10-CM

## 2016-04-12 NOTE — Progress Notes (Signed)
Chief complaint: CPE for HPTN 083  Subjective:    Patient ID: Samuel Valentine, male    DOB: [birth year], 45 y.o.   MRN: [id number]  HPI  45 year old Caucasian man who has sex with men including his HIV + (but well controlled partner) who is of sufficient risk for contracting HIV that he would be eligible for HPTN 083.  His PMHX is significant for IDDM with onset in his 34's, Hepatitis B without delta agent or cirrhosis sp nearly 6 years of entecavir with loss of Hepatitis B surface antigen and undetectable HBV DNA even several years after stopping therapy. He was followed by [facility] who felt he was likely cured of his HBV infection. He is followed by Dr. [facility] and Dr. Tobe Sos for his IDDM.  Past Medical History:  Diagnosis Date  . Chronic hepatitis B ([place])    followed at [facility]  . Hypercholesterolemia   . Type 1 diabetes ([place])    Dr. Tobe Sos    No past surgical history on file.  No family history on file.    Social History   Social History  . Marital status: Single    Spouse name: N/A  . Number of children: N/A  . Years of education: N/A   Occupational History  . data analyst [employer]   Social History Main Topics  . Smoking status: Never Smoker  . Smokeless tobacco: Never Used  . Alcohol use Yes     Comment: maybe one drink per month  . Drug use: No  . Sexual activity: Yes    Partners: Male   Other Topics Concern  . Not on file   Social History Narrative   Same sex marriage, 2 cats    Allergies  Allergen Reactions  . Penicillins      Current Outpatient Prescriptions:  .  amphetamine-dextroamphetamine (ADDERALL) 20 MG tablet, Take 1 tablet (20 mg total) by mouth 2 (two) times daily., Disp: 60 tablet, Rfl: 0 .  aspirin 81 MG chewable tablet, Chew 81 mg by mouth 2 (two) times daily. , Disp: , Rfl:  .  atorvastatin (LIPITOR) 10 MG tablet, TAKE 1 TABLET BY MOUTH EVERY DAY, Disp: 30 tablet, Rfl: 0 .  BAYER CONTOUR NEXT TEST test  strip, USE TO TEST 6 TO 8 TIMES DAILY AS DIRECTED, Disp: 250 each, Rfl: 6 .  enalapril (VASOTEC) 5 MG tablet, TAKE 2 TABLETS BY MOUTH TWICE DAILY, Disp: 360 tablet, Rfl: 0 .  glucagon (GLUCAGON EMERGENCY) 1 MG injection, Inject 1 mg into the vein once as needed., Disp: 2 kit, Rfl: 2 .  insulin aspart (NOVOLOG) 100 UNIT/ML injection, Use 300 units in insulin pump every 48 hours, Disp: 5 vial, Rfl: 5 .  Multiple Vitamin (MULTIVITAMIN) tablet, Take 1 tablet by mouth daily.  , Disp: , Rfl:  .  NOVOLOG PENFILL cartridge, USE IN INSULIN PUMP AS DIRECTED( 300 UNITS EVERY 2 TO 3 DAYS), Disp: 120 mL, Rfl: 0 .  valACYclovir (VALTREX) 1000 MG tablet, 2 pills twice a day at the first sign of cold sore, Disp: 12 tablet, Rfl: 5    Review of Systems  Constitutional: Negative for activity change, appetite change, chills, diaphoresis, fatigue, fever and unexpected weight change.  HENT: Negative for congestion, rhinorrhea, sinus pressure, sneezing, sore throat and trouble swallowing.   Eyes: Negative for photophobia and visual disturbance.  Respiratory: Negative for cough, chest tightness, shortness of breath, wheezing and stridor.   Cardiovascular: Negative for chest pain, palpitations and  leg swelling.  Gastrointestinal: Negative for abdominal distention, abdominal pain, anal bleeding, blood in stool, constipation, diarrhea, nausea and vomiting.  Genitourinary: Negative for difficulty urinating, dysuria, flank pain and hematuria.  Musculoskeletal: Negative for arthralgias, back pain, gait problem, joint swelling and myalgias.  Skin: Negative for color change, pallor, rash and wound.  Neurological: Negative for dizziness, tremors, weakness and light-headedness.  Hematological: Negative for adenopathy. Does not bruise/bleed easily.  Psychiatric/Behavioral: Negative for agitation, behavioral problems, confusion, decreased concentration, dysphoric mood and sleep disturbance.       Objective:   Physical Exam    Constitutional: He is oriented to person, place, and time. He appears well-developed and well-nourished.  HENT:  Head: Normocephalic and atraumatic.  Mouth/Throat: Oropharynx is clear and moist. No oropharyngeal exudate.  Eyes: Conjunctivae and EOM are normal. Pupils are equal, round, and reactive to light. Right eye exhibits no discharge. Left eye exhibits no discharge. No scleral icterus.  Neck: Normal range of motion. Neck supple. No JVD present. No tracheal deviation present. No thyromegaly present.  Cardiovascular: Normal rate, regular rhythm, normal heart sounds and intact distal pulses.  Exam reveals no gallop and no friction rub.   No murmur heard. Pulmonary/Chest: Effort normal and breath sounds normal. No respiratory distress. He has no wheezes. He has no rales.  Abdominal: Soft. Bowel sounds are normal. He exhibits no distension. There is no tenderness. There is no rebound and no guarding.  Musculoskeletal: Normal range of motion. He exhibits no edema, tenderness or deformity.  Lymphadenopathy:       Head (right side): No submental, no submandibular, no tonsillar, no preauricular, no posterior auricular and no occipital adenopathy present.       Head (left side): No submental, no submandibular, no tonsillar, no preauricular, no posterior auricular and no occipital adenopathy present.    He has no cervical adenopathy.    He has no axillary adenopathy.       Right: No supraclavicular adenopathy present.       Left: No supraclavicular adenopathy present.  Neurological: He is alert and oriented to person, place, and time. No cranial nerve deficit. Coordination normal.  Skin: Skin is warm and dry. No rash noted. No erythema. No pallor.  Psychiatric: He has a normal mood and affect. His behavior is normal. Judgment and thought content normal.    Insulin pup in place      Assessment & Plan:   Exam for entry into HPTN: His EKG with NSR with sinus bradycardia. His labs to date have  been reviewed and he would appear to be eligible for enrollment into [place]. My initial concern upon hearing of his hx of HBV infection but he has been HBV surface antigen   However I have reviewed note by [facility] and per their note after he had   "stopped entecavir 01/2015 due to loss of HBsAg x 1 year & + HBsAb in 07/2014. In  11/2015,  hepatitis B was minimally positive at 20 IUs per mL. Follow-up testing locally December 29, 2015 showed undetectable hepatitis B DNA, and [facility] felt the value of 20 was likely laboratory error. They stated they were less concerned for risk for hep B "if there is any risk for reactivation at all."  HBV surface antigen here is negative as well, as are LFT's.  He would appear ready to enroll into HPTN 083

## 2016-04-15 LAB — HIV-1 RNA QUANT-NO REFLEX-BLD
HIV 1 RNA Quant: <20 copies/mL (ref <20)
HIV-1 RNA Quant, Log: <1.3 Log copies/mL (ref <1.30)

## 2016-04-19 ENCOUNTER — Telehealth: Payer: Self-pay

## 2016-04-19 ENCOUNTER — Encounter (INDEPENDENT_AMBULATORY_CARE_PROVIDER_SITE_OTHER): Payer: BC Managed Care – PPO | Admitting: *Deleted

## 2016-04-19 VITALS — BP 119/82 | HR 76 | Temp 98.0°F | Resp 16 | Wt 212.0 lb

## 2016-04-19 DIAGNOSIS — Z006 Encounter for examination for normal comparison and control in clinical research program: Secondary | ICD-10-CM | POA: Diagnosis not present

## 2016-04-19 LAB — LIPASE: Lipase: 14 U/L (ref 7–60)

## 2016-04-19 LAB — LIPID PANEL
CHOL/HDL RATIO: 2.2 ratio (ref <=5.0)
Cholesterol: 171 mg/dL (ref 125–200)
HDL: 77 mg/dL (ref >=40)
LDL CALC: 87 mg/dL (ref <130)
Triglycerides: 36 mg/dL (ref <150)
VLDL: 7 mg/dL (ref <30)

## 2016-04-19 LAB — COMPREHENSIVE METABOLIC PANEL
ALBUMIN: 4.5 g/dL (ref 3.6–5.1)
ALT: 29 U/L (ref 9–46)
AST: 24 U/L (ref 10–40)
Alkaline Phosphatase: 74 U/L (ref 40–115)
BILIRUBIN TOTAL: 0.5 mg/dL (ref 0.2–1.2)
BUN: 10 mg/dL (ref 7–25)
CALCIUM: 9.5 mg/dL (ref 8.6–10.3)
CHLORIDE: 103 mmol/L (ref 98–110)
CO2: 30 mmol/L (ref 20–31)
CREATININE: 1 mg/dL (ref 0.60–1.35)
Glucose, Bld: 42 mg/dL — ABNORMAL LOW (ref 65–99)
Potassium: 4 mmol/L (ref 3.5–5.3)
Sodium: 142 mmol/L (ref 135–146)
TOTAL PROTEIN: 7.2 g/dL (ref 6.1–8.1)

## 2016-04-19 LAB — HIV ANTIBODY (ROUTINE TESTING W REFLEX): HIV: NONREACTIVE

## 2016-04-19 LAB — CK: Total CK: 102 U/L (ref 7–232)

## 2016-04-19 LAB — POCT UA - GLUCOSE/PROTEIN
GLUCOSE UA: NEGATIVE
Protein, UA: NEGATIVE

## 2016-04-19 LAB — PHOSPHORUS: Phosphorus: 3.5 mg/dL (ref 2.5–4.5)

## 2016-04-19 LAB — HEPATITIS B SURFACE ANTIBODY,QUALITATIVE: Hep B S Ab: POSITIVE — AB

## 2016-04-19 LAB — AMYLASE: Amylase: 200 U/L — ABNORMAL HIGH (ref 0–105)

## 2016-04-19 LAB — HEPATITIS B CORE ANTIBODY, TOTAL: Hep B Core Total Ab: REACTIVE — AB

## 2016-04-19 NOTE — Progress Notes (Signed)
Samuel Valentine is here for the entry visit for Study: A Phase 2b/3 Double Blind Safety and Efficacy Study of Injectable Cabotegravir compared to Daily Oral Tenofovir Disoproxil Fumarate/Emtricitabine (TDF/FTC), For Pre-Exposure Prophylaxis in HIV-Uninfected Cisgender Men and Transgender Women who have sex with Men.  Medication: Investigational Injectable Cabotegravir/placebo compared to Truvada/placebo. Duration: Around 4 years.  He denies any new problems or concerns except for occassional nasal drip. He was randomized after the rapid HIV test resulted as nonreactive. He will be taking truvada/placebo and cabotegravir/placebo daily. He was instructed on dosing, adherence, when to call for side effects, etc. And plans to start today. His next visit will be in 2 weeks and he was instrucvted to bring his leftover pills back with him.

## 2016-04-19 NOTE — Telephone Encounter (Signed)
Goals:  ? help study participants become as adherent as possible (100% adherence is unlikely/unrealistic) and to provide a space for them to be honest with you about their true adherence ? helping people understand the relationships between their thoughts, feelings, and behaviors and not just giving advice or telling people what to do. Instead try to be: o Nonjudgmental o Collaborative o Normalize participant difficulties This study will be conducted in three phases, or "steps."  . Step I: all participants will receive two oral study products. . Step II: participants will receive one oral product and one injectable product. . Step III: participants will either be offered 48 weeks of open label PrEP Our conversation will primarily focus on: 1. Getting to study appointments, obtaining refills of oral product, and receiving injections on time. 2. Communicating with study staff about questions and concerns. 3. Coping with side effects. 4. Formulating a daily medication schedule and reminders for oral study product. 5. Storing oral study product. 6. Handling missed doses of oral study product and missed injections.

## 2016-04-20 LAB — CBC WITH DIFFERENTIAL/PLATELET
BASOS ABS: 0 {cells}/uL (ref 0–200)
Basophils Relative: 0 %
Eosinophils Absolute: 81 cells/uL (ref 15–500)
Eosinophils Relative: 1 %
HEMATOCRIT: 44.5 % (ref 38.5–50.0)
HEMOGLOBIN: 14.8 g/dL (ref 13.2–17.1)
LYMPHS ABS: 4860 {cells}/uL — AB (ref 850–3900)
Lymphocytes Relative: 60 %
MCH: 30.5 pg (ref 27.0–33.0)
MCHC: 33.3 g/dL (ref 32.0–36.0)
MCV: 91.8 fL (ref 80.0–100.0)
MONO ABS: 567 {cells}/uL (ref 200–950)
MPV: 10 fL (ref 7.5–12.5)
Monocytes Relative: 7 %
NEUTROS ABS: 2592 {cells}/uL (ref 1500–7800)
NEUTROS PCT: 32 %
Platelets: 238 10*3/uL (ref 140–400)
RBC: 4.85 MIL/uL (ref 4.20–5.80)
RDW: 14.1 % (ref 11.0–15.0)
WBC: 8.1 10*3/uL (ref 3.8–10.8)

## 2016-04-20 LAB — RPR

## 2016-04-20 LAB — GC/CHLAMYDIA PROBE AMP
CT Probe RNA: NOT DETECTED
GC Probe RNA: NOT DETECTED

## 2016-04-22 LAB — CT/NG RNA, TMA RECTAL
Chlamydia Trachomatis RNA: NOT DETECTED
Neisseria Gonorrhoeae RNA: NOT DETECTED

## 2016-05-03 ENCOUNTER — Encounter (INDEPENDENT_AMBULATORY_CARE_PROVIDER_SITE_OTHER): Payer: BC Managed Care – PPO | Admitting: *Deleted

## 2016-05-03 VITALS — BP 109/78 | HR 72 | Temp 97.9°F | Wt 211.6 lb

## 2016-05-03 DIAGNOSIS — Z006 Encounter for examination for normal comparison and control in clinical research program: Secondary | ICD-10-CM

## 2016-05-03 LAB — COMPREHENSIVE METABOLIC PANEL
ALK PHOS: 69 U/L (ref 40–115)
ALT: 22 U/L (ref 9–46)
AST: 21 U/L (ref 10–40)
Albumin: 4.3 g/dL (ref 3.6–5.1)
BILIRUBIN TOTAL: 0.4 mg/dL (ref 0.2–1.2)
BUN: 9 mg/dL (ref 7–25)
CALCIUM: 9.4 mg/dL (ref 8.6–10.3)
CO2: 27 mmol/L (ref 20–31)
Chloride: 100 mmol/L (ref 98–110)
Creat: 1.15 mg/dL (ref 0.60–1.35)
Glucose, Bld: 247 mg/dL — ABNORMAL HIGH (ref 65–99)
POTASSIUM: 4.3 mmol/L (ref 3.5–5.3)
Sodium: 136 mmol/L (ref 135–146)
TOTAL PROTEIN: 6.9 g/dL (ref 6.1–8.1)

## 2016-05-03 LAB — CBC WITH DIFFERENTIAL/PLATELET
BASOS ABS: 0 {cells}/uL (ref 0–200)
Basophils Relative: 0 %
EOS PCT: 0 %
Eosinophils Absolute: 0 cells/uL — ABNORMAL LOW (ref 15–500)
HCT: 43.6 % (ref 38.5–50.0)
Hemoglobin: 14.7 g/dL (ref 13.2–17.1)
LYMPHS PCT: 34 %
Lymphs Abs: 2176 cells/uL (ref 850–3900)
MCH: 30.7 pg (ref 27.0–33.0)
MCHC: 33.7 g/dL (ref 32.0–36.0)
MCV: 91 fL (ref 80.0–100.0)
MONOS PCT: 5 %
MPV: 10.3 fL (ref 7.5–12.5)
Monocytes Absolute: 320 cells/uL (ref 200–950)
NEUTROS PCT: 61 %
Neutro Abs: 3904 cells/uL (ref 1500–7800)
PLATELETS: 230 10*3/uL (ref 140–400)
RBC: 4.79 MIL/uL (ref 4.20–5.80)
RDW: 13.9 % (ref 11.0–15.0)
WBC: 6.4 10*3/uL (ref 3.8–10.8)

## 2016-05-03 LAB — CK: CK TOTAL: 100 U/L (ref 7–232)

## 2016-05-03 LAB — LIPASE: Lipase: 13 U/L (ref 7–60)

## 2016-05-03 LAB — AMYLASE: AMYLASE: 162 U/L — AB (ref 0–105)

## 2016-05-03 LAB — PHOSPHORUS: PHOSPHORUS: 3.4 mg/dL (ref 2.5–4.5)

## 2016-05-04 LAB — HIV ANTIBODY (ROUTINE TESTING W REFLEX): HIV 1&2 Ab, 4th Generation: NONREACTIVE

## 2016-05-05 NOTE — Progress Notes (Signed)
Study: A Phase 2b/3 Double Blind Safety and Efficacy Study of Injectable Cabotegravir compared to Daily Oral Tenofovir Disoproxil Fumarate/Emtricitabine (TDF/FTC), For Pre-Exposure Prophylaxis in HIV-Uninfected Cisgender Men and Transgender Women who have sex with Men.  Medication: Investigational Injectable Cabotegravir/placebo compared to Truvada/placebo. Duration: Around 4 years.  Samuel Valentine is here for week 2. He denies any new symptoms. He is 100% adherent with #46 remaining of each medication. HIV rapid is negative. $50 gift card given for visit. Will see him back in 2 weeks. Tacey HeapElisha Ocie Stanzione RN

## 2016-05-12 ENCOUNTER — Telehealth: Payer: Self-pay

## 2016-05-12 NOTE — Telephone Encounter (Signed)
Contact information: . Address:  . Phone:  . Email  . Preferred method of contact  . Is it ok for us to contact you about future prevention studies  Goals:  ? help study participants become as adherent as possible (100% adherence is unlikely/unrealistic) and to provide a space for them to be honest with you about their true adherence ? helping people understand the relationships between their thoughts, feelings, and behaviors and not just giving advice or telling people what to do. Instead try to be: o Nonjudgmental o Collaborative o Normalize participant difficulties This study will be conducted in three phases, or "steps."  . Step I: all participants will receive two oral study products. . Step II: participants will receive one oral product and one injectable product. . Step III: participants will either be offered 48 weeks of open label PrEP Our conversation will primarily focus on: 1. Getting to study appointments, obtaining refills of oral product, and receiving injections on time. 2. Communicating with study staff about questions and concerns. 3. Coping with side effects. 4. Formulating a daily medication schedule and reminders for oral study product. 5. Storing oral study product. 6. Handling missed doses of oral study product and missed injections. 

## 2016-05-18 ENCOUNTER — Encounter (INDEPENDENT_AMBULATORY_CARE_PROVIDER_SITE_OTHER): Payer: Self-pay | Admitting: *Deleted

## 2016-05-18 VITALS — BP 120/80 | HR 85 | Temp 98.2°F | Wt 210.5 lb

## 2016-05-18 DIAGNOSIS — Z006 Encounter for examination for normal comparison and control in clinical research program: Secondary | ICD-10-CM

## 2016-05-18 LAB — COMPREHENSIVE METABOLIC PANEL
ALK PHOS: 78 U/L (ref 40–115)
ALT: 24 U/L (ref 9–46)
AST: 21 U/L (ref 10–40)
Albumin: 4.6 g/dL (ref 3.6–5.1)
BUN: 9 mg/dL (ref 7–25)
CALCIUM: 9.6 mg/dL (ref 8.6–10.3)
CO2: 31 mmol/L (ref 20–31)
Chloride: 100 mmol/L (ref 98–110)
Creat: 1.09 mg/dL (ref 0.60–1.35)
Glucose, Bld: 161 mg/dL — ABNORMAL HIGH (ref 65–99)
POTASSIUM: 4.9 mmol/L (ref 3.5–5.3)
Sodium: 138 mmol/L (ref 135–146)
TOTAL PROTEIN: 7.4 g/dL (ref 6.1–8.1)
Total Bilirubin: 0.4 mg/dL (ref 0.2–1.2)

## 2016-05-18 LAB — CBC WITH DIFFERENTIAL/PLATELET
Basophils Absolute: 0 cells/uL (ref 0–200)
Basophils Relative: 0 %
EOS PCT: 0 %
Eosinophils Absolute: 0 cells/uL — ABNORMAL LOW (ref 15–500)
HCT: 42.1 % (ref 38.5–50.0)
HEMOGLOBIN: 14.3 g/dL (ref 13.2–17.1)
LYMPHS ABS: 2926 {cells}/uL (ref 850–3900)
Lymphocytes Relative: 38 %
MCH: 30.6 pg (ref 27.0–33.0)
MCHC: 34 g/dL (ref 32.0–36.0)
MCV: 90.1 fL (ref 80.0–100.0)
MPV: 9.8 fL (ref 7.5–12.5)
Monocytes Absolute: 462 cells/uL (ref 200–950)
Monocytes Relative: 6 %
NEUTROS ABS: 4312 {cells}/uL (ref 1500–7800)
Neutrophils Relative %: 56 %
Platelets: 225 10*3/uL (ref 140–400)
RBC: 4.67 MIL/uL (ref 4.20–5.80)
RDW: 14.2 % (ref 11.0–15.0)
WBC: 7.7 10*3/uL (ref 3.8–10.8)

## 2016-05-18 LAB — LIPASE: Lipase: 18 U/L (ref 7–60)

## 2016-05-18 LAB — CK: CK TOTAL: 105 U/L (ref 7–232)

## 2016-05-18 LAB — PHOSPHORUS: Phosphorus: 3.3 mg/dL (ref 2.5–4.5)

## 2016-05-18 LAB — AMYLASE: Amylase: 180 U/L — ABNORMAL HIGH (ref 0–105)

## 2016-05-18 NOTE — Progress Notes (Signed)
Study: A Phase 2b/3 Double Blind Safety and Efficacy Study of Injectable Cabotegravir compared to Daily Oral Tenofovir Disoproxil Fumarate/Emtricitabine (TDF/FTC), For Pre-Exposure Prophylaxis in HIV-Uninfected Cisgender Men and Transgender Women who have sex with Men.  Medication: Investigational Injectable Cabotegravir/placebo compared to Truvada/placebo. Duration: Around 4 years.   [name] is here for week 4 visit. No new complaints or concerns verbalized. Excellent adherence with study medication. Pill count done, #31 remaining of each. Rapid HIV non-reactive. Next visit scheduled for 9/5 @ 8:30am.

## 2016-05-19 LAB — HIV ANTIBODY (ROUTINE TESTING W REFLEX): HIV 1&2 Ab, 4th Generation: NONREACTIVE

## 2016-05-25 ENCOUNTER — Encounter (INDEPENDENT_AMBULATORY_CARE_PROVIDER_SITE_OTHER): Payer: Self-pay | Admitting: *Deleted

## 2016-05-25 ENCOUNTER — Telehealth: Payer: Self-pay

## 2016-05-25 VITALS — BP 108/73 | HR 87 | Temp 97.8°F | Wt 209.8 lb

## 2016-05-25 DIAGNOSIS — Z006 Encounter for examination for normal comparison and control in clinical research program: Secondary | ICD-10-CM

## 2016-05-25 NOTE — Progress Notes (Signed)
Study: A Phase 2b/3 Double Blind Safety and Efficacy Study of Injectable Cabotegravir compared to Daily Oral Tenofovir Disoproxil Fumarate/Emtricitabine (TDF/FTC), For Pre-Exposure Prophylaxis in HIV-Uninfected Cisgender Men and Transgender Women who have sex with Men.  Medication: Investigational Injectable Cabotegravir/placebo compared to Truvada/placebo. Duration: Around 4 years.   Samuel Valentine here for week 5 visit. No new complaints or concerns verbalized. Rapid HIV non-reactive. Cabotegravir/placebo injection given (L) gluteal muscle. Site unremarkable. Reviewed possible injection site reactions. Verbalized understanding. Has research clinic contact numbers for questions/concerns if needed. Next visit scheduled for 9/11 at 4:00pm.

## 2016-05-26 LAB — HIV ANTIBODY (ROUTINE TESTING W REFLEX): HIV 1&2 Ab, 4th Generation: NONREACTIVE

## 2016-05-31 ENCOUNTER — Encounter: Payer: Self-pay | Admitting: *Deleted

## 2016-05-31 ENCOUNTER — Encounter (INDEPENDENT_AMBULATORY_CARE_PROVIDER_SITE_OTHER): Payer: BC Managed Care – PPO | Admitting: *Deleted

## 2016-05-31 VITALS — BP 134/81 | HR 85 | Temp 98.4°F | Wt 211.0 lb

## 2016-05-31 DIAGNOSIS — Z006 Encounter for examination for normal comparison and control in clinical research program: Secondary | ICD-10-CM

## 2016-05-31 NOTE — Progress Notes (Signed)
Study: A Phase 2b/3 Double Blind Safety and Efficacy Study of Injectable Cabotegravir compared to Daily Oral Tenofovir Disoproxil Fumarate/Emtricitabine (TDF/FTC), For Pre-Exposure Prophylaxis in HIV-Uninfected Cisgender Men and Transgender Women who have sex with Men.  Medication: Investigational Injectable Cabotegravir/placebo compared to Truvada/placebo. Duration: Around 4 years.  Samuel Valentine is here for week 6. He denies any injection site reaction. He has been adherent with his oral study meds. He denies any symptoms. HIV rapid is negative Questionnaire completed. Vitals stable. He received $50 gift card and declined condoms. Next appointment set for 10/2. Tacey HeapElisha Jeremias Broyhill RN

## 2016-06-01 LAB — CBC WITH DIFFERENTIAL/PLATELET
BASOS ABS: 0 {cells}/uL (ref 0–200)
Basophils Relative: 0 %
EOS ABS: 0 {cells}/uL — AB (ref 15–500)
Eosinophils Relative: 0 %
HEMATOCRIT: 41.1 % (ref 38.5–50.0)
HEMOGLOBIN: 14 g/dL (ref 13.2–17.1)
LYMPHS ABS: 2772 {cells}/uL (ref 850–3900)
Lymphocytes Relative: 42 %
MCH: 30.8 pg (ref 27.0–33.0)
MCHC: 34.1 g/dL (ref 32.0–36.0)
MCV: 90.3 fL (ref 80.0–100.0)
MONO ABS: 396 {cells}/uL (ref 200–950)
MPV: 10.6 fL (ref 7.5–12.5)
Monocytes Relative: 6 %
NEUTROS PCT: 52 %
Neutro Abs: 3432 cells/uL (ref 1500–7800)
Platelets: 238 10*3/uL (ref 140–400)
RBC: 4.55 MIL/uL (ref 4.20–5.80)
RDW: 14.5 % (ref 11.0–15.0)
WBC: 6.6 10*3/uL (ref 3.8–10.8)

## 2016-06-01 LAB — COMPREHENSIVE METABOLIC PANEL
ALBUMIN: 4.5 g/dL (ref 3.6–5.1)
ALT: 34 U/L (ref 9–46)
AST: 29 U/L (ref 10–40)
Alkaline Phosphatase: 71 U/L (ref 40–115)
BILIRUBIN TOTAL: 0.4 mg/dL (ref 0.2–1.2)
BUN: 9 mg/dL (ref 7–25)
CHLORIDE: 101 mmol/L (ref 98–110)
CO2: 27 mmol/L (ref 20–31)
CREATININE: 1.03 mg/dL (ref 0.60–1.35)
Calcium: 9.7 mg/dL (ref 8.6–10.3)
Glucose, Bld: 156 mg/dL — ABNORMAL HIGH (ref 65–99)
Potassium: 4.4 mmol/L (ref 3.5–5.3)
SODIUM: 139 mmol/L (ref 135–146)
TOTAL PROTEIN: 7.3 g/dL (ref 6.1–8.1)

## 2016-06-01 LAB — LIPASE: LIPASE: 32 U/L (ref 7–60)

## 2016-06-01 LAB — CK: Total CK: 116 U/L (ref 7–232)

## 2016-06-01 LAB — PHOSPHORUS: Phosphorus: 3.5 mg/dL (ref 2.5–4.5)

## 2016-06-01 LAB — AMYLASE: Amylase: 195 U/L — ABNORMAL HIGH (ref 0–105)

## 2016-06-01 LAB — HIV ANTIBODY (ROUTINE TESTING W REFLEX): HIV: NONREACTIVE

## 2016-06-22 ENCOUNTER — Encounter (INDEPENDENT_AMBULATORY_CARE_PROVIDER_SITE_OTHER): Payer: BC Managed Care – PPO | Admitting: *Deleted

## 2016-06-22 VITALS — BP 122/75 | HR 91 | Temp 98.0°F | Wt 209.0 lb

## 2016-06-22 DIAGNOSIS — Z006 Encounter for examination for normal comparison and control in clinical research program: Secondary | ICD-10-CM

## 2016-06-22 LAB — CBC WITH DIFFERENTIAL/PLATELET
BASOS PCT: 0 %
Basophils Absolute: 0 cells/uL (ref 0–200)
Eosinophils Absolute: 51 cells/uL (ref 15–500)
Eosinophils Relative: 1 %
HEMATOCRIT: 41.9 % (ref 38.5–50.0)
HEMOGLOBIN: 13.8 g/dL (ref 13.2–17.1)
LYMPHS ABS: 2397 {cells}/uL (ref 850–3900)
Lymphocytes Relative: 47 %
MCH: 30.9 pg (ref 27.0–33.0)
MCHC: 32.9 g/dL (ref 32.0–36.0)
MCV: 93.9 fL (ref 80.0–100.0)
MONO ABS: 357 {cells}/uL (ref 200–950)
MPV: 10.1 fL (ref 7.5–12.5)
Monocytes Relative: 7 %
NEUTROS ABS: 2295 {cells}/uL (ref 1500–7800)
Neutrophils Relative %: 45 %
Platelets: 210 10*3/uL (ref 140–400)
RBC: 4.46 MIL/uL (ref 4.20–5.80)
RDW: 14.5 % (ref 11.0–15.0)
WBC: 5.1 10*3/uL (ref 3.8–10.8)

## 2016-06-22 NOTE — Progress Notes (Signed)
Study: A Phase 2b/3 Double Blind Safety and Efficacy Study of Injectable Cabotegravir compared to Daily Oral Tenofovir Disoproxil Fumarate/Emtricitabine (TDF/FTC), For Pre-Exposure Prophylaxis in HIV-Uninfected Cisgender Men and Transgender Women who have sex with Men.  Medication: Investigational Injectable Cabotegravir/placebo compared to Truvada/placebo. Duration: Around 4 years.  Samuel Valentine is here for week 9. He denies any new problems and/or medications. Has been stressed at his work place. He is currently looking for other jobs and has applied to one. He has been adherent with his study medications. He returns #5 pills. HIV rapid is negative. Questionnaires completed. Vitals stable. Injection given in Right Gluteal Muscle. He received $50 gift card for visit. Will see him 10/6 for safety check. Tacey HeapElisha Epperson RN

## 2016-06-23 LAB — LIPASE: Lipase: 16 U/L (ref 7–60)

## 2016-06-23 LAB — AMYLASE: Amylase: 152 U/L — ABNORMAL HIGH (ref 0–105)

## 2016-06-23 LAB — COMPREHENSIVE METABOLIC PANEL
ALBUMIN: 4.2 g/dL (ref 3.6–5.1)
ALT: 27 U/L (ref 9–46)
AST: 23 U/L (ref 10–40)
Alkaline Phosphatase: 69 U/L (ref 40–115)
BILIRUBIN TOTAL: 0.5 mg/dL (ref 0.2–1.2)
BUN: 10 mg/dL (ref 7–25)
CO2: 28 mmol/L (ref 20–31)
CREATININE: 1.15 mg/dL (ref 0.60–1.35)
Calcium: 9.2 mg/dL (ref 8.6–10.3)
Chloride: 101 mmol/L (ref 98–110)
Glucose, Bld: 233 mg/dL — ABNORMAL HIGH (ref 65–99)
Potassium: 4.6 mmol/L (ref 3.5–5.3)
SODIUM: 137 mmol/L (ref 135–146)
TOTAL PROTEIN: 6.6 g/dL (ref 6.1–8.1)

## 2016-06-23 LAB — CK: Total CK: 94 U/L (ref 7–232)

## 2016-06-23 LAB — PHOSPHORUS: PHOSPHORUS: 3.1 mg/dL (ref 2.5–4.5)

## 2016-06-23 LAB — HIV ANTIBODY (ROUTINE TESTING W REFLEX): HIV 1&2 Ab, 4th Generation: NONREACTIVE

## 2016-06-25 ENCOUNTER — Encounter (INDEPENDENT_AMBULATORY_CARE_PROVIDER_SITE_OTHER): Payer: BC Managed Care – PPO | Admitting: *Deleted

## 2016-06-25 VITALS — BP 119/81 | HR 80 | Temp 98.1°F | Wt 207.0 lb

## 2016-06-25 DIAGNOSIS — Z006 Encounter for examination for normal comparison and control in clinical research program: Secondary | ICD-10-CM

## 2016-06-25 LAB — COMPREHENSIVE METABOLIC PANEL
ALBUMIN: 4.2 g/dL (ref 3.6–5.1)
ALK PHOS: 71 U/L (ref 40–115)
ALT: 24 U/L (ref 9–46)
AST: 26 U/L (ref 10–40)
BILIRUBIN TOTAL: 0.6 mg/dL (ref 0.2–1.2)
BUN: 8 mg/dL (ref 7–25)
CALCIUM: 9.2 mg/dL (ref 8.6–10.3)
CO2: 28 mmol/L (ref 20–31)
CREATININE: 1.15 mg/dL (ref 0.60–1.35)
Chloride: 98 mmol/L (ref 98–110)
Glucose, Bld: 293 mg/dL — ABNORMAL HIGH (ref 65–99)
Potassium: 4.6 mmol/L (ref 3.5–5.3)
Sodium: 135 mmol/L (ref 135–146)
Total Protein: 6.9 g/dL (ref 6.1–8.1)

## 2016-06-25 LAB — CBC WITH DIFFERENTIAL/PLATELET
BASOS ABS: 0 {cells}/uL (ref 0–200)
BASOS PCT: 0 %
EOS ABS: 48 {cells}/uL (ref 15–500)
Eosinophils Relative: 1 %
HEMATOCRIT: 44.5 % (ref 38.5–50.0)
Hemoglobin: 14.8 g/dL (ref 13.2–17.1)
LYMPHS PCT: 54 %
Lymphs Abs: 2592 cells/uL (ref 850–3900)
MCH: 30.9 pg (ref 27.0–33.0)
MCHC: 33.3 g/dL (ref 32.0–36.0)
MCV: 92.9 fL (ref 80.0–100.0)
MONO ABS: 384 {cells}/uL (ref 200–950)
MPV: 10 fL (ref 7.5–12.5)
Monocytes Relative: 8 %
Neutro Abs: 1776 cells/uL (ref 1500–7800)
Neutrophils Relative %: 37 %
Platelets: 233 10*3/uL (ref 140–400)
RBC: 4.79 MIL/uL (ref 4.20–5.80)
RDW: 14.3 % (ref 11.0–15.0)
WBC: 4.8 10*3/uL (ref 3.8–10.8)

## 2016-06-25 LAB — LIPASE: LIPASE: 34 U/L (ref 7–60)

## 2016-06-25 LAB — AMYLASE: Amylase: 155 U/L — ABNORMAL HIGH (ref 0–105)

## 2016-06-25 LAB — CK: Total CK: 71 U/L (ref 7–232)

## 2016-06-25 LAB — PHOSPHORUS: Phosphorus: 2.6 mg/dL (ref 2.5–4.5)

## 2016-06-25 NOTE — Progress Notes (Signed)
Study: A Phase 2b/3 Double Blind Safety and Efficacy Study of Injectable Cabotegravir compared to Daily Oral Tenofovir Disoproxil Fumarate/Emtricitabine (TDF/FTC), For Pre-Exposure Prophylaxis in HIV-Uninfected Cisgender Men and Transgender Women who have sex with Men.  Medication: Investigational Injectable Cabotegravir/placebo compared to Truvada/placebo. Duration: Around 4 years.  Samuel Valentine is here for week 10. He denies any new issues. He did not have any reaction to the injection. Negative for tenderness, redness, swelling, and warm at injection site. HIV Rapid is negative. He received $50 gift card and will see him 11/27 @ 8:30am. Tacey HeapElisha Ilaisaane Marts RN

## 2016-06-26 LAB — HIV ANTIBODY (ROUTINE TESTING W REFLEX): HIV 1&2 Ab, 4th Generation: NONREACTIVE

## 2016-07-01 NOTE — Telephone Encounter (Signed)
note

## 2016-07-05 ENCOUNTER — Ambulatory Visit: Payer: BC Managed Care – PPO | Admitting: "Endocrinology

## 2016-07-05 ENCOUNTER — Encounter: Payer: Self-pay | Admitting: Family Medicine

## 2016-07-05 ENCOUNTER — Ambulatory Visit (INDEPENDENT_AMBULATORY_CARE_PROVIDER_SITE_OTHER): Payer: BC Managed Care – PPO | Admitting: Family Medicine

## 2016-07-05 VITALS — BP 120/60 | HR 98 | Wt 209.0 lb

## 2016-07-05 DIAGNOSIS — B081 Molluscum contagiosum: Secondary | ICD-10-CM

## 2016-07-05 DIAGNOSIS — R0781 Pleurodynia: Secondary | ICD-10-CM | POA: Diagnosis not present

## 2016-07-05 NOTE — Patient Instructions (Addendum)
Use your favorite pain medicine as needed and if still having pain in a week, return here. Take 2 Aleve twice per day

## 2016-07-05 NOTE — Progress Notes (Signed)
   Subjective:    Patient ID: Samuel DamesJeffrey Valentine, male    DOB: 24-Aug-1971, 45 y.o.   MRN: [id number]  HPI He is here for evaluation of right-sided rib pain. This is been going on for a proximally 5 days. Motion and coughing does make this worse is had no sneezing, shortness of breath. He does not remember any particular injury. So has a lesion on his penis that he would like evaluated.  Review of Systems     Objective:   Physical Exam Alert and in no distress but does complain of pain with breathing as well as any movement. There is some slight tenderness palpation in the right lateral rib area but no point tenderness. Abdominal exam shows no masses or tenderness. Lungs are clear to auscultation. Exam of the penis does show one small molluscum type lesion. Further examination shows no other lesions.      Assessment & Plan:  Molluscum contagiosum  Rib pain on right side Recommend conservative care for the rib pain with anti-inflammatories. The molluscum lesion was frozen and excised without difficulty. Cautioned him to look for other lesion since this usually occurs with 6-12 lesions at one time.

## 2016-07-16 ENCOUNTER — Encounter (INDEPENDENT_AMBULATORY_CARE_PROVIDER_SITE_OTHER): Payer: Self-pay | Admitting: "Endocrinology

## 2016-07-16 ENCOUNTER — Ambulatory Visit (INDEPENDENT_AMBULATORY_CARE_PROVIDER_SITE_OTHER): Payer: BC Managed Care – PPO | Admitting: "Endocrinology

## 2016-07-16 VITALS — BP 128/78 | HR 93 | Wt 203.0 lb

## 2016-07-16 DIAGNOSIS — F4320 Adjustment disorder, unspecified: Secondary | ICD-10-CM

## 2016-07-16 DIAGNOSIS — E1065 Type 1 diabetes mellitus with hyperglycemia: Secondary | ICD-10-CM

## 2016-07-16 DIAGNOSIS — I10 Essential (primary) hypertension: Secondary | ICD-10-CM | POA: Diagnosis not present

## 2016-07-16 DIAGNOSIS — E785 Hyperlipidemia, unspecified: Secondary | ICD-10-CM | POA: Diagnosis not present

## 2016-07-16 DIAGNOSIS — IMO0001 Reserved for inherently not codable concepts without codable children: Secondary | ICD-10-CM

## 2016-07-16 DIAGNOSIS — E10649 Type 1 diabetes mellitus with hypoglycemia without coma: Secondary | ICD-10-CM

## 2016-07-16 DIAGNOSIS — E049 Nontoxic goiter, unspecified: Secondary | ICD-10-CM | POA: Diagnosis not present

## 2016-07-16 DIAGNOSIS — E1069 Type 1 diabetes mellitus with other specified complication: Secondary | ICD-10-CM

## 2016-07-16 DIAGNOSIS — E063 Autoimmune thyroiditis: Secondary | ICD-10-CM

## 2016-07-16 DIAGNOSIS — E1042 Type 1 diabetes mellitus with diabetic polyneuropathy: Secondary | ICD-10-CM | POA: Diagnosis not present

## 2016-07-16 LAB — POCT GLYCOSYLATED HEMOGLOBIN (HGB A1C): Hemoglobin A1C: 8.6

## 2016-07-16 LAB — GLUCOSE, POCT (MANUAL RESULT ENTRY): POC Glucose: 100 mg/dl — AB (ref 70–99)

## 2016-07-16 MED ORDER — LANTUS SOLOSTAR 100 UNIT/ML ~~LOC~~ SOPN: PEN_INJECTOR | SUBCUTANEOUS | 6 refills | Status: DC

## 2016-07-16 NOTE — Patient Instructions (Signed)
Follow up visit in 3 months. 

## 2016-07-16 NOTE — Progress Notes (Signed)
CC: FU T1DM, hypoglycemia, hypertension, hyperlipidemia, autonomic neuropathy, tachycardia, gastroparesis, peripheral neuropathy, thyroiditis, goiter, hepatitis B, fatigue  HPI: [name] is a 45 y.o. Caucasian gentleman. He was unaccompanied.   1. Samuel Valentine was diagnosed with T2DM about 2002-2003. He was treated with metformin, Avandia, and Amaryl, but BGs were not well controlled, so Samuel Valentine was begun in 2005. When it became apparent that he would need a multiple daily injection regimen with Samuel Valentine and Novolog, he was referred to me by Ms. Samuel Coffin, RN, CDE, of the [facility]. I saw him for the first time on 02/12/05. He had just been started on Novolog aspart insulin at meals (120/25/10 plan). His PMH included recurrent hypoglycemia, hypertension, dyslipidemia, and proteinuria. He was also allergic to penicillin. He had had oral surgery in the past, but no other surgery. His psychiatric diagnoses included depression, anxiety, and dissociative identity disorder. Social history included the fact that he was gay. All HIV tests had been negative through that time.  He had recently been laid off and had no health insurance. In March 2006, his HbA1c had been 10.6%. On exam he had a goiter and mild peripheral neuropathy. 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 We accepted him as a charity patient and enrolled him in our Diabetes Survival Skills Program. He was converted to a Medtronic 508 insulin pump in August 2006.   2. During the next ten  years his DM self-care efforts and HbA1c values waxed and waned, with A1c values ranging from 6.4%-9.2%. His worst BGs occurred when he was being treated with interferon for hepatitis B, from which he is now in remission. He has been gainfully employed at [employer], [employer], and again at [employer] for several years and has had health insurance. In 2011 we changed him to a Medtronic 723 (Revel) pump and started  him on the Medtronic CGM sensor. He is now on a Medtronic 530G insulin pump. Since the introduction of CGM-pump therapy, his BGs have significantly improved when he has been consistent with his T1DM self-care.    3. His last [facility] visit was on 03/31/16. His HbA1c then was 8.4%. In the interim he has been healthy, but his "BGS have been all over the place, both highs and lows". He has been trying to eat better, is walking more, and has lost a few pounds. He has been changing sites about every 3-5 days. He uses Novolog aspart in his Medtronic insulin pump. He wears the Medtronic Enlite sensor much of the time. He has been taking enalapril, 10 mg once daily, 10 mg of atorvastatin once daily, Centrum Silver, and two 81 mg ASA per day. He is also on Adderal for adult ADD. He no longer takes Judsonia. His work situation and marriage are doing well.  He has also been traveling for work more often. At his last visit at [facility] on 03/01/16 his liver tests were good. He will have a follow up visit in December.  4. Pertinent Review of Systems: Constitutional: The patient feels "pretty good". He sometimes gets depressed and anxious if things are too stressful at work. He is much more aware when his BGs trend downward now. He has few physical complaints. Eyes: Vision is pretty good. His last eye exam was in June 2017. There were no signs of diabetic eye disease. He has presbyopia, but no other problems.  Neck:  He has not had any discomfort in his anterior  neck since his last visit.  Heart: He occasionally notes faster heart rate when he is drinking more caffeine, which he usually does when he feels stressed. Heart rate increases with exercise or other physical activity. He has not had any chest wall pains. He has no other complaints of irregular heat beats, chest pain, or chest pressure. Gastrointestinal: His reflux has not been much of an issue for him recently. He has constipation rarely if he doesn't  drink enough fluid and take enough fiber. He does have occasional abdominal bloating, usually after eating a lot of fresh vegetables. Hands: His hand rash has not recurred while using Lamisil or Lotrimin.  Legs: Muscle mass and strength seem normal. There are no complaints of numbness, tingling, burning, or pain. No edema is noted.  Feet:  He continues to apply Lamisil or Lotrimin cream as a preventive measure about twice a week  His feet no longer itch and tingle. There are no complaints of numbness, burning, or pain. No edema is noted. Hypoglycemia: He has had "more low BGs". He tends to let his BGs run a "little bit high" when he is traveling.   5 . BG printout: He changes sites every 4-5  days. He checks BGs 2-6 times per day. The longer his sites remain in place, the higher the BGs tend to be. His BG results vary based upon the number of BG checks he does, how often he uses his sensor, the intensity and duration of exercise, whether or not he is sick, whether he boluses at meals, and the tendency of his sites to go bad with prolonged use. Some sites work better than others. He boluses from 3-7 times per day. He still often gives correction boluses based upon his CGM value rather than checking BGs. He also sometimes just gives manual boluses. His average BG is 249, compared with 225 at last visit and with 224 at the prior visit.   6. CGM sensor readout: Overall his sensor values and BG values correlate quite well. The sensor glucose values vary substantially hour to hour and day to day. He has had 1 "Threshold suspend" late in the evening after bolusing for a BG of >400 at dinner. His sensor values reflect his highly variable meal schedule, carb intakes, activity levels, bolusing, and stress levels. He has lower BGs between 6-10 AM and between 6 PM-11 PM, higher BGs at other times.Marland Kitchen.   PAST MEDICAL, FAMILY, AND SOCIAL HISTORY:  1. 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 [place]  2. He has been walking more.     3. Primary care provider: Dr. Sharlot GowdaJohn Lalonde  REVIEW OF SYSTEMS: Samuel Valentine does not have any significant problems involving his other body systems.  PHYSICAL EXAM: BP 128/78   Pulse 93   Wt 203 lb (92.1 kg)   BMI 26.78 kg/m      Constitutional: Samuel Valentine looks healthy and a bit slimmer. He has lost 4 pounds since his last visit. He appears physically and emotionally well. His affect, engagement, and insight are normal.  Eyes: There is no arcus or proptosis.  Mouth: The oropharynx appears normal. The tongue appears normal. There is normal oral moisture. There is no obvious gingivitis. Neck: There are no bruits present. The thyroid gland appears slightly enlarged. The thyroid gland is smaller at 21 grams in size. Both lobes are slightly enlarged. The consistency of the  thyroid gland is normal. The thyroid gland is not tender to palpation today.   Lungs: The lungs are clear. Air movement is good. Heart: The heart rhythm and rate appear normal. Heart sounds S1 and S2 are normal. I do not appreciate any pathologic heart murmurs. Abdomen: The abdomen is enlarged, but smaller. Bowel sounds are normal. The abdomen is soft and non-tender. There is no obviously palpable hepatomegaly, splenomegaly, or other masses.  Arms: Muscle mass appears appropriate for age.  Hands: There is no obvious tremor. Phalangeal and metacarpophalangeal joints appear normal. His rash has resolved.  Legs: Muscle mass appears appropriate for age. There is no edema.  Feet: There are no significant deformities. Dorsalis pedis pulses are 2+ bilaterally. His feet look good. He has no obvious tinea pedis of the feet today.  Neurologic: Muscle strength is normal for age and gender in both the upper and the lower extremities. Muscle tone appears normal. Sensation to touch is normal in the legs, but slightly decreased in the heels today.     LABS:   Labs 07/16/16: HbA1c 8.6%   Labs 06/25/16: Surveillance labs for hepatology: HIV negative; Amylase 155 (ref 0-105), lipase 34 (ref 7-60); CBC normal; CK 71 (ref 7-232); CMP normal except glucose 293; phosphorus 2.6 (ref 2.5-4.5)  Labs 03/31/16: HbA1c 8.4%; TSH 1.07, free T4 1.1, free T3 3.4; cholesterol 162, triglycerides 49, HDL 86, LDL 66; urinary microalbumin/creatinine ratio 1; CMP normal, except for glucose 173;   Labs 12/30/15: HbA1c 8.1%  Labs 10/01/15: HbA1c 7.8%  Labs 11/18/14; HbA1c 8.9%, compared with 7.7% at last visit and with 7.0% at the visit prior. CMP normal except for glucose of 173, normal AST and ALT; cholesterol 161, triglycerides 8, HDL 67, LDL 78; urinary microalbumin/creatinine ratio 2.5; TSH 1.357, free T4 1.04, free T3 3.0  Labs 12/06/12: CMP with normal AST and ALT. Cholesterol 136, triglycerides 74, HDL 47 (decreased from 58 when he was exercising), LDL 74; microalbumin/creatinine ratio 2.3; TSH 1.782, free T4 0.98, free T3 3.1  Labs 02/03/12: CMP: Normal; TSH 1.565, free T4 0.98, free T3 3.0; cholesterol 156, triglycerides 125, HDL 58, LDL 73; Urinary microalbumin/creatinine ratio 4.2.  ASSESSMENT:  1. T1DM: His BG control is slightly worse, both in terms of a higher average BG, higher HbA1c, and in terms of having more BGs >300. He is not changing his sites often enough and is missing more boluses this month than at his last visit. He has been doing a pretty good job of wearing his sensor. It is clear that when his sites stay in for more than three days, BGs tend to rise. He needs to be more consistent with his diet and his exercise regimen. It is clear that the more often he checks BGs and take both correction boluses and food boluses, the better his BGs can be.  2. Hypoglycemia: He is not having as many low BGs. His significant low BGs usually trigger the Threshold Suspend function, so his BGs do not usually drop too low.    3. Thyroiditis: His thyroiditis  is clinically quiescent today. 4. Goiter: The thyroid gland has decreased in size since his last visit. The waxing and waning of thyroid gland size is c/w evolving Hashimoto's disease.  He was euthyroid in March 2014, February 2016, and July 2017.   5. Hypertension: His DBP is still a bit too high. He needs to continue to exercise and to lose weight.  6. Hyperlipidemia: Lipid panel results in March 2014, February 2016, and  July 2017 were good overall.  7. Autonomic neuropathy, tachycardia, and gastroparesis: With the worsening of BG control he has had, his neuropathy, tachycardia, and gastroparesis worsened in parallel.   8. Peripheral neuropathy: His neuropathy is very mild today.  9. Adjustment reaction: He is doing very well today.  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 His LFTs and total bilirubin are quite normal. I asked Samuel Valentine to talk with his hepatologist about this issue.   PLAN:  1. Diagnostic: HbA1c and CBG today. 2. Therapeutic:   A. Take better care of your T1DM. Try to change pump sites every three days. Use Lamisil or other anti-fungal agent for tinea pedis. Continue enalapril and atorvastatin. Eat right. Exercise right. Don't try too hard to avoid hypoglycemia. Be prepared to reduce basal rates and/or to use temporary basal rates when exercising or eating late. Please subtract 50-100-150 points of BG after exercise.  B. Continue current bolus settings. Use TBRs for exercise. New basal rates: MN: 0.850 -> 0.900 3 AM: 1.30 -> 1.35 4:30 AM: 1.40 8 AM: 1.15 -> 1.25 12:00 noon: 0.850 -> 0.90 New 5 PM: 0.825 7 PM: 1.10 -> 1.20 3. Patient education: We discussed life style changes at length. He needs to stay on track consistently.   4. Follow up: FU appointment in 3 months.  Level of Service: This visit lasted in excess of 45 minutes. More than 50% of the visit was devoted to  counseling.  David Stall MD, CDE Adult and Pediatric Endocrinology

## 2016-08-06 ENCOUNTER — Other Ambulatory Visit: Payer: Self-pay | Admitting: "Endocrinology

## 2016-08-16 ENCOUNTER — Encounter (INDEPENDENT_AMBULATORY_CARE_PROVIDER_SITE_OTHER): Payer: Self-pay | Admitting: *Deleted

## 2016-08-16 VITALS — BP 108/72 | HR 80 | Temp 98.4°F | Wt 211.2 lb

## 2016-08-16 DIAGNOSIS — Z006 Encounter for examination for normal comparison and control in clinical research program: Secondary | ICD-10-CM

## 2016-08-16 LAB — CBC WITH DIFFERENTIAL/PLATELET
BASOS PCT: 0 %
Basophils Absolute: 0 cells/uL (ref 0–200)
EOS ABS: 57 {cells}/uL (ref 15–500)
Eosinophils Relative: 1 %
HEMATOCRIT: 44 % (ref 38.5–50.0)
Hemoglobin: 14.2 g/dL (ref 13.2–17.1)
Lymphocytes Relative: 46 %
Lymphs Abs: 2622 cells/uL (ref 850–3900)
MCH: 30.6 pg (ref 27.0–33.0)
MCHC: 32.3 g/dL (ref 32.0–36.0)
MCV: 94.8 fL (ref 80.0–100.0)
MONO ABS: 513 {cells}/uL (ref 200–950)
MPV: 10.1 fL (ref 7.5–12.5)
Monocytes Relative: 9 %
NEUTROS ABS: 2508 {cells}/uL (ref 1500–7800)
Neutrophils Relative %: 44 %
PLATELETS: 185 10*3/uL (ref 140–400)
RBC: 4.64 MIL/uL (ref 4.20–5.80)
RDW: 13.9 % (ref 11.0–15.0)
WBC: 5.7 10*3/uL (ref 3.8–10.8)

## 2016-08-16 LAB — COMPREHENSIVE METABOLIC PANEL
ALBUMIN: 4.1 g/dL (ref 3.6–5.1)
ALT: 46 U/L (ref 9–46)
AST: 49 U/L — ABNORMAL HIGH (ref 10–40)
Alkaline Phosphatase: 107 U/L (ref 40–115)
BUN: 8 mg/dL (ref 7–25)
CALCIUM: 8.9 mg/dL (ref 8.6–10.3)
CHLORIDE: 98 mmol/L (ref 98–110)
CO2: 26 mmol/L (ref 20–31)
Creat: 1.15 mg/dL (ref 0.60–1.35)
Glucose, Bld: 322 mg/dL — ABNORMAL HIGH (ref 65–99)
POTASSIUM: 5 mmol/L (ref 3.5–5.3)
Sodium: 135 mmol/L (ref 135–146)
Total Bilirubin: 0.6 mg/dL (ref 0.2–1.2)
Total Protein: 7.1 g/dL (ref 6.1–8.1)

## 2016-08-16 LAB — CK: CK TOTAL: 93 U/L (ref 7–232)

## 2016-08-16 LAB — PHOSPHORUS: PHOSPHORUS: 2.9 mg/dL (ref 2.5–4.5)

## 2016-08-16 LAB — LIPASE: LIPASE: 12 U/L (ref 7–60)

## 2016-08-16 LAB — AMYLASE: Amylase: 152 U/L — ABNORMAL HIGH (ref 0–105)

## 2016-08-16 NOTE — Progress Notes (Signed)
Study: A Phase 2b/3 Double Blind Safety and Efficacy Study of Injectable Cabotegravir compared to Daily Oral Tenofovir Disoproxil Fumarate/Emtricitabine (TDF/FTC), For Pre-Exposure Prophylaxis in HIV-Uninfected Cisgender Men and Transgender Women who have sex with Men.  Medication: Investigational Injectable Cabotegravir/placebo compared to Truvada/placebo. Duration: Around 4 years.  Samuel Valentine is here for week 17 visit. Currently dealing with cold symptoms (sinus and chest congestion, non-productive cough, body aches, sore throat and fatigue). States symptoms started 4-5 days ago. Using OTC medications to treat symptoms. Afebrile. Rapid HIV non-reactive. Cabotegravir/placebo injection given (R) gluteal muscle. Site unremarkable. Next visit scheduled for 12/11 @ 8:30am.

## 2016-08-17 LAB — HIV ANTIBODY (ROUTINE TESTING W REFLEX): HIV: NONREACTIVE

## 2016-08-30 ENCOUNTER — Encounter (INDEPENDENT_AMBULATORY_CARE_PROVIDER_SITE_OTHER): Payer: BC Managed Care – PPO | Admitting: *Deleted

## 2016-08-30 VITALS — BP 115/79 | HR 73 | Temp 98.1°F | Wt 213.2 lb

## 2016-08-30 DIAGNOSIS — Z006 Encounter for examination for normal comparison and control in clinical research program: Secondary | ICD-10-CM

## 2016-08-30 LAB — CBC WITH DIFFERENTIAL/PLATELET
BASOS PCT: 0 %
Basophils Absolute: 0 cells/uL (ref 0–200)
EOS ABS: 61 {cells}/uL (ref 15–500)
Eosinophils Relative: 1 %
HCT: 41.1 % (ref 38.5–50.0)
Hemoglobin: 13.6 g/dL (ref 13.2–17.1)
Lymphocytes Relative: 58 %
Lymphs Abs: 3538 cells/uL (ref 850–3900)
MCH: 30.8 pg (ref 27.0–33.0)
MCHC: 33.1 g/dL (ref 32.0–36.0)
MCV: 93.2 fL (ref 80.0–100.0)
MONO ABS: 549 {cells}/uL (ref 200–950)
MONOS PCT: 9 %
MPV: 9.7 fL (ref 7.5–12.5)
NEUTROS ABS: 1952 {cells}/uL (ref 1500–7800)
Neutrophils Relative %: 32 %
PLATELETS: 292 10*3/uL (ref 140–400)
RBC: 4.41 MIL/uL (ref 4.20–5.80)
RDW: 13.7 % (ref 11.0–15.0)
WBC: 6.1 10*3/uL (ref 3.8–10.8)

## 2016-08-30 LAB — COMPREHENSIVE METABOLIC PANEL
ALK PHOS: 96 U/L (ref 40–115)
ALT: 24 U/L (ref 9–46)
AST: 24 U/L (ref 10–40)
Albumin: 4.4 g/dL (ref 3.6–5.1)
BILIRUBIN TOTAL: 0.5 mg/dL (ref 0.2–1.2)
BUN: 9 mg/dL (ref 7–25)
CO2: 29 mmol/L (ref 20–31)
CREATININE: 1 mg/dL (ref 0.60–1.35)
Calcium: 9.5 mg/dL (ref 8.6–10.3)
Chloride: 102 mmol/L (ref 98–110)
Glucose, Bld: 87 mg/dL (ref 65–99)
POTASSIUM: 4.2 mmol/L (ref 3.5–5.3)
Sodium: 139 mmol/L (ref 135–146)
TOTAL PROTEIN: 7 g/dL (ref 6.1–8.1)

## 2016-08-30 LAB — PHOSPHORUS: PHOSPHORUS: 3.2 mg/dL (ref 2.5–4.5)

## 2016-08-30 NOTE — Progress Notes (Signed)
Samuel Valentine is here for his week 19 visit for Study: A Phase 2b/3 Double Blind Safety and Efficacy Study of Injectable Cabotegravir compared to Daily Oral Tenofovir Disoproxil Fumarate/Emtricitabine (TDF/FTC), For Pre-Exposure Prophylaxis in HIV-Uninfected Cisgender Men and Transgender Women who have sex with Men.  Medication: Investigational Injectable Cabotegravir/placebo compared to Truvada/placebo. Duration: Around 4 years.  He says his cold finally resolved around 12/9 and he didn't notice any injection site reaction. He has been having vivid dreams over the past 2 weeks. He states his adherence to his medications is very good, missed one a while ago but now takes them regularly at night. He will return in January for the next visit.

## 2016-08-31 LAB — LIPASE: Lipase: 41 U/L (ref 7–60)

## 2016-08-31 LAB — CK: Total CK: 101 U/L (ref 7–232)

## 2016-08-31 LAB — HIV ANTIBODY (ROUTINE TESTING W REFLEX): HIV: NONREACTIVE

## 2016-08-31 LAB — AMYLASE: Amylase: 299 U/L — ABNORMAL HIGH (ref 0–105)

## 2016-09-02 ENCOUNTER — Other Ambulatory Visit: Payer: Self-pay | Admitting: "Endocrinology

## 2016-09-12 ENCOUNTER — Other Ambulatory Visit: Payer: Self-pay | Admitting: "Endocrinology

## 2016-09-30 ENCOUNTER — Encounter: Payer: Self-pay | Admitting: Family Medicine

## 2016-09-30 ENCOUNTER — Ambulatory Visit (INDEPENDENT_AMBULATORY_CARE_PROVIDER_SITE_OTHER): Payer: BC Managed Care – PPO | Admitting: Family Medicine

## 2016-09-30 VITALS — BP 90/60 | HR 105 | Temp 101.1°F | Wt 211.0 lb

## 2016-09-30 DIAGNOSIS — R112 Nausea with vomiting, unspecified: Secondary | ICD-10-CM

## 2016-09-30 DIAGNOSIS — R509 Fever, unspecified: Secondary | ICD-10-CM

## 2016-09-30 DIAGNOSIS — J111 Influenza due to unidentified influenza virus with other respiratory manifestations: Secondary | ICD-10-CM

## 2016-09-30 LAB — POC INFLUENZA A&B (BINAX/QUICKVUE)
Influenza A, POC: NEGATIVE
Influenza B, POC: NEGATIVE

## 2016-09-30 MED ORDER — ONDANSETRON 4 MG PO TBDP: 4.0000 mg | ORAL_TABLET | Freq: Three times a day (TID) | ORAL | 0 refills | Status: DC | PRN

## 2016-09-30 MED ORDER — OSELTAMIVIR PHOSPHATE 75 MG PO CAPS: 75.0000 mg | ORAL_CAPSULE | Freq: Two times a day (BID) | ORAL | 0 refills | Status: DC

## 2016-09-30 NOTE — Patient Instructions (Signed)
Sips of liquids rather than big gulps. Tylenol Advil or Aleve whatever works best for aches and pains. NyQuil at night

## 2016-09-30 NOTE — Progress Notes (Signed)
   Subjective:    Patient ID: Samuel DamesJeffrey Valentine, male    DOB: 1971/03/27, 46 y.o.   MRN: [id number]  HPI He complains of a three-day history of fever, chills, myalgias and now is slightly dry cough and couple of episodes of vomiting. His blood sugars seem to be doing okay.   Review of Systems     Objective:   Physical Exam Alert and toxic appearing. Tympanic membranes and canals are normal. Pharyngeal area is normal. Neck is supple without adenopathy or thyromegaly. Cardiac exam shows a regular sinus rhythm without murmurs or gallops. Lungs are clear to auscultation. Flu Test negative.       Assessment & Plan:  Fever and chills - Plan: POC Influenza A&B (Binax test)  Flu syndrome - Plan: oseltamivir (TAMIFLU) 75 MG capsule  Nausea and vomiting, intractability of vomiting not specified, unspecified vomiting type - Plan: ondansetron (ZOFRAN-ODT) 4 MG disintegrating tablet In spite of the negative test, he has clinical symptoms consistent with the flu. Because of his other underlying medical conditions a think Tamiflu would be appropriate. Will also give him ondansetron to help with the nausea.

## 2016-10-19 ENCOUNTER — Ambulatory Visit (INDEPENDENT_AMBULATORY_CARE_PROVIDER_SITE_OTHER): Payer: BC Managed Care – PPO | Admitting: "Endocrinology

## 2016-10-20 ENCOUNTER — Ambulatory Visit (INDEPENDENT_AMBULATORY_CARE_PROVIDER_SITE_OTHER): Payer: BC Managed Care – PPO | Admitting: "Endocrinology

## 2016-10-25 ENCOUNTER — Encounter (INDEPENDENT_AMBULATORY_CARE_PROVIDER_SITE_OTHER): Payer: BC Managed Care – PPO | Admitting: *Deleted

## 2016-10-25 ENCOUNTER — Other Ambulatory Visit: Payer: Self-pay | Admitting: "Endocrinology

## 2016-10-25 VITALS — BP 114/72 | HR 78 | Temp 99.0°F | Wt 204.8 lb

## 2016-10-25 DIAGNOSIS — Z006 Encounter for examination for normal comparison and control in clinical research program: Secondary | ICD-10-CM

## 2016-10-25 LAB — CBC WITH DIFFERENTIAL/PLATELET
BASOS ABS: 0 {cells}/uL (ref 0–200)
Basophils Relative: 0 %
EOS ABS: 66 {cells}/uL (ref 15–500)
Eosinophils Relative: 1 %
HEMATOCRIT: 42.4 % (ref 38.5–50.0)
HEMOGLOBIN: 14 g/dL (ref 13.2–17.1)
LYMPHS ABS: 3036 {cells}/uL (ref 850–3900)
Lymphocytes Relative: 46 %
MCH: 30.8 pg (ref 27.0–33.0)
MCHC: 33 g/dL (ref 32.0–36.0)
MCV: 93.4 fL (ref 80.0–100.0)
MONO ABS: 528 {cells}/uL (ref 200–950)
MPV: 9.7 fL (ref 7.5–12.5)
Monocytes Relative: 8 %
NEUTROS PCT: 45 %
Neutro Abs: 2970 cells/uL (ref 1500–7800)
Platelets: 277 10*3/uL (ref 140–400)
RBC: 4.54 MIL/uL (ref 4.20–5.80)
RDW: 14.1 % (ref 11.0–15.0)
WBC: 6.6 10*3/uL (ref 3.8–10.8)

## 2016-10-26 ENCOUNTER — Encounter: Payer: Self-pay | Admitting: *Deleted

## 2016-10-26 LAB — COMPREHENSIVE METABOLIC PANEL
ALBUMIN: 4.4 g/dL (ref 3.6–5.1)
ALT: 21 U/L (ref 9–46)
AST: 22 U/L (ref 10–40)
Alkaline Phosphatase: 84 U/L (ref 40–115)
BUN: 12 mg/dL (ref 7–25)
CALCIUM: 9.7 mg/dL (ref 8.6–10.3)
CHLORIDE: 99 mmol/L (ref 98–110)
CO2: 28 mmol/L (ref 20–31)
CREATININE: 1.13 mg/dL (ref 0.60–1.35)
Glucose, Bld: 223 mg/dL — ABNORMAL HIGH (ref 65–99)
POTASSIUM: 4.7 mmol/L (ref 3.5–5.3)
SODIUM: 136 mmol/L (ref 135–146)
TOTAL PROTEIN: 7.2 g/dL (ref 6.1–8.1)
Total Bilirubin: 0.5 mg/dL (ref 0.2–1.2)

## 2016-10-26 LAB — LIPASE: Lipase: 21 U/L (ref 7–60)

## 2016-10-26 LAB — AMYLASE: Amylase: 215 U/L — ABNORMAL HIGH (ref 0–105)

## 2016-10-26 LAB — HIV ANTIBODY (ROUTINE TESTING W REFLEX): HIV: NONREACTIVE

## 2016-10-26 LAB — CK: Total CK: 91 U/L (ref 7–232)

## 2016-10-26 LAB — PHOSPHORUS: PHOSPHORUS: 3.8 mg/dL (ref 2.5–4.5)

## 2016-10-26 NOTE — Progress Notes (Signed)
Study: A Phase 2b/3 Double Blind Safety and Efficacy Study of Injectable Cabotegravir compared to Daily Oral Tenofovir Disoproxil Fumarate/Emtricitabine (TDF/FTC), For Pre-Exposure Prophylaxis in HIV-Uninfected Cisgender Men and Transgender Women who have sex with Men.  Medication: Investigational Injectable Cabotegravir/placebo compared to Truvada/placebo. Duration: Around 4 years.  [name] is here for week 25. He currently has no complaints. Was treated for flu syndrome in January. Vitals stable and blood drawn with no problems. HIV rapid was confirmed non-reactive. Questionnaires completed. #30 oral study product returned. #90 oral study product was dispensed. Injection given in right gluteal muscle with no problems. Condoms dispensed and received $50 gift card for visit. Next appointment scheduled for 2/12.

## 2016-10-27 ENCOUNTER — Ambulatory Visit (INDEPENDENT_AMBULATORY_CARE_PROVIDER_SITE_OTHER): Payer: BC Managed Care – PPO | Admitting: "Endocrinology

## 2016-10-27 ENCOUNTER — Encounter (INDEPENDENT_AMBULATORY_CARE_PROVIDER_SITE_OTHER): Payer: Self-pay | Admitting: "Endocrinology

## 2016-10-27 VITALS — BP 118/70 | HR 78 | Ht 71.65 in | Wt 201.0 lb

## 2016-10-27 DIAGNOSIS — E049 Nontoxic goiter, unspecified: Secondary | ICD-10-CM

## 2016-10-27 DIAGNOSIS — I10 Essential (primary) hypertension: Secondary | ICD-10-CM

## 2016-10-27 DIAGNOSIS — E11649 Type 2 diabetes mellitus with hypoglycemia without coma: Secondary | ICD-10-CM

## 2016-10-27 DIAGNOSIS — E063 Autoimmune thyroiditis: Secondary | ICD-10-CM | POA: Diagnosis not present

## 2016-10-27 DIAGNOSIS — G609 Hereditary and idiopathic neuropathy, unspecified: Secondary | ICD-10-CM

## 2016-10-27 DIAGNOSIS — IMO0001 Reserved for inherently not codable concepts without codable children: Secondary | ICD-10-CM

## 2016-10-27 DIAGNOSIS — E1065 Type 1 diabetes mellitus with hyperglycemia: Secondary | ICD-10-CM | POA: Diagnosis not present

## 2016-10-27 DIAGNOSIS — E1043 Type 1 diabetes mellitus with diabetic autonomic (poly)neuropathy: Secondary | ICD-10-CM

## 2016-10-27 LAB — POCT GLYCOSYLATED HEMOGLOBIN (HGB A1C): HEMOGLOBIN A1C: 8.6

## 2016-10-27 LAB — GLUCOSE, POCT (MANUAL RESULT ENTRY): POC GLUCOSE: 174 mg/dL — AB (ref 70–99)

## 2016-10-27 NOTE — Patient Instructions (Signed)
Follow up in 3 months

## 2016-10-27 NOTE — Progress Notes (Signed)
CC: FU T1DM, hypoglycemia, hypertension, hyperlipidemia, autonomic neuropathy, tachycardia, gastroparesis, peripheral neuropathy, thyroiditis, goiter, hepatitis B, fatigue  HPI: [name] is a 46 y.o. Caucasian gentleman. He was unaccompanied.   1. Mr. Chew was diagnosed with T2DM about 2002-2003. He was treated with metformin, Avandia, and Amaryl, but BGs were not well controlled, so Lantus was begun in 2005. When it became apparent that he would need a multiple daily injection regimen with Lantus and Novolog, he was referred to me by [name], RN, CDE, of the [facility]. I saw him for the first time on 02/12/05. He had just been started on Novolog aspart insulin at meals (120/25/10 plan). His PMH included recurrent hypoglycemia, hypertension, dyslipidemia, and proteinuria. He was also allergic to penicillin. He had had oral surgery in the past, but no other surgery. His psychiatric diagnoses included depression, anxiety, and dissociative identity disorder. Social history included the fact that he was gay. All HIV tests had been negative through that time. He had recently been laid off and had no health insurance. In March 2006, his HbA1c had been 10.6%. On exam he had a goiter and mild peripheral neuropathy. 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 We accepted him as a charity patient and enrolled him in our Diabetes Survival Skills Program. He was converted to a Medtronic 508 insulin pump in August 2006.   2. During the next twelve  years his DM self-care efforts and HbA1c values waxed and waned, with A1c values ranging from 6.4%-9.2%. His worst BGs occurred when he was being treated with interferon for hepatitis B, from which he is now in remission. He has been gainfully employed at [employer], [employer], and again at [employer] for several years and has had health insurance. In 2011 we changed him to a Medtronic 723 (Revel) pump and  started him on the Medtronic CGM sensor. He is now on a Medtronic 530G insulin pump. Since the introduction of CGM-pump therapy, his BGs have significantly improved when he has been consistent with his T1DM self-care.    3. His last [facility] visit was on 07/16/16. At that visit I increased his basal rates, changes that helped. In the interim he has been healthy, but he had the flu about one month ago. He has been trying to eat better and is using the gym at his new housing complex more frequently. He will soon begin walking to work daily for 1.5 miles each way. He has lost 2 pounds since his last visit. He has been changing sites about every 3-5 days. He uses Novolog aspart in his Medtronic insulin pump. He wears the Medtronic Enlite sensor much of the time. He has been taking enalapril, 10 mg once daily, 10 mg of atorvastatin once daily, Centrum Silver, and two 81 mg ASA per day. He discontinued his Adderal for adult ADD due to some "really bad psychological reactions". He no longer takes [place]. His work situation and marriage are doing well.  He is not traveling for work much now.  At his last visit at [facility] in Dec 15, 2017he was pronounced cured and released from further follow up care.  4. Pertinent Review of Systems: Constitutional: The patient feels "really good, but stressed as hell at work". He sometimes gets depressed and anxious if things are too stressful at work. He is much more aware when his BGs trend downward now. He has few physical complaints. Eyes: Vision is pretty  good. His last eye exam was in June 2017. There were no signs of diabetic eye disease. He has presbyopia, but no other problems.  Neck:  He has not had any discomfort in his anterior neck since his last visit.  Heart: He occasionally notes faster heart rate when he is drinking more caffeine, which he usually does when he feels stressed. Heart rate increases with exercise or other physical activity. He has not  had any chest wall pains. He has no other complaints of irregular heat beats, chest pain, or chest pressure. Gastrointestinal: His reflux has not been much of an issue for him recently. He rarely has constipation if he doesn't drink enough fluid and take enough fiber. He does have occasional abdominal bloating, usually after eating a lot of fresh vegetables. Hands: His hand rash has not recurred while using Lamisil or Lotrimin.  Legs: Muscle mass and strength seem normal. There are no complaints of numbness, tingling, burning, or pain. No edema is noted.  Feet:  He continues to apply Lamisil or Lotrimin cream as a preventive measure about twice a week. There are no complaints of numbness, tingling, burning, or pain. No edema is noted. Hypoglycemia: He has had some low BGs.   5 . BG printout: He changes sites every 4-5  days. He checks BGs 1-10 times per day. The longer his sites remain in place, the higher the BGs tend to be. His BG results vary based upon the number of BG checks he does, how often he uses his sensor, the intensity and duration of exercise, whether or not he is sick, whether he boluses at meals, and the tendency of his sites to go bad with prolonged use. Some sites work better than others. He boluses from 2-9 times per day. He still often gives correction boluses based upon his CGM value rather than checking BGs. He also sometimes just gives manual boluses. His average BG is 210, compared with 249 at last visit and with 225 at the prior visit.   6. CGM sensor readout: Overall his sensor values and BG values correlate quite well. The sensor glucose values vary substantially hour to hour and day to day. He has had 5 "Threshold suspends", sometimes due to physical activity. In general the BGs are higher from 2-7 AM, but then average about 180 during the rest of the time. His lower BGs occur when the average BGs are lower, especially from 1-11 AM. His sensor values reflect his highly variable  meal schedule, carb intakes, activity levels, bolusing, and stress levels.   PAST MEDICAL, FAMILY, AND SOCIAL HISTORY:  1. 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 [place]  2. He has been walking more.     3. Primary care provider: Dr. Sharlot GowdaJohn Lalonde  REVIEW OF SYSTEMS: Mr. Carrie MewCollis does not have any significant problems involving his other body systems.  PHYSICAL EXAM: BP 118/70   Pulse 78   Ht 5' 11.65" (1.82 m)   Wt 201 lb (91.2 kg)   BMI 27.52 kg/m     Constitutional: [name] looks healthy and a bit slimmer. He has lost 2 pounds since his last visit. He appears physically and emotionally well. His affect, engagement, and insight are normal.  Eyes: There is no arcus or proptosis.  Mouth: The oropharynx appears normal. The tongue appears normal. There is normal oral moisture. There is no obvious gingivitis. Neck: There  are no bruits present. The thyroid gland appears slightly enlarged. The thyroid gland is a bit larger at 21-22 grams in size. The right lobe is slightly enlarged. The left lobe is larger. The consistency of the thyroid gland is normal. The thyroid gland is tender to palpation in the left mid-lobe today.    Lungs: The lungs are clear. Air movement is good. Heart: The heart rhythm and rate appear normal. Heart sounds S1 and S2 are normal. I do not appreciate any pathologic heart murmurs. Abdomen: The abdomen is enlarged, but smaller. Bowel sounds are normal. The abdomen is soft and non-tender. There is no obviously palpable hepatomegaly, splenomegaly, or other masses.  Arms: Muscle mass appears appropriate for age.  Hands: There is no obvious tremor. Phalangeal and metacarpophalangeal joints appear normal. His rash has resolved.  Legs: Muscle mass appears appropriate for age. There is no edema.  Feet: There are no significant deformities. Dorsalis pedis pulses are 2+ bilaterally. His feet  look good. He has no obvious tinea pedis of the feet today.  Neurologic: Muscle strength is normal for age and gender in both the upper and the lower extremities. Muscle tone appears normal. Sensation to touch is normal in the legs, but slightly decreased in the heels today.    LABS:   Labs 10/27/16: HbA1c 8.6%,CBG 174  Labs 10/25/16: CMP normal, except for glucose 223; HIV antibody non-reactive  Labs 07/16/16: HbA1c 8.6%   Labs 06/25/16: Surveillance labs for hepatology: HIV negative; Amylase 155 (ref 0-105), lipase 34 (ref 7-60); CBC normal; CK 71 (ref 7-232); CMP normal except glucose 293; phosphorus 2.6 (ref 2.5-4.5)  Labs 03/31/16: HbA1c 8.4%; TSH 1.07, free T4 1.1, free T3 3.4; cholesterol 162, triglycerides 49, HDL 86, LDL 66; urinary microalbumin/creatinine ratio 1; CMP normal, except for glucose 173;   Labs 12/30/15: HbA1c 8.1%  Labs 10/01/15: HbA1c 7.8%  Labs 11/18/14; HbA1c 8.9%, compared with 7.7% at last visit and with 7.0% at the visit prior. CMP normal except for glucose of 173, normal AST and ALT; cholesterol 161, triglycerides 8, HDL 67, LDL 78; urinary microalbumin/creatinine ratio 2.5; TSH 1.357, free T4 1.04, free T3 3.0  Labs 12/06/12: CMP with normal AST and ALT. Cholesterol 136, triglycerides 74, HDL 47 (decreased from 58 when he was exercising), LDL 74; microalbumin/creatinine ratio 2.3; TSH 1.782, free T4 0.98, free T3 3.1  Labs 02/03/12: CMP: Normal; TSH 1.565, free T4 0.98, free T3 3.0; cholesterol 156, triglycerides 125, HDL 58, LDL 73; Urinary microalbumin/creatinine ratio 4.2.  ASSESSMENT:  1. T1DM: His BG control is slightly better in terms of his average BG in the past month, but not changed in terms of HbA1c since his last visit. He is still not changing his sites often enough. There are still many glucose spikes that are probably diet-related. He needs to be more consistent with his diet and his exercise regimen. It is clear that the more often he checks BGs and  take both correction boluses and food boluses, the better his BGs can be.  2. Hypoglycemia: He is not having as many low BGs. His significant low BGs usually trigger the Threshold Suspend function, so his BGs do not usually drop too low. Some of his low BGs are due to physical activity. 3. Thyroiditis: His thyroiditis is clinically active today. 4. Goiter: The thyroid gland has increased in size since his last visit. The waxing and waning of thyroid gland size is c/w evolving Hashimoto's disease.  He was euthyroid in March 2014, February  2016, and July 2017.   5. Hypertension: His BP is good today. He needs to continue to exercise and to lose fat  weight.  6. Hyperlipidemia: Lipid panel results in March 2014, February 2016, and July 2017 were good overall.  7. Autonomic neuropathy, tachycardia, and gastroparesis: With the worsening of BG control he had had, his neuropathy, tachycardia, and gastroparesis worsened in parallel. He is doing better now.  8. Peripheral neuropathy: His neuropathy is mild today.   9. Adjustment reaction: He is doing very well today.  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 His LFTs and total bilirubin are quite normal. I asked Trey Paula to talk with his hepatologist about this issue. The hepatologist thinks that these values are related to his diabetes. That is not an issue that I recognize.   PLAN:  1. Diagnostic: HbA1c and CBG today. Call Dr. Fransico Torrence Branagan if the BGs decrease with more exercise in the Spring.  2. Therapeutic:   A. Take better care of your T1DM. Try to change pump sites every three days. Use Lamisil or other anti-fungal agent for tinea pedis. Continue enalapril and atorvastatin. Eat right. Exercise right. Don't try too hard to avoid hypoglycemia. Be prepared to reduce basal rates and/or  to use temporary basal rates when exercising or eating late. Please subtract 50-100-150 points of BG after exercise. Be prepared to use a lower TBR during and after exercise.   B. Continue current bolus settings. Use TBRs for exercise. New basal rates: MN: 0.900 -> 0.925 3 AM: 1.35 -> 1.45 4:30 AM: 1.40 -> 1.50 8 AM: 1.25 12:00 noon: 0.90 5 PM: 0.825 7 PM: 1.20 3. Patient education: We discussed life style changes at length. He needs to stay on track consistently.  He wants a letter to convert to a 670G pump. 4. Follow up: FU appointment in 3 months.  Level of Service: This visit lasted in excess of 45 minutes. More than 50% of the visit was devoted to counseling.  Molli Knock. MD, CDE Adult and Pediatric Endocrinology

## 2016-11-01 ENCOUNTER — Encounter (INDEPENDENT_AMBULATORY_CARE_PROVIDER_SITE_OTHER): Payer: BC Managed Care – PPO | Admitting: *Deleted

## 2016-11-01 VITALS — BP 116/77 | HR 91 | Temp 98.1°F | Wt 205.5 lb

## 2016-11-01 DIAGNOSIS — Z006 Encounter for examination for normal comparison and control in clinical research program: Secondary | ICD-10-CM

## 2016-11-01 LAB — COMPREHENSIVE METABOLIC PANEL
ALT: 21 U/L (ref 9–46)
AST: 23 U/L (ref 10–40)
Albumin: 4.3 g/dL (ref 3.6–5.1)
Alkaline Phosphatase: 72 U/L (ref 40–115)
BUN: 9 mg/dL (ref 7–25)
CO2: 27 mmol/L (ref 20–31)
Calcium: 9.6 mg/dL (ref 8.6–10.3)
Chloride: 100 mmol/L (ref 98–110)
Creat: 1.22 mg/dL (ref 0.60–1.35)
GLUCOSE: 197 mg/dL — AB (ref 65–99)
POTASSIUM: 4.4 mmol/L (ref 3.5–5.3)
SODIUM: 137 mmol/L (ref 135–146)
Total Bilirubin: 0.5 mg/dL (ref 0.2–1.2)
Total Protein: 7.1 g/dL (ref 6.1–8.1)

## 2016-11-01 LAB — CBC WITH DIFFERENTIAL/PLATELET
BASOS PCT: 0 %
Basophils Absolute: 0 cells/uL (ref 0–200)
EOS PCT: 0 %
Eosinophils Absolute: 0 cells/uL — ABNORMAL LOW (ref 15–500)
HCT: 42.3 % (ref 38.5–50.0)
Hemoglobin: 13.8 g/dL (ref 13.2–17.1)
LYMPHS PCT: 41 %
Lymphs Abs: 2542 cells/uL (ref 850–3900)
MCH: 30.5 pg (ref 27.0–33.0)
MCHC: 32.6 g/dL (ref 32.0–36.0)
MCV: 93.4 fL (ref 80.0–100.0)
MONOS PCT: 5 %
MPV: 10.5 fL (ref 7.5–12.5)
Monocytes Absolute: 310 cells/uL (ref 200–950)
Neutro Abs: 3348 cells/uL (ref 1500–7800)
Neutrophils Relative %: 54 %
PLATELETS: 208 10*3/uL (ref 140–400)
RBC: 4.53 MIL/uL (ref 4.20–5.80)
RDW: 14.3 % (ref 11.0–15.0)
WBC: 6.2 10*3/uL (ref 3.8–10.8)

## 2016-11-01 LAB — PHOSPHORUS: Phosphorus: 3.7 mg/dL (ref 2.5–4.5)

## 2016-11-01 NOTE — Progress Notes (Signed)
Study: A Phase 2b/3 Double Blind Safety and Efficacy Study of Injectable Cabotegravir compared to Daily Oral Tenofovir Disoproxil Fumarate/Emtricitabine (TDF/FTC), For Pre-Exposure Prophylaxis in HIV-Uninfected Cisgender Men and Transgender Women who have sex with Men.  Medication: Investigational Injectable Cabotegravir/placebo compared to Truvada/placebo. Duration: Around 4 years.  Samuel Valentine is here for week 27. He reports redness at injection site the following day and lasting a day. No other issues/problems to reports. HIV rapid confirmed negative. H received $50 gift card for visit and will see him 3/19 for next injection. Tacey HeapElisha Epperson RN

## 2016-11-02 LAB — LIPASE: Lipase: 20 U/L (ref 7–60)

## 2016-11-02 LAB — AMYLASE: Amylase: 201 U/L — ABNORMAL HIGH (ref 0–105)

## 2016-11-02 LAB — HIV ANTIBODY (ROUTINE TESTING W REFLEX): HIV: NONREACTIVE

## 2016-11-02 LAB — CK: Total CK: 118 U/L (ref 7–232)

## 2016-11-23 ENCOUNTER — Encounter (INDEPENDENT_AMBULATORY_CARE_PROVIDER_SITE_OTHER): Payer: Self-pay | Admitting: *Deleted

## 2016-11-26 ENCOUNTER — Other Ambulatory Visit: Payer: Self-pay | Admitting: "Endocrinology

## 2016-12-06 ENCOUNTER — Encounter (INDEPENDENT_AMBULATORY_CARE_PROVIDER_SITE_OTHER): Payer: BC Managed Care – PPO | Admitting: *Deleted

## 2016-12-06 VITALS — BP 105/69 | HR 102 | Temp 98.5°F | Wt 204.0 lb

## 2016-12-06 DIAGNOSIS — Z006 Encounter for examination for normal comparison and control in clinical research program: Secondary | ICD-10-CM

## 2016-12-06 LAB — COMPREHENSIVE METABOLIC PANEL
ALBUMIN: 4.6 g/dL (ref 3.6–5.1)
ALK PHOS: 72 U/L (ref 40–115)
ALT: 22 U/L (ref 9–46)
AST: 23 U/L (ref 10–40)
BILIRUBIN TOTAL: 0.6 mg/dL (ref 0.2–1.2)
BUN: 12 mg/dL (ref 7–25)
CALCIUM: 9.8 mg/dL (ref 8.6–10.3)
CO2: 28 mmol/L (ref 20–31)
Chloride: 99 mmol/L (ref 98–110)
Creat: 1.12 mg/dL (ref 0.60–1.35)
GLUCOSE: 277 mg/dL — AB (ref 65–99)
Potassium: 4.6 mmol/L (ref 3.5–5.3)
Sodium: 136 mmol/L (ref 135–146)
Total Protein: 7.4 g/dL (ref 6.1–8.1)

## 2016-12-06 LAB — CBC WITH DIFFERENTIAL/PLATELET
BASOS ABS: 0 {cells}/uL (ref 0–200)
BASOS PCT: 0 %
EOS ABS: 0 {cells}/uL — AB (ref 15–500)
Eosinophils Relative: 0 %
HEMATOCRIT: 44 % (ref 38.5–50.0)
Hemoglobin: 14.3 g/dL (ref 13.2–17.1)
LYMPHS PCT: 38 %
Lymphs Abs: 2546 cells/uL (ref 850–3900)
MCH: 30.7 pg (ref 27.0–33.0)
MCHC: 32.5 g/dL (ref 32.0–36.0)
MCV: 94.4 fL (ref 80.0–100.0)
MONO ABS: 402 {cells}/uL (ref 200–950)
MONOS PCT: 6 %
MPV: 9.9 fL (ref 7.5–12.5)
NEUTROS ABS: 3752 {cells}/uL (ref 1500–7800)
Neutrophils Relative %: 56 %
PLATELETS: 218 10*3/uL (ref 140–400)
RBC: 4.66 MIL/uL (ref 4.20–5.80)
RDW: 14.3 % (ref 11.0–15.0)
WBC: 6.7 10*3/uL (ref 3.8–10.8)

## 2016-12-06 LAB — PHOSPHORUS: PHOSPHORUS: 3.7 mg/dL (ref 2.5–4.5)

## 2016-12-06 NOTE — Progress Notes (Signed)
Study: A Phase 2b/3 Double Blind Safety and Efficacy Study of Injectable Cabotegravir compared to Daily Oral Tenofovir Disoproxil Fumarate/Emtricitabine (TDF/FTC), For Pre-Exposure Prophylaxis in HIV-Uninfected Cisgender Men and Transgender Women who have sex with Men.  Medication: Investigational Injectable Cabotegravir/placebo compared to Truvada/placebo. Duration: Around 4 years.  [name] is here for week 33. He denies any new issues. Is excited to get his new insulin pump tomorrow. Blood drawn and HIV rapid is non-reactive. Questionnaires completed. He returned #48 pills. Injection given in right gluteal muscle with no problems. Oral study meds dispensed. He received $50 gift card and condoms. Next appointment is 3/26 @ 4pm.

## 2016-12-07 LAB — GC/CHLAMYDIA PROBE AMP
CT Probe RNA: NOT DETECTED
GC Probe RNA: NOT DETECTED

## 2016-12-07 LAB — LIPASE: LIPASE: 20 U/L (ref 7–60)

## 2016-12-07 LAB — RPR

## 2016-12-07 LAB — AMYLASE: Amylase: 183 U/L — ABNORMAL HIGH (ref 0–105)

## 2016-12-07 LAB — CK: Total CK: 88 U/L (ref 7–232)

## 2016-12-07 LAB — HIV ANTIBODY (ROUTINE TESTING W REFLEX): HIV: NONREACTIVE

## 2016-12-09 ENCOUNTER — Ambulatory Visit (INDEPENDENT_AMBULATORY_CARE_PROVIDER_SITE_OTHER): Payer: BC Managed Care – PPO | Admitting: Family Medicine

## 2016-12-09 ENCOUNTER — Encounter: Payer: Self-pay | Admitting: Family Medicine

## 2016-12-09 VITALS — BP 104/64 | HR 64 | Temp 98.2°F | Wt 210.6 lb

## 2016-12-09 DIAGNOSIS — A549 Gonococcal infection, unspecified: Secondary | ICD-10-CM | POA: Diagnosis not present

## 2016-12-09 LAB — CT/NG RNA, TMA RECTAL
Chlamydia Trachomatis RNA: NOT DETECTED
Neisseria Gonorrhoeae RNA: DETECTED — AB

## 2016-12-09 MED ORDER — CEFTRIAXONE SODIUM 250 MG IJ SOLR
250.0000 mg | Freq: Once | INTRAMUSCULAR | Status: AC
  Administered 2016-12-09: 250 mg via INTRAMUSCULAR

## 2016-12-09 MED ORDER — AZITHROMYCIN 250 MG PO TABS: ORAL_TABLET | ORAL | 0 refills | Status: DC

## 2016-12-09 NOTE — Progress Notes (Signed)
   Subjective:    Patient ID: Samuel DamesJeffrey Valentine, male    DOB: [birth year], 46 y.o.   MRN: [id number]  HPI Chief Complaint  Patient presents with  . positive std    positive Gonorrhea   He is here due to having a positive gonorrhea test at another office and needing treatment. He was positive via rectal swab and negative for other STDs including being negative for chlamydia in his urine. States he is not sure how he got this rectally because he is a "top". He reports having multiple male partners.  States he is not having any symptoms but is concerned that he may have anal warts. States he has had an issue with this in the past. Would like to have it checked.   Denies fever, chills, abdominal pain, N/V/D, or urinary issues.    Review of Systems Pertinent positives and negatives in the history of present illness.     Objective:   Physical Exam BP 104/64   Pulse 64   Temp 98.2 F (36.8 C) (Oral)   Wt 210 lb 9.6 oz (95.5 kg)   BMI 28.84 kg/m   Alert and oriented and in no acute distress. Small flesh colored discreet bumps to the 11 and 12 o'clock position of the anal area.       Assessment & Plan:  Gonorrhea in male - Plan: azithromycin (ZITHROMAX) 250 MG tablet, cefTRIAXone (ROCEPHIN) injection 250 mg  Treated patient with  250 mg of Rocephin IM in office and sent order for 1g Azithromycin to pharmacy. Discussed that his partners need to be contacted and treated and recommend he refrain from sexual activity for at least 7 days and until all partners are treated as well. Discussed using condoms to protect against future STDs. Exam is suspicious for anal warts.  He will follow up in 1 month with Dr. Susann GivensLalonde for treatment of cure (anal swab) and for possible anal warts procedure.

## 2016-12-09 NOTE — Patient Instructions (Signed)
Return in 1 month for a follow up test for treatment of cure.

## 2016-12-13 ENCOUNTER — Encounter (INDEPENDENT_AMBULATORY_CARE_PROVIDER_SITE_OTHER): Payer: BC Managed Care – PPO | Admitting: *Deleted

## 2016-12-13 VITALS — BP 122/76 | HR 76 | Temp 98.2°F | Wt 209.5 lb

## 2016-12-13 DIAGNOSIS — Z006 Encounter for examination for normal comparison and control in clinical research program: Secondary | ICD-10-CM

## 2016-12-13 LAB — COMPREHENSIVE METABOLIC PANEL
ALK PHOS: 76 U/L (ref 40–115)
ALT: 22 U/L (ref 9–46)
AST: 23 U/L (ref 10–40)
Albumin: 4.2 g/dL (ref 3.6–5.1)
BILIRUBIN TOTAL: 0.4 mg/dL (ref 0.2–1.2)
BUN: 9 mg/dL (ref 7–25)
CALCIUM: 9.8 mg/dL (ref 8.6–10.3)
CO2: 29 mmol/L (ref 20–31)
Chloride: 100 mmol/L (ref 98–110)
Creat: 1.04 mg/dL (ref 0.60–1.35)
GLUCOSE: 132 mg/dL — AB (ref 65–99)
Potassium: 4.4 mmol/L (ref 3.5–5.3)
Sodium: 140 mmol/L (ref 135–146)
TOTAL PROTEIN: 7 g/dL (ref 6.1–8.1)

## 2016-12-13 LAB — CBC WITH DIFFERENTIAL/PLATELET
BASOS ABS: 0 {cells}/uL (ref 0–200)
Basophils Relative: 0 %
EOS ABS: 0 {cells}/uL — AB (ref 15–500)
Eosinophils Relative: 0 %
HCT: 41.2 % (ref 38.5–50.0)
Hemoglobin: 13.8 g/dL (ref 13.2–17.1)
LYMPHS PCT: 52 %
Lymphs Abs: 3432 cells/uL (ref 850–3900)
MCH: 31.1 pg (ref 27.0–33.0)
MCHC: 33.5 g/dL (ref 32.0–36.0)
MCV: 92.8 fL (ref 80.0–100.0)
MPV: 9.8 fL (ref 7.5–12.5)
Monocytes Absolute: 330 cells/uL (ref 200–950)
Monocytes Relative: 5 %
Neutro Abs: 2838 cells/uL (ref 1500–7800)
Neutrophils Relative %: 43 %
PLATELETS: 220 10*3/uL (ref 140–400)
RBC: 4.44 MIL/uL (ref 4.20–5.80)
RDW: 14.3 % (ref 11.0–15.0)
WBC: 6.6 10*3/uL (ref 3.8–10.8)

## 2016-12-13 LAB — PHOSPHORUS: Phosphorus: 3.9 mg/dL (ref 2.5–4.5)

## 2016-12-14 LAB — HIV ANTIBODY (ROUTINE TESTING W REFLEX): HIV 1&2 Ab, 4th Generation: NONREACTIVE

## 2016-12-14 LAB — CK: Total CK: 113 U/L (ref 7–232)

## 2016-12-14 LAB — LIPASE: Lipase: 14 U/L (ref 7–60)

## 2016-12-14 LAB — AMYLASE: Amylase: 202 U/L — ABNORMAL HIGH (ref 0–105)

## 2016-12-14 NOTE — Progress Notes (Signed)
Samuel Valentine was here for his week 35 visit for Study: A Phase 2b/3 Double Blind Safety and Efficacy Study of Injectable Cabotegravir compared to Daily Oral Tenofovir Disoproxil Fumarate/Emtricitabine (TDF/FTC), For Pre-Exposure Prophylaxis in HIV-Uninfected Cisgender Men and Transgender Women who have sex with Men.  Medication: Investigational Injectable Cabotegravir/placebo compared to Truvada/placebo. Duration: Around 4 years.  He denies any problem from his last injection of study medication or any new problems, though I did see where he had been treated for gonorrhea since the last visit where he tested positive. He has a new insulin pump he is excited about which should help him keep his glucose more controlled. He will return in may for the next visit.

## 2016-12-24 ENCOUNTER — Other Ambulatory Visit: Payer: Self-pay | Admitting: "Endocrinology

## 2017-01-06 ENCOUNTER — Ambulatory Visit: Payer: BC Managed Care – PPO | Admitting: Family Medicine

## 2017-01-06 ENCOUNTER — Ambulatory Visit (INDEPENDENT_AMBULATORY_CARE_PROVIDER_SITE_OTHER): Payer: BC Managed Care – PPO | Admitting: Family Medicine

## 2017-01-06 ENCOUNTER — Encounter: Payer: Self-pay | Admitting: Family Medicine

## 2017-01-06 VITALS — BP 112/80 | HR 80 | Ht 72.0 in | Wt 205.0 lb

## 2017-01-06 DIAGNOSIS — A630 Anogenital (venereal) warts: Secondary | ICD-10-CM

## 2017-01-06 DIAGNOSIS — Z8619 Personal history of other infectious and parasitic diseases: Secondary | ICD-10-CM | POA: Diagnosis not present

## 2017-01-06 NOTE — Progress Notes (Signed)
   Subjective:    Patient ID: Samuel Valentine, male    DOB: May 05, 1971, 46 y.o.   MRN: [id number]  HPI He is here for a recheck. He was treated recently for gonorrhea that was obtained as part of a HIV study that he is participating in. He is on prep and continues to remain HIV negative. On his last visit warts were noted in the perianal area. Presently he is having no discharge, frequency, dysuria.   Review of Systems     Objective:   Physical Exam  Anal area looks normal. Distal to this in the anal cleft wartlike lesions were noted.       Assessment & Plan:  History of gonorrhea - Plan: GC/Chlamydia Probe Amp  Perianal venereal warts I will do follow-up GC chlamydia although he was treated appropriately. The lesions were injected with Xylocaine and hyfrecated without difficulty. He tolerated the procedure well.

## 2017-01-07 LAB — GC/CHLAMYDIA PROBE AMP
CT PROBE, AMP APTIMA: NOT DETECTED
GC PROBE AMP APTIMA: NOT DETECTED

## 2017-01-24 ENCOUNTER — Ambulatory Visit (INDEPENDENT_AMBULATORY_CARE_PROVIDER_SITE_OTHER): Payer: BC Managed Care – PPO | Admitting: "Endocrinology

## 2017-01-24 ENCOUNTER — Encounter (INDEPENDENT_AMBULATORY_CARE_PROVIDER_SITE_OTHER): Payer: Self-pay | Admitting: "Endocrinology

## 2017-01-24 VITALS — BP 122/70 | HR 88 | Wt 205.0 lb

## 2017-01-24 DIAGNOSIS — E782 Mixed hyperlipidemia: Secondary | ICD-10-CM | POA: Diagnosis not present

## 2017-01-24 DIAGNOSIS — E1065 Type 1 diabetes mellitus with hyperglycemia: Secondary | ICD-10-CM

## 2017-01-24 DIAGNOSIS — E049 Nontoxic goiter, unspecified: Secondary | ICD-10-CM

## 2017-01-24 DIAGNOSIS — E10649 Type 1 diabetes mellitus with hypoglycemia without coma: Secondary | ICD-10-CM | POA: Diagnosis not present

## 2017-01-24 DIAGNOSIS — E063 Autoimmune thyroiditis: Secondary | ICD-10-CM

## 2017-01-24 DIAGNOSIS — IMO0001 Reserved for inherently not codable concepts without codable children: Secondary | ICD-10-CM

## 2017-01-24 DIAGNOSIS — I10 Essential (primary) hypertension: Secondary | ICD-10-CM | POA: Diagnosis not present

## 2017-01-24 DIAGNOSIS — R748 Abnormal levels of other serum enzymes: Secondary | ICD-10-CM | POA: Diagnosis not present

## 2017-01-24 DIAGNOSIS — E1043 Type 1 diabetes mellitus with diabetic autonomic (poly)neuropathy: Secondary | ICD-10-CM | POA: Diagnosis not present

## 2017-01-24 DIAGNOSIS — E1042 Type 1 diabetes mellitus with diabetic polyneuropathy: Secondary | ICD-10-CM

## 2017-01-24 DIAGNOSIS — R000 Tachycardia, unspecified: Secondary | ICD-10-CM | POA: Diagnosis not present

## 2017-01-24 LAB — POCT GLUCOSE (DEVICE FOR HOME USE): POC GLUCOSE: 99 mg/dL (ref 70–99)

## 2017-01-24 LAB — POCT GLYCOSYLATED HEMOGLOBIN (HGB A1C): HEMOGLOBIN A1C: 6.7

## 2017-01-24 NOTE — Progress Notes (Signed)
CC: FU T1DM, hypoglycemia, hypertension, hyperlipidemia, autonomic neuropathy, tachycardia, gastroparesis, peripheral neuropathy, thyroiditis, goiter, hepatitis B, fatigue  HPI: Samuel Valentine is a 46 y.o. Caucasian gentleman. He was unaccompanied.   1. Samuel Valentine was diagnosed with T2DM about 2002-2003. He was treated with metformin, Avandia, and Amaryl, but BGs were not well controlled, so Lantus was begun in 2005. When it became apparent that he would need a multiple daily injection regimen with Lantus and Novolog, he was referred to me by [name], RN, CDE, of the [facility]. I saw him for the first time on 02/12/05. He had just been started on Novolog aspart insulin at meals (120/25/10 plan). His PMH included recurrent hypoglycemia, hypertension, dyslipidemia, and proteinuria. He was also allergic to penicillin. He had had oral surgery in the past, but no other surgery. His psychiatric diagnoses included depression, anxiety, and dissociative identity disorder. Social history included the fact that he was gay. All HIV tests had been negative through that time. He had recently been laid off and had no health insurance. In March 2006, his HbA1c had been 10.6%. On exam he had a goiter and mild peripheral neuropathy. 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 We accepted him as a charity patient and enrolled him in our Diabetes Survival Skills Program. He was converted to a Medtronic 508 insulin pump in August 2006.   2. During the next twelve  years his DM self-care efforts and HbA1c values waxed and waned, with A1c values ranging from 6.4%-9.2%. His worst BGs occurred when he was being treated with interferon for hepatitis B, from which he is now in remission. He has been gainfully employed at [employer], [employer], and again at [employer] for several years and has had health insurance. In 2011 we changed him to a Medtronic 723 (Revel) pump and  started him on the Medtronic CGM sensor. He later converted to a Medtronic 530G insulin pump. Since the introduction of CGM-pump therapy, his BGs have significantly improved when he has been consistent with his T1DM self-care.    3. His last [facility] visit was on 10/27/16. At that visit I increased his basal rates again.   A. In the interim he has been healthy. He has more allergy symptoms this week.  B. He put himself on his new Medtronic 670G pump and Guardian 3 sensor in on 12/07/16. He started on automode on 12/10/16. The new sensor-pump system has caused him to have much better BG control. He is very happy with the new system. Unfortunately, although he did not have many hypoglycemic symptoms, we discovered that he has been having hypoglycemia almost every day.    C. He "can exercise like never before". He now walks 1.6 miles twice daily to and from work. He also uses his elliptical machine 3-4 times per week. He has been trying to eat better and is using the gym at his new housing complex more frequently.   D. He has gained 4 pounds since his last visit. He has had more appetite in the morning than he has had in years.   E. He has been taking enalapril, 10 mg once daily, 10 mg of atorvastatin once daily, Centrum Silver, and two 81 mg ASA per day.  F. His work situation and marriage are doing well.  He is not traveling for work much now.  At his last visit at [facility] in 12-11-2017he was pronounced cured and  released from further follow up care.  4. Pertinent Review of Systems: Constitutional: The patient feels "pretty good". He feels very normal. He has not had as many issues with depression and anxiety, although work is still stressful. He is much more aware when his BGs trend downward now. He has few physical complaints. Eyes: Vision is pretty good. His last eye exam was in June 2017. There were no signs of diabetic eye disease. He has presbyopia, but no other problems. He will  schedule a follow up exam soon.  Neck:  He has not had any discomfort in his anterior neck since his last visit.  Heart: He occasionally notes faster heart rate when he is drinking more caffeine, which he usually does when he feels stressed. Heart rate increases with exercise or other physical activity. He has not had any chest wall pains. He has no other complaints of irregular heat beats, chest pain, or chest pressure. Gastrointestinal: His reflux has not been much of an issue. He rarely has constipation if he doesn't drink enough fluid and take enough fiber. He does have occasional abdominal bloating, usually after eating a lot of fresh vegetables. Hands: His hand rash has not recurred while using Lamisil or Lotrimin.  Legs: Muscle mass and strength seem normal. There are no complaints of numbness, tingling, burning, or pain. No edema is noted.  Feet:  He continues to apply Lamisil or Lotrimin cream as a preventive measure about twice a week. There are no complaints of numbness, tingling, burning, or pain. No edema is noted. Hypoglycemia: He did not think that he was having many low BGs.   5 . BG printout: We only have data since 01/10/17. He changes sites every 4 days. He checks BGs 3-5 days, mostly 4 times. The longer his sites remain in place, the higher the BGs tend to be. His BGs have been very "hectic", sawtooth, frequent peak and trough fashion. All five of the BGs >300 occurred late on the third day or on the fourth day of sites. He has had 15 BGs <70, many of which occurred after correcting higher BGs. Some sites work better than others. His average BG is 187, compared with 210 at last visit and with 249 at the prior visit. His BG range is 55 to >400.  6. CGM sensor readout: Overall his sensor values and BG values correlate quite well. The sensor has identified 15 BGs <70, only one of which was identified by BG checks. His sensor values reflect his highly variable meal schedule, carb intakes,  activity levels, bolusing, and stress levels.   PAST MEDICAL, FAMILY, AND SOCIAL HISTORY:  1. 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 He and his husband live in [place]. They were married in the [address] of [place].  2. He has been walking more.     3. Primary care provider: Dr. Sharlot Gowda  REVIEW OF SYSTEMS: Mr. Gossman does not have any significant problems involving his other body systems.  PHYSICAL EXAM: BP 122/70   Pulse 88   Wt 205 lb (93 kg)   BMI 27.80 kg/m     Constitutional: Samuel Valentine looks healthy, but a bit heavier. He has gained 4 pounds since his last visit. He appears physically and emotionally well. His affect, engagement, and insight are normal.  Eyes: There is no arcus or proptosis.  Mouth: The oropharynx appears normal. The tongue appears normal. There is normal oral moisture. There is no obvious gingivitis. Neck: There are  no bruits present. The thyroid gland appears slightly enlarged. The thyroid gland is a bit larger at 22 grams in size. The right lobe is slightly enlarged. The left lobe is larger. The consistency of the thyroid gland is normal. The thyroid gland is again tender to palpation in the left mid-lobe today.    Lungs: The lungs are clear. Air movement is good. Heart: The heart rhythm and rate appear normal. Heart sounds S1 and S2 are normal. I do not appreciate any pathologic heart murmurs. Abdomen: The abdomen is enlarged. Bowel sounds are normal. The abdomen is soft and non-tender. There is no obviously palpable hepatomegaly, splenomegaly, or other masses.  Arms: Muscle mass appears appropriate for age.  Hands: There is no obvious tremor. Phalangeal and metacarpophalangeal joints appear normal. His rash has resolved.  Legs: Muscle mass appears appropriate for age. There is no edema.  Feet: There are no significant deformities. Dorsalis pedis pulses are 2+ bilaterally. His feet look good. He has no obvious tinea pedis of the feet today.   Neurologic: Muscle strength is normal for age and gender in both the upper and the lower extremities. Muscle tone appears normal. Sensation to touch is normal in the legs and in the feet today.    LABS:   Labs 01/24/17: HbA1c 6.7%, CBG 99  Labs 10/27/16: HbA1c 8.6%,CBG 174  Labs 10/25/16: CMP normal, except for glucose 223; HIV antibody non-reactive  Labs 07/16/16: HbA1c 8.6%   Labs 06/25/16: Surveillance labs for hepatology: HIV negative; Amylase 155 (ref 0-105), lipase 34 (ref 7-60); CBC normal; CK 71 (ref 7-232); CMP normal except glucose 293; phosphorus 2.6 (ref 2.5-4.5)  Labs 03/31/16: HbA1c 8.4%; TSH 1.07, free T4 1.1, free T3 3.4; cholesterol 162, triglycerides 49, HDL 86, LDL 66; urinary microalbumin/creatinine ratio 1; CMP normal, except for glucose 173;   Labs 12/30/15: HbA1c 8.1%  Labs 10/01/15: HbA1c 7.8%  Labs 11/18/14; HbA1c 8.9%, compared with 7.7% at last visit and with 7.0% at the visit prior. CMP normal except for glucose of 173, normal AST and ALT; cholesterol 161, triglycerides 8, HDL 67, LDL 78; urinary microalbumin/creatinine ratio 2.5; TSH 1.357, free T4 1.04, free T3 3.0  Labs 12/06/12: CMP with normal AST and ALT. Cholesterol 136, triglycerides 74, HDL 47 (decreased from 58 when he was exercising), LDL 74; microalbumin/creatinine ratio 2.3; TSH 1.782, free T4 0.98, free T3 3.1  Labs 02/03/12: CMP: Normal; TSH 1.565, free T4 0.98, free T3 3.0; cholesterol 156, triglycerides 125, HDL 58, LDL 73; Urinary microalbumin/creatinine ratio 4.2.  ASSESSMENT:  1. T1DM:   A. His BG control is much better in terms of his average BG, but at the cost of too many low BGs.    B. All of his higher BGs occurred late on the third day of his sites or on the fourth days. There are still many glucose spikes that are probably diet-related. He needs to be more consistent with his diet and his exercise regimen. 2. Hypoglycemia:   A. He is having too many low BGs.   B. Because he did not come  to our clinic to be started on manual mode and then on auto mode, he did not learn a crucial element of auto mode. He is not supposed to do extra correction boluses while he is in auto mode. He is only supposed to do correction boluses when he is in manual mode. He has inadvertently been tweaking the system and causing himself to have too many low BGs. When he then  sometimes overtreats the low BGs, his BGs then go higher. A hectic or sawtooth BG pattern then occurs.  3. Thyroiditis: His thyroiditis is clinically active today. 4. Goiter: The thyroid gland has increased in size a bit since his last visit. The waxing and waning of thyroid gland size is c/w evolving Hashimoto's disease.  He was euthyroid in March 2014, February 2016, and July 2017.   5. Hypertension: His BP is good today. He needs to continue to exercise and to lose fat  weight.  6. Hyperlipidemia: Lipid panel results in March 2014, February 2016, and July 2017 were good overall.  7. Autonomic neuropathy, tachycardia, and gastroparesis: With the worsening of BG control he had had in the past, his neuropathy, tachycardia, and gastroparesis worsened in parallel. He is doing progressively better now in all of his autonomic symptoms.   8. Peripheral neuropathy: His neuropathy is not evident today.   9. Adjustment reaction: He is doing very well today.  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 His LFTs and total bilirubin are quite normal. I asked Samuel Valentine to talk with his hepatologist about this issue. The hepatologist thought that these values were related to his diabetes. That is not a complication of DM that I recognize.    PLAN:  1. Diagnostic: HbA1c and CBG today. Call Dr. Fransico Finnian Husted next Monday evening to discuss BGs. Set up a 2-week follow up with Ms. Celene Skeenbarra to  make further auto mode setting adjustments.  Annual surveillance labs prior to next visit.  2. Therapeutic:   A. Do not do correction boluses while in auto mode. Allow the pump-sensor system to correct BGs. Change pump sites every three days. Use Lamisil or other anti-fungal agent for tinea pedis. Continue enalapril and atorvastatin. Eat right. Exercise right.   B. Continue current pump settings.  MN: 0.925 3 AM: 1.45 4:30 AM:  1.50 8 AM: 1.25 12:00 noon: 0.90 5 PM: 0.825 7 PM: 1.20 3. Patient education: We discussed life style issues and pump-sensor issues.  4. Follow up: FU appointment in 2 months.  Level of Service: This visit lasted in excess of 95 minutes. More than 50% of the visit was devoted to counseling.  Samuel Valentine. MD, CDE Adult and Pediatric Endocrinology

## 2017-01-24 NOTE — Patient Instructions (Signed)
Follow up visit in 2 months. Please call Dr. Fransico Allyn Bertoni next Monday evening between 8:00-9:30 PM.

## 2017-01-25 ENCOUNTER — Encounter (INDEPENDENT_AMBULATORY_CARE_PROVIDER_SITE_OTHER): Payer: BC Managed Care – PPO | Admitting: *Deleted

## 2017-01-25 VITALS — BP 117/77 | HR 84 | Temp 98.6°F | Wt 206.0 lb

## 2017-01-25 DIAGNOSIS — Z006 Encounter for examination for normal comparison and control in clinical research program: Secondary | ICD-10-CM

## 2017-01-25 LAB — COMPREHENSIVE METABOLIC PANEL
ALBUMIN: 4.4 g/dL (ref 3.6–5.1)
ALK PHOS: 69 U/L (ref 40–115)
ALT: 20 U/L (ref 9–46)
AST: 22 U/L (ref 10–40)
BILIRUBIN TOTAL: 0.6 mg/dL (ref 0.2–1.2)
BUN: 10 mg/dL (ref 7–25)
CALCIUM: 9.4 mg/dL (ref 8.6–10.3)
CO2: 27 mmol/L (ref 20–31)
CREATININE: 1.13 mg/dL (ref 0.60–1.35)
Chloride: 100 mmol/L (ref 98–110)
GLUCOSE: 157 mg/dL — AB (ref 65–99)
Potassium: 4.3 mmol/L (ref 3.5–5.3)
Sodium: 139 mmol/L (ref 135–146)
TOTAL PROTEIN: 7.2 g/dL (ref 6.1–8.1)

## 2017-01-25 LAB — CBC WITH DIFFERENTIAL/PLATELET
BASOS ABS: 0 {cells}/uL (ref 0–200)
Basophils Relative: 0 %
EOS PCT: 2 %
Eosinophils Absolute: 118 cells/uL (ref 15–500)
HCT: 42.3 % (ref 38.5–50.0)
Hemoglobin: 14.1 g/dL (ref 13.2–17.1)
Lymphocytes Relative: 42 %
Lymphs Abs: 2478 cells/uL (ref 850–3900)
MCH: 31.1 pg (ref 27.0–33.0)
MCHC: 33.3 g/dL (ref 32.0–36.0)
MCV: 93.2 fL (ref 80.0–100.0)
MONOS PCT: 5 %
MPV: 9.6 fL (ref 7.5–12.5)
Monocytes Absolute: 295 cells/uL (ref 200–950)
NEUTROS ABS: 3009 {cells}/uL (ref 1500–7800)
Neutrophils Relative %: 51 %
PLATELETS: 217 10*3/uL (ref 140–400)
RBC: 4.54 MIL/uL (ref 4.20–5.80)
RDW: 14.1 % (ref 11.0–15.0)
WBC: 5.9 10*3/uL (ref 3.8–10.8)

## 2017-01-25 LAB — PHOSPHORUS: PHOSPHORUS: 3.6 mg/dL (ref 2.5–4.5)

## 2017-01-26 LAB — LIPASE: Lipase: 13 U/L (ref 7–60)

## 2017-01-26 LAB — AMYLASE: Amylase: 169 U/L — ABNORMAL HIGH (ref 0–105)

## 2017-01-26 LAB — HIV ANTIBODY (ROUTINE TESTING W REFLEX): HIV 1&2 Ab, 4th Generation: NONREACTIVE

## 2017-01-26 LAB — CK: CK TOTAL: 112 U/L (ref 7–232)

## 2017-01-26 NOTE — Progress Notes (Signed)
Study: A Phase 2b/3 Double Blind Safety and Efficacy Study of Injectable Cabotegravir compared to Daily Oral Tenofovir Disoproxil Fumarate/Emtricitabine (TDF/FTC), For Pre-Exposure Prophylaxis in HIV-Uninfected Cisgender Men and Transgender Women who have sex with Men.  Medication: Investigational Injectable Cabotegravir/placebo compared to Truvada/placebo. Duration: Around 4 years.  Samuel Valentine is here for week 41. HIV rapid confirmed non-reactive. He returned #47 pills of oral study product. Questionnaires completed. He received injection into right gluteal muscle with no problems. Oral study product dispensed. $50 gift card given for visit. Will see him 5/21 for next visit.

## 2017-01-29 ENCOUNTER — Other Ambulatory Visit: Payer: Self-pay | Admitting: "Endocrinology

## 2017-02-07 ENCOUNTER — Ambulatory Visit (INDEPENDENT_AMBULATORY_CARE_PROVIDER_SITE_OTHER): Payer: BC Managed Care – PPO | Admitting: *Deleted

## 2017-02-07 ENCOUNTER — Encounter (INDEPENDENT_AMBULATORY_CARE_PROVIDER_SITE_OTHER): Payer: BC Managed Care – PPO | Admitting: *Deleted

## 2017-02-07 VITALS — BP 112/68 | Wt 207.2 lb

## 2017-02-07 VITALS — BP 112/68 | HR 72 | Temp 98.6°F | Wt 207.2 lb

## 2017-02-07 DIAGNOSIS — IMO0001 Reserved for inherently not codable concepts without codable children: Secondary | ICD-10-CM

## 2017-02-07 DIAGNOSIS — E1065 Type 1 diabetes mellitus with hyperglycemia: Secondary | ICD-10-CM | POA: Diagnosis not present

## 2017-02-07 DIAGNOSIS — Z006 Encounter for examination for normal comparison and control in clinical research program: Secondary | ICD-10-CM

## 2017-02-07 LAB — CBC WITH DIFFERENTIAL/PLATELET
BASOS ABS: 0 {cells}/uL (ref 0–200)
Basophils Relative: 0 %
Eosinophils Absolute: 0 cells/uL — ABNORMAL LOW (ref 15–500)
Eosinophils Relative: 0 %
HCT: 42.7 % (ref 38.5–50.0)
HEMOGLOBIN: 13.9 g/dL (ref 13.2–17.1)
LYMPHS ABS: 2438 {cells}/uL (ref 850–3900)
Lymphocytes Relative: 46 %
MCH: 30.2 pg (ref 27.0–33.0)
MCHC: 32.6 g/dL (ref 32.0–36.0)
MCV: 92.8 fL (ref 80.0–100.0)
MPV: 10.3 fL (ref 7.5–12.5)
Monocytes Absolute: 318 cells/uL (ref 200–950)
Monocytes Relative: 6 %
NEUTROS ABS: 2544 {cells}/uL (ref 1500–7800)
Neutrophils Relative %: 48 %
Platelets: 202 10*3/uL (ref 140–400)
RBC: 4.6 MIL/uL (ref 4.20–5.80)
RDW: 14.3 % (ref 11.0–15.0)
WBC: 5.3 10*3/uL (ref 3.8–10.8)

## 2017-02-07 LAB — POCT GLUCOSE (DEVICE FOR HOME USE): POC Glucose: 196 mg/dl — AB (ref 70–99)

## 2017-02-07 NOTE — Progress Notes (Signed)
Training on 670G insulin pump.  Samuel Valentine was here for a follow up on his insulin pump. He was using the 530G along with the Enlite sensor, so when he upgraded his insulin pump to the 670G he added his insulin pump settings and started on the Auto mode by himself. At the last office visit with Dr. Fransico MichaelBrennan he had several low Bg's that concerned Dr. Fransico MichaelBrennan. Samuel Valentine was overriding the Auto mode system and was giving himself corrections after his meal boluses which was dropping his Blood sugars.   On the last office visit with Dr. Fransico MichaelBrennan, he was advised to let the pump do the auto Mode and not to Bolus two hours after his meal boluses.  Also was to pre dose 15-20 mins before his meals.  Today his download shows his average Bg of 155 with +-54 and an average SG of 147 +- 45.  Auto mode at 97% and Manual mode at 3%. He said he feel a lot better because his Blood sugars are not up and or down as they were before.  He stated that it was hard at the beginning to give up some of the control, especially because it was instilled in him to correct BG 2 hours after meals, now he only had three corrections in the last 2 weeks.  He also thinks that his sensitivity is a little low at 25, suggesting to increase a little bit.   Assessment / Plan: Patient is doing great with his 670G insulin pump, it was hard for him to not bolus two hours after meals as he was doing before for 10 years.  He is doing more pre-meal bolus and is able to see the difference in his bg's. Suggested calibration 3-4 times a day, even though he was checking Bg's 3-4 he was not calibrating the sensor.  Also suggested to Target BG at 120 when he is not in Auto mode so that his body is used to the same BG Target when in and out of Atuo mode. Sensitivity from 25 to 30 to see if will prevent some hypoglycemia.  Discussed using the Temp Target when exercising.  Advised to call if any questions or concerns arise.

## 2017-02-07 NOTE — Progress Notes (Signed)
Study: A Phase 2b/3 Double Blind Safety and Efficacy Study of Injectable Cabotegravir compared to Daily Oral Tenofovir Disoproxil Fumarate/Emtricitabine (TDF/FTC), For Pre-Exposure Prophylaxis in HIV-Uninfected Cisgender Men and Transgender Women who have sex with Men.  Medication: Investigational Injectable Cabotegravir/placebo compared to Truvada/placebo. Duration: Around 4 years.  Samuel Valentine is here for week 43. He denies any new issues. No injection site reaction. Questionnaire completed. Blood drawn and HIV rapid confirmed non-reactive. He received $50 giftcard for visit. Next appointment scheduled for 03/28/17 @ 1:15pm

## 2017-02-08 LAB — COMPREHENSIVE METABOLIC PANEL
ALBUMIN: 4.4 g/dL (ref 3.6–5.1)
ALT: 20 U/L (ref 9–46)
AST: 21 U/L (ref 10–40)
Alkaline Phosphatase: 69 U/L (ref 40–115)
BILIRUBIN TOTAL: 0.5 mg/dL (ref 0.2–1.2)
BUN: 11 mg/dL (ref 7–25)
CHLORIDE: 102 mmol/L (ref 98–110)
CO2: 27 mmol/L (ref 20–31)
CREATININE: 1.15 mg/dL (ref 0.60–1.35)
Calcium: 9.5 mg/dL (ref 8.6–10.3)
GLUCOSE: 119 mg/dL — AB (ref 65–99)
Potassium: 4.3 mmol/L (ref 3.5–5.3)
Sodium: 139 mmol/L (ref 135–146)
Total Protein: 7 g/dL (ref 6.1–8.1)

## 2017-02-08 LAB — LIPASE: Lipase: 20 U/L (ref 7–60)

## 2017-02-08 LAB — CK: Total CK: 109 U/L (ref 44–196)

## 2017-02-08 LAB — HIV ANTIBODY (ROUTINE TESTING W REFLEX): HIV 1&2 Ab, 4th Generation: NONREACTIVE

## 2017-02-08 LAB — AMYLASE: Amylase: 172 U/L — ABNORMAL HIGH (ref 21–101)

## 2017-02-08 LAB — PHOSPHORUS: Phosphorus: 3.5 mg/dL (ref 2.5–4.5)

## 2017-02-22 NOTE — Research (Signed)
Met with ppt to review retention strategies that assist in research. Ppt noted it helps for the blood draw to be the first step in the visit so they can complete the exam in a timely fashion. Ppt works full time, and they can complete surveys while waiting for screenings. Ppt also noted that they regularly use the mychart app to check lab results and communication with their [facility] providers. Ppt had an insurance billing error in the past that has since been corrected.

## 2017-03-08 ENCOUNTER — Other Ambulatory Visit: Payer: Self-pay | Admitting: "Endocrinology

## 2017-03-28 ENCOUNTER — Ambulatory Visit (INDEPENDENT_AMBULATORY_CARE_PROVIDER_SITE_OTHER): Payer: BC Managed Care – PPO | Admitting: "Endocrinology

## 2017-03-28 ENCOUNTER — Encounter (INDEPENDENT_AMBULATORY_CARE_PROVIDER_SITE_OTHER): Payer: BC Managed Care – PPO | Admitting: *Deleted

## 2017-03-28 VITALS — BP 113/73 | HR 73 | Temp 98.0°F | Wt 208.5 lb

## 2017-03-28 DIAGNOSIS — Z006 Encounter for examination for normal comparison and control in clinical research program: Secondary | ICD-10-CM

## 2017-03-28 LAB — CBC WITH DIFFERENTIAL/PLATELET
BASOS PCT: 0 %
Basophils Absolute: 0 cells/uL (ref 0–200)
EOS ABS: 40 {cells}/uL (ref 15–500)
Eosinophils Relative: 1 %
HEMATOCRIT: 42.3 % (ref 38.5–50.0)
Hemoglobin: 14 g/dL (ref 13.2–17.1)
Lymphocytes Relative: 58 %
Lymphs Abs: 2320 cells/uL (ref 850–3900)
MCH: 31.2 pg (ref 27.0–33.0)
MCHC: 33.1 g/dL (ref 32.0–36.0)
MCV: 94.2 fL (ref 80.0–100.0)
MONO ABS: 200 {cells}/uL (ref 200–950)
MPV: 10.3 fL (ref 7.5–12.5)
Monocytes Relative: 5 %
NEUTROS ABS: 1440 {cells}/uL — AB (ref 1500–7800)
Neutrophils Relative %: 36 %
Platelets: 205 10*3/uL (ref 140–400)
RBC: 4.49 MIL/uL (ref 4.20–5.80)
RDW: 14.2 % (ref 11.0–15.0)
WBC: 4 10*3/uL (ref 3.8–10.8)

## 2017-03-28 NOTE — Progress Notes (Signed)
Samuel Valentine is here for his week 49 injection visit [facility]. He denies any new problems and says his blood sugars are doing much better with his new pump. He denies any new risky sexual encounters or any new neuro symptoms. He continues to have vivid dreams. He will return in 2 weeks for the next visit.

## 2017-03-29 LAB — COMPREHENSIVE METABOLIC PANEL
ALBUMIN: 4.4 g/dL (ref 3.6–5.1)
ALK PHOS: 65 U/L (ref 40–115)
ALT: 22 U/L (ref 9–46)
AST: 25 U/L (ref 10–40)
BUN: 12 mg/dL (ref 7–25)
CALCIUM: 9.6 mg/dL (ref 8.6–10.3)
CO2: 26 mmol/L (ref 20–31)
Chloride: 99 mmol/L (ref 98–110)
Creat: 1.23 mg/dL (ref 0.60–1.35)
Glucose, Bld: 148 mg/dL — ABNORMAL HIGH (ref 65–99)
POTASSIUM: 4.5 mmol/L (ref 3.5–5.3)
Sodium: 140 mmol/L (ref 135–146)
TOTAL PROTEIN: 7 g/dL (ref 6.1–8.1)
Total Bilirubin: 0.4 mg/dL (ref 0.2–1.2)

## 2017-03-29 LAB — CK: CK TOTAL: 104 U/L (ref 44–196)

## 2017-03-29 LAB — AMYLASE: AMYLASE: 190 U/L — AB (ref 21–101)

## 2017-03-29 LAB — PHOSPHORUS: Phosphorus: 3.6 mg/dL (ref 2.5–4.5)

## 2017-03-29 LAB — HIV ANTIBODY (ROUTINE TESTING W REFLEX): HIV 1&2 Ab, 4th Generation: NONREACTIVE

## 2017-03-29 LAB — LIPASE: LIPASE: 20 U/L (ref 7–60)

## 2017-03-31 ENCOUNTER — Ambulatory Visit: Payer: BC Managed Care – PPO | Admitting: Medical

## 2017-03-31 ENCOUNTER — Encounter: Payer: Self-pay | Admitting: Medical

## 2017-03-31 ENCOUNTER — Ambulatory Visit (INDEPENDENT_AMBULATORY_CARE_PROVIDER_SITE_OTHER): Payer: BC Managed Care – PPO | Admitting: Medical

## 2017-03-31 VITALS — BP 110/60 | Wt 207.0 lb

## 2017-03-31 DIAGNOSIS — F322 Major depressive disorder, single episode, severe without psychotic features: Secondary | ICD-10-CM

## 2017-03-31 DIAGNOSIS — R45851 Suicidal ideations: Secondary | ICD-10-CM | POA: Diagnosis not present

## 2017-03-31 MED ORDER — FLUOXETINE HCL 20 MG PO TABS: 20.0000 mg | ORAL_TABLET | Freq: Every day | ORAL | 2 refills | Status: DC

## 2017-03-31 NOTE — Progress Notes (Signed)
Subjective: Chief Complaint  Patient presents with  . Depression    having issues depression    Here for concern for depression.    He notes for the past year or more, has had constant underlying sadness and depression.   In his early adult years, was on Zoloft and saw counselor regularly.  But then for a period of time did fine off medication and treatment.   He notes he can feel happy at times, but still feels underlying sadness for last several weeks or more.   has agreement with his husband, that if got to a point of not being able to take it anymore, he will let husband know.  The idea would be to travel to [place] for a chance to feel better.  They have apparently done this prior.  Feels like he wants to escape or hibernate and get away from time to time.   Been feeling "shitty."  He talked to some of his friends, husband, and they encouraged him to go back on treatment, possibly restart medication.   Has taken some days off work lately to help clear his head.  Can't clear his head lately.   Got new insulin pump in march, and about a month after being on new pump, felt like his mood started to change for worse.   Wonders if high sugars were masking sadness prior to new pump.  Has an appt today at 2pm with counseling, Lydia Long.  This person was recommended by another friend.    Is a Landscape architect for insurance enrollments, has lots of customer request, lots of politics on campus, so has some work stress, but rest of life day to day is fine otherwise.  Loves husband dearly.  Doesn't want to harm himself but has had ideation.  Has had thoughts of jumping off a building. No other specific plans or means.   Attempted suicide as a teenager, took a bunch a pills, vomited a whole lot.     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  He tired some other medications prior but can't remember which ones.   Some of his stressors is feeling of having to be in charge all the time at  work and at home.  Although he and his husband share in duties around the house, he feels like people rely too much on him to make little specific decisions instead of that person taking ownership of a task.     He reports he had a "fucked up child hood."  Was told he was worthless, stupid, stinky.  He does have occasional random crying spells  No other aggravating or relieving factors. No other complaint.   Past Medical History:  Diagnosis Date  . Chronic hepatitis B ([place])    followed at [facility]  . Hypercholesterolemia   . Type 1 diabetes ([place])    Dr. Tobe Sos   Current Outpatient Prescriptions on File Prior to Visit  Medication Sig Dispense Refill  . aspirin 81 MG chewable tablet Chew 81 mg by mouth 2 (two) times daily.     Marland Kitchen atorvastatin (LIPITOR) 10 MG tablet TAKE 1 TABLET BY MOUTH EVERY DAY 90 tablet 3  . CONTOUR NEXT TEST test strip USE TO TEST BLOOD GLUCOSE 6-8 TIMES DAILY AS DIRECTED 250 each 5  . emtricitabine-tenofovir (TRUVADA) 200-300 MG tablet Take 1 tablet by mouth daily. This may be placebo and is study provided. He is also taking cabotegravir/placebo '30mg'$  daily.    Marland Kitchen  enalapril (VASOTEC) 5 MG tablet TAKE 2 TABLETS BY MOUTH TWICE DAILY 360 tablet 0  . glucagon (GLUCAGON EMERGENCY) 1 MG injection Inject 1 mg into the vein once as needed. 2 kit 2  . LANTUS SOLOSTAR 100 UNIT/ML Solostar Pen Use up to 50 units per day during pump failure. 10 mL 6  . Multiple Vitamin (MULTIVITAMIN) tablet Take 1 tablet by mouth daily.      Marland Kitchen NOVOLOG 100 UNIT/ML injection ADMINISTER 300 UNITS VIA INSULIN PUMP EVERY 48 HOURS 5 vial 5  . NOVOLOG PENFILL cartridge USE IN INSULIN PUMP AS DIRECTED(300 UNITS EVERY 2-3 DAYS) 120 mL 0  . valACYclovir (VALTREX) 1000 MG tablet 2 pills twice a day at the first sign of cold sore (Patient not taking: Reported on 01/24/2017) 12 tablet 5   No current facility-administered medications on file prior to visit.    ROS as in subjective   Objective: BP 110/60   Wt  207 lb (93.9 kg)   SpO2 98%   BMI 28.07 kg/m   Gen: wd, wn, nad Psych: pleasant but seems irritable, down in mood, but answers questions appropriately   Assessment: Encounter Diagnoses  Name Primary?  . Depression, major, single episode, severe ([place]) Yes  . Suicidal ideation     Plan: He gives verbal contract to not attempt suicide.  He has f/u with counselor today, appt in just a few hours . Begin trial of Prozac. discussed risks/benefits of medication, proper use, and f/u.   discussed ways to cope with his mood changes.  advised he consider taking some time off work.  He will discuss with his counselor today.   F/u 2-3 wk.   Sign HIPPA so we can talk with his counselor .  Also discussed case with Dr. Redmond School today, his supervising PCP who is here in the building today.  He will f/u with Dr. Redmond School.  Kendyl was seen today for depression.  Diagnoses and all orders for this visit:  Depression, major, single episode, severe ([place])  Suicidal ideation  Other orders -     FLUoxetine (PROZAC) 20 MG tablet; Take 1 tablet (20 mg total) by mouth daily.

## 2017-04-11 ENCOUNTER — Encounter (INDEPENDENT_AMBULATORY_CARE_PROVIDER_SITE_OTHER): Payer: BC Managed Care – PPO | Admitting: *Deleted

## 2017-04-11 VITALS — BP 108/72 | HR 69 | Temp 98.0°F | Wt 208.0 lb

## 2017-04-11 DIAGNOSIS — Z006 Encounter for examination for normal comparison and control in clinical research program: Secondary | ICD-10-CM

## 2017-04-11 LAB — CBC WITH DIFFERENTIAL/PLATELET
BASOS PCT: 0 %
Basophils Absolute: 0 cells/uL (ref 0–200)
EOS ABS: 51 {cells}/uL (ref 15–500)
EOS PCT: 1 %
HCT: 40.7 % (ref 38.5–50.0)
HEMOGLOBIN: 13.4 g/dL (ref 13.2–17.1)
LYMPHS PCT: 58 %
Lymphs Abs: 2958 cells/uL (ref 850–3900)
MCH: 31.1 pg (ref 27.0–33.0)
MCHC: 32.9 g/dL (ref 32.0–36.0)
MCV: 94.4 fL (ref 80.0–100.0)
MONO ABS: 357 {cells}/uL (ref 200–950)
MPV: 9.9 fL (ref 7.5–12.5)
Monocytes Relative: 7 %
NEUTROS PCT: 34 %
Neutro Abs: 1734 cells/uL (ref 1500–7800)
Platelets: 220 10*3/uL (ref 140–400)
RBC: 4.31 MIL/uL (ref 4.20–5.80)
RDW: 13.8 % (ref 11.0–15.0)
WBC: 5.1 10*3/uL (ref 3.8–10.8)

## 2017-04-11 LAB — COMPREHENSIVE METABOLIC PANEL
ALBUMIN: 4.3 g/dL (ref 3.6–5.1)
ALK PHOS: 62 U/L (ref 40–115)
ALT: 22 U/L (ref 9–46)
AST: 21 U/L (ref 10–40)
BILIRUBIN TOTAL: 0.8 mg/dL (ref 0.2–1.2)
BUN: 10 mg/dL (ref 7–25)
CO2: 31 mmol/L (ref 20–31)
CREATININE: 1.16 mg/dL (ref 0.60–1.35)
Calcium: 9.2 mg/dL (ref 8.6–10.3)
Chloride: 100 mmol/L (ref 98–110)
Glucose, Bld: 167 mg/dL — ABNORMAL HIGH (ref 65–99)
Potassium: 4.5 mmol/L (ref 3.5–5.3)
Sodium: 136 mmol/L (ref 135–146)
TOTAL PROTEIN: 6.7 g/dL (ref 6.1–8.1)

## 2017-04-11 LAB — HIV ANTIBODY (ROUTINE TESTING W REFLEX): HIV: NONREACTIVE

## 2017-04-11 LAB — AMYLASE: Amylase: 168 U/L — ABNORMAL HIGH (ref 21–101)

## 2017-04-11 LAB — PHOSPHORUS: PHOSPHORUS: 3.4 mg/dL (ref 2.5–4.5)

## 2017-04-11 LAB — CK: Total CK: 125 U/L (ref 44–196)

## 2017-04-11 LAB — LIPASE: Lipase: 58 U/L (ref 7–60)

## 2017-04-11 NOTE — Progress Notes (Signed)
Samuel Valentine is here for his week 51 study visit [facility]. He denies any new problems but does say that he started prozac recently for depression. He said he had been feeling a little down over past few months more related to the politics and state of the world. He has been on Effexor before years ago. He denies any ISR or other problems. Will return in September for next injection visit.

## 2017-04-19 ENCOUNTER — Ambulatory Visit (INDEPENDENT_AMBULATORY_CARE_PROVIDER_SITE_OTHER): Payer: BC Managed Care – PPO | Admitting: "Endocrinology

## 2017-05-12 ENCOUNTER — Ambulatory Visit (INDEPENDENT_AMBULATORY_CARE_PROVIDER_SITE_OTHER): Payer: BC Managed Care – PPO | Admitting: "Endocrinology

## 2017-05-25 ENCOUNTER — Encounter (INDEPENDENT_AMBULATORY_CARE_PROVIDER_SITE_OTHER): Payer: BC Managed Care – PPO | Admitting: *Deleted

## 2017-05-25 VITALS — BP 111/76 | HR 71 | Temp 98.9°F | Wt 210.0 lb

## 2017-05-25 DIAGNOSIS — Z006 Encounter for examination for normal comparison and control in clinical research program: Secondary | ICD-10-CM

## 2017-05-25 NOTE — Progress Notes (Signed)
Study: A Phase 2b/3 Double Blind Safety and Efficacy Study of Injectable Cabotegravir compared to Daily Oral Tenofovir Disoproxil Fumarate/Emtricitabine (TDF/FTC), For Pre-Exposure Prophylaxis in HIV-Uninfected Cisgender Men and Transgender Women who have sex with Men.  Medication: Investigational Injectable Cabotegravir/placebo compared to Truvada/placebo. Duration: Around 4 years.  Samuel Valentine is here for week 57. Fasting labs were drawn with no problems. He denies any new issues or changes since last study visit. Assessment reveals no new findings. HIV rapid confirmed non-reactive. Questionnaires completed. He returned #37 pills of oral study product. Injection given in right gluteal muscle with no problems. Oral study product dispensed. He received $50 gift card for visit. Next appt scheduled for 9/17.

## 2017-05-26 LAB — LIPID PANEL
CHOL/HDL RATIO: 2 (calc) (ref <5.0)
CHOLESTEROL: 131 mg/dL (ref <200)
HDL: 64 mg/dL (ref >40)
LDL CHOLESTEROL (CALC): 53 mg/dL
NON-HDL CHOLESTEROL (CALC): 67 mg/dL (ref <130)
Triglycerides: 63 mg/dL (ref <150)

## 2017-05-26 LAB — COMPREHENSIVE METABOLIC PANEL
AG Ratio: 1.8 (calc) (ref 1.0–2.5)
ALBUMIN MSPROF: 4.3 g/dL (ref 3.6–5.1)
ALKALINE PHOSPHATASE (APISO): 65 U/L (ref 40–115)
ALT: 25 U/L (ref 9–46)
AST: 22 U/L (ref 10–40)
BILIRUBIN TOTAL: 0.3 mg/dL (ref 0.2–1.2)
BUN: 13 mg/dL (ref 7–25)
CALCIUM: 9.4 mg/dL (ref 8.6–10.3)
CO2: 29 mmol/L (ref 20–32)
Chloride: 101 mmol/L (ref 98–110)
Creat: 1.07 mg/dL (ref 0.60–1.35)
Globulin: 2.4 g/dL (calc) (ref 1.9–3.7)
Glucose, Bld: 126 mg/dL — ABNORMAL HIGH (ref 65–99)
POTASSIUM: 4.4 mmol/L (ref 3.5–5.3)
SODIUM: 139 mmol/L (ref 135–146)
TOTAL PROTEIN: 6.7 g/dL (ref 6.1–8.1)

## 2017-05-26 LAB — C. TRACHOMATIS/N. GONORRHOEAE RNA
C. TRACHOMATIS RNA, TMA: NOT DETECTED
N. GONORRHOEAE RNA, TMA: NOT DETECTED

## 2017-05-26 LAB — CBC WITH DIFFERENTIAL/PLATELET
Basophils Absolute: 10 cells/uL (ref 0–200)
Basophils Relative: 0.2 %
EOS ABS: 30 {cells}/uL (ref 15–500)
Eosinophils Relative: 0.6 %
HEMATOCRIT: 41.8 % (ref 38.5–50.0)
HEMOGLOBIN: 14 g/dL (ref 13.2–17.1)
LYMPHS ABS: 2655 {cells}/uL (ref 850–3900)
MCH: 31.5 pg (ref 27.0–33.0)
MCHC: 33.5 g/dL (ref 32.0–36.0)
MCV: 93.9 fL (ref 80.0–100.0)
MPV: 10.6 fL (ref 7.5–12.5)
Monocytes Relative: 7.4 %
NEUTROS ABS: 1935 {cells}/uL (ref 1500–7800)
Neutrophils Relative %: 38.7 %
Platelets: 212 10*3/uL (ref 140–400)
RBC: 4.45 10*6/uL (ref 4.20–5.80)
RDW: 12.8 % (ref 11.0–15.0)
Total Lymphocyte: 53.1 %
WBC mixed population: 370 cells/uL (ref 200–950)
WBC: 5 10*3/uL (ref 3.8–10.8)

## 2017-05-26 LAB — URINALYSIS
Bilirubin Urine: NEGATIVE
GLUCOSE, UA: NEGATIVE
Hgb urine dipstick: NEGATIVE
KETONES UR: NEGATIVE
Leukocytes, UA: NEGATIVE
Nitrite: NEGATIVE
Protein, ur: NEGATIVE
SPECIFIC GRAVITY, URINE: 1.011 (ref 1.001–1.03)
pH: 6 (ref 5.0–8.0)

## 2017-05-26 LAB — AMYLASE: Amylase: 134 U/L — ABNORMAL HIGH (ref 21–101)

## 2017-05-26 LAB — HIV ANTIBODY (ROUTINE TESTING W REFLEX): HIV 1&2 Ab, 4th Generation: NONREACTIVE

## 2017-05-26 LAB — CK: Total CK: 118 U/L (ref 44–196)

## 2017-05-26 LAB — RPR: RPR: NONREACTIVE

## 2017-05-26 LAB — HEPATITIS C ANTIBODY
Hepatitis C Ab: NONREACTIVE
SIGNAL TO CUT-OFF: 0.01 (ref <1.00)

## 2017-05-26 LAB — PHOSPHORUS: Phosphorus: 3.3 mg/dL (ref 2.5–4.5)

## 2017-05-26 LAB — LIPASE: Lipase: 14 U/L (ref 7–60)

## 2017-05-27 LAB — LIPID PANEL
CHOL/HDL RATIO: 2 (calc) (ref <5.0)
Cholesterol: 134 mg/dL (ref <200)
HDL: 68 mg/dL (ref >40)
LDL Cholesterol (Calc): 51 mg/dL (calc)
NON-HDL CHOLESTEROL (CALC): 66 mg/dL (ref <130)
Triglycerides: 71 mg/dL (ref <150)

## 2017-05-27 LAB — SPECIMEN ID NOTIFICATION MISSING 2ND ID

## 2017-05-27 LAB — AMYLASE: Amylase: 133 U/L — ABNORMAL HIGH (ref 21–101)

## 2017-05-27 LAB — COMPLETE METABOLIC PANEL WITH GFR
AG RATIO: 1.9 (calc) (ref 1.0–2.5)
ALKALINE PHOSPHATASE (APISO): 65 U/L (ref 40–115)
ALT: 25 U/L (ref 9–46)
AST: 22 U/L (ref 10–40)
Albumin: 4.4 g/dL (ref 3.6–5.1)
BILIRUBIN TOTAL: 0.5 mg/dL (ref 0.2–1.2)
BUN: 15 mg/dL (ref 7–25)
CHLORIDE: 101 mmol/L (ref 98–110)
CO2: 30 mmol/L (ref 20–32)
Calcium: 9.4 mg/dL (ref 8.6–10.3)
Creat: 1.2 mg/dL (ref 0.60–1.35)
GFR, Est African American: 82 mL/min/{1.73_m2} (ref >=60)
GFR, Est Non African American: 71 mL/min/{1.73_m2} (ref >=60)
GLOBULIN: 2.3 g/dL (ref 1.9–3.7)
GLUCOSE: 145 mg/dL — AB (ref 65–99)
POTASSIUM: 4.8 mmol/L (ref 3.5–5.3)
SODIUM: 138 mmol/L (ref 135–146)
Total Protein: 6.7 g/dL (ref 6.1–8.1)

## 2017-05-27 LAB — T4, FREE: FREE T4: 1 ng/dL (ref 0.8–1.8)

## 2017-05-27 LAB — MICROALBUMIN / CREATININE URINE RATIO
Creatinine, Urine: 114 mg/dL (ref 20–370)
MICROALB UR: 0.3 mg/dL
MICROALB/CREAT RATIO: 3 ug/mg{creat} (ref <30)

## 2017-05-27 LAB — T3, FREE: T3 FREE: 3.1 pg/mL (ref 2.3–4.2)

## 2017-05-27 LAB — TSH: TSH: 1.68 mIU/L (ref 0.40–4.50)

## 2017-05-28 LAB — CT/NG RNA, TMA RECTAL
CHLAMYDIA TRACHOMATIS RNA, TMA, RECTAL: NOT DETECTED
NEISSERIA GONORRHOEAE RNA, TMA, RECTAL: NOT DETECTED

## 2017-06-06 ENCOUNTER — Encounter (INDEPENDENT_AMBULATORY_CARE_PROVIDER_SITE_OTHER): Payer: Self-pay | Admitting: *Deleted

## 2017-06-06 VITALS — BP 121/75 | HR 72 | Temp 98.2°F | Wt 209.5 lb

## 2017-06-06 DIAGNOSIS — Z006 Encounter for examination for normal comparison and control in clinical research program: Secondary | ICD-10-CM

## 2017-06-06 NOTE — Progress Notes (Signed)
Study: A Phase 2b/3 Double Blind Safety and Efficacy Study of Injectable Cabotegravir compared to Daily Oral Tenofovir Disoproxil Fumarate/Emtricitabine (TDF/FTC), For Pre-Exposure Prophylaxis in HIV-Uninfected Cisgender Men and Transgender Women who have sex with Men.  Medication: Investigational Injectable Cabotegravir/placebo compared to Truvada/placebo. Duration: Around 4 years.  Samuel Valentine is here for week 59 visit. Denies any problems with his last injection. No new complaints or concerns. States that he has been very adherent with his oral medication. Rapid HIV non-reactive. Declined condoms/lube. Next visit scheduled for 07/11/17 at 3:00pm.

## 2017-06-07 LAB — CBC WITH DIFFERENTIAL/PLATELET
BASOS ABS: 22 {cells}/uL (ref 0–200)
Basophils Relative: 0.3 %
EOS ABS: 29 {cells}/uL (ref 15–500)
EOS PCT: 0.4 %
HCT: 42.4 % (ref 38.5–50.0)
HEMOGLOBIN: 14.3 g/dL (ref 13.2–17.1)
Lymphs Abs: 2986 cells/uL (ref 850–3900)
MCH: 31.2 pg (ref 27.0–33.0)
MCHC: 33.7 g/dL (ref 32.0–36.0)
MCV: 92.4 fL (ref 80.0–100.0)
MONOS PCT: 5.6 %
MPV: 10.4 fL (ref 7.5–12.5)
NEUTROS ABS: 3854 {cells}/uL (ref 1500–7800)
NEUTROS PCT: 52.8 %
PLATELETS: 223 10*3/uL (ref 140–400)
RBC: 4.59 10*6/uL (ref 4.20–5.80)
RDW: 12.7 % (ref 11.0–15.0)
TOTAL LYMPHOCYTE: 40.9 %
WBC mixed population: 409 cells/uL (ref 200–950)
WBC: 7.3 10*3/uL (ref 3.8–10.8)

## 2017-06-07 LAB — COMPREHENSIVE METABOLIC PANEL
AG Ratio: 1.7 (calc) (ref 1.0–2.5)
ALKALINE PHOSPHATASE (APISO): 62 U/L (ref 40–115)
ALT: 21 U/L (ref 9–46)
AST: 20 U/L (ref 10–40)
Albumin: 4.3 g/dL (ref 3.6–5.1)
BILIRUBIN TOTAL: 0.4 mg/dL (ref 0.2–1.2)
BUN: 12 mg/dL (ref 7–25)
CO2: 29 mmol/L (ref 20–32)
Calcium: 9.4 mg/dL (ref 8.6–10.3)
Chloride: 99 mmol/L (ref 98–110)
Creat: 1.1 mg/dL (ref 0.60–1.35)
Globulin: 2.5 g/dL (calc) (ref 1.9–3.7)
Glucose, Bld: 165 mg/dL — ABNORMAL HIGH (ref 65–99)
Potassium: 4.1 mmol/L (ref 3.5–5.3)
Sodium: 138 mmol/L (ref 135–146)
Total Protein: 6.8 g/dL (ref 6.1–8.1)

## 2017-06-07 LAB — CK: Total CK: 117 U/L (ref 44–196)

## 2017-06-07 LAB — AMYLASE: AMYLASE: 144 U/L — AB (ref 21–101)

## 2017-06-07 LAB — HIV ANTIBODY (ROUTINE TESTING W REFLEX): HIV 1&2 Ab, 4th Generation: NONREACTIVE

## 2017-06-07 LAB — LIPASE: LIPASE: 13 U/L (ref 7–60)

## 2017-06-07 LAB — PHOSPHORUS: PHOSPHORUS: 3.6 mg/dL (ref 2.5–4.5)

## 2017-06-13 ENCOUNTER — Encounter (INDEPENDENT_AMBULATORY_CARE_PROVIDER_SITE_OTHER): Payer: Self-pay | Admitting: *Deleted

## 2017-06-15 ENCOUNTER — Ambulatory Visit (INDEPENDENT_AMBULATORY_CARE_PROVIDER_SITE_OTHER): Payer: BC Managed Care – PPO | Admitting: "Endocrinology

## 2017-06-15 ENCOUNTER — Encounter (INDEPENDENT_AMBULATORY_CARE_PROVIDER_SITE_OTHER): Payer: Self-pay | Admitting: "Endocrinology

## 2017-06-15 VITALS — BP 118/70 | HR 90 | Wt 205.4 lb

## 2017-06-15 DIAGNOSIS — E049 Nontoxic goiter, unspecified: Secondary | ICD-10-CM | POA: Diagnosis not present

## 2017-06-15 DIAGNOSIS — E1042 Type 1 diabetes mellitus with diabetic polyneuropathy: Secondary | ICD-10-CM

## 2017-06-15 DIAGNOSIS — R748 Abnormal levels of other serum enzymes: Secondary | ICD-10-CM | POA: Diagnosis not present

## 2017-06-15 DIAGNOSIS — E1065 Type 1 diabetes mellitus with hyperglycemia: Secondary | ICD-10-CM | POA: Diagnosis not present

## 2017-06-15 DIAGNOSIS — E1043 Type 1 diabetes mellitus with diabetic autonomic (poly)neuropathy: Secondary | ICD-10-CM | POA: Diagnosis not present

## 2017-06-15 DIAGNOSIS — E782 Mixed hyperlipidemia: Secondary | ICD-10-CM

## 2017-06-15 DIAGNOSIS — I10 Essential (primary) hypertension: Secondary | ICD-10-CM

## 2017-06-15 DIAGNOSIS — B353 Tinea pedis: Secondary | ICD-10-CM

## 2017-06-15 DIAGNOSIS — IMO0001 Reserved for inherently not codable concepts without codable children: Secondary | ICD-10-CM

## 2017-06-15 DIAGNOSIS — E063 Autoimmune thyroiditis: Secondary | ICD-10-CM | POA: Diagnosis not present

## 2017-06-15 DIAGNOSIS — E11649 Type 2 diabetes mellitus with hypoglycemia without coma: Secondary | ICD-10-CM

## 2017-06-15 LAB — POCT GLUCOSE (DEVICE FOR HOME USE): GLUCOSE FASTING, POC: 198 mg/dL — AB (ref 70–99)

## 2017-06-15 LAB — POCT GLYCOSYLATED HEMOGLOBIN (HGB A1C): Hemoglobin A1C: 7.1

## 2017-06-15 NOTE — Progress Notes (Signed)
CC: FU T1DM, hypoglycemia, hypertension, hyperlipidemia, autonomic neuropathy, tachycardia, gastroparesis, peripheral neuropathy, thyroiditis, goiter, hepatitis B, fatigue  HPI: Samuel Valentine is a 46 y.o. Caucasian gentleman. He was unaccompanied.   1. Samuel Valentine was diagnosed with T2DM about 2002-2003. He was treated with metformin, Avandia, and Amaryl, but BGs were not well controlled, so Lantus was begun in 2005. When it became apparent that he would need a multiple daily injection regimen with Lantus and Novolog, he was referred to me by [name], RN, CDE, of the [facility]. I saw him for the first time on 02/12/05. He had just been started on Novolog aspart insulin at meals (120/25/10 plan). His PMH included recurrent hypoglycemia, hypertension, dyslipidemia, and proteinuria. He was also allergic to penicillin. He had had oral surgery in the past, but no other surgery. His psychiatric diagnoses included depression, anxiety, and dissociative identity disorder. Social history included the fact that he was gay. All HIV tests had been negative through that time. He had recently been laid off and had no health insurance. In March 2006, his HbA1c had been 10.6%. On exam he had a goiter and mild peripheral neuropathy. 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 We accepted him as a charity patient and enrolled him in our Diabetes Survival Skills Program. He was converted to a Medtronic 508 insulin pump in August 2006.   2. During the next twelve  years his DM self-care efforts and HbA1c values waxed and waned, with A1c values ranging from 6.4%-9.2%. His worst BGs occurred when he was being treated with interferon for hepatitis B, from which he is now in remission. He has been gainfully employed at [employer], [employer], and again at [employer] for several years and has had health insurance. In 2011 we changed him to a Medtronic 723 (Revel) pump and  started him on the Medtronic CGM sensor. He later converted to a Medtronic 530G insulin pump. Since the introduction of CGM-pump therapy, his BGs have significantly improved when he has been consistent with his T1DM self-care. He started his new Medtronic 670G pump and Guardian 3 CGM on 12/07/16 and started in auto mode on 12/10/16.   3. His last [facility] visit was on 01/24/17. At that visit I continued his pump settings, but asked him not to override the auto mode. He was supposed to call me in two weeks so that we could adjust his pump settings, but he did not. He did see Ms. Ibarra on 02/07/17 and was doing much better about not overriding his pump. She reduced his ISF from 25 to 30.   A. In the interim he has been healthy. He has more joint problems, especially in his left hip.   B. He has had much more stable BGs when he is in auto mode, so he has been more willing to allow the pump to control his BGs.   C. He can exercise without having many low BGs if he starts his TBR 45-60 minutes before exercise.  He now walks 1.6 miles twice daily to and from work if the temperatures are not too high. He also uses his elliptical machine several times per week. He has been trying to eat better and is using the gym at his new housing complex more frequently.   D. He has not gained any weight since his last visit. He is trying to control his appetite in the mornings.    E. He has been  taking enalapril, 10 mg once daily, 10 mg of atorvastatin once daily, Centrum Silver, and two 81 mg ASA per day.  F. His work situation and marriage are doing well.  He is not traveling for work much now.    G. At his last visit at [facility] in 2017/12/15he was pronounced cured and released from further follow up care.  4. Pertinent Review of Systems: Constitutional: The patient feels "pretty good". 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 He was  having some really bad depression and took medication, but he was so numb emotionally that he stopped the meds. He is much more aware when his BGs trend downward now.  Eyes: Vision is not as good. His last eye exam was in June 2017. There were no signs of diabetic eye disease. He has presbyopia, but no other problems. He needs to schedule a follow up exam soon.  Neck:  He has not had any discomfort in his anterior neck since his last visit.  Heart: He has not had many problems with fast heart rate. Heart rate increases with exercise or other physical activity. He has not had any chest wall pains. He has no other complaints of irregular heat beats, chest pain, or chest pressure. Gastrointestinal: His reflux has not been bothering him. He occasionally has constipation if he doesn't drink enough fluid and take enough fiber. He does have occasional abdominal bloating, usually after eating a lot of fresh vegetables. Hands: His hand rash has not recurred while using Lamisil or Lotrimin.  Legs: His left hip has been bothering him more. Muscle mass and strength seem normal. There are no complaints of numbness, tingling, burning, or pain. No edema is noted.  Feet:  He continues to apply Lamisil or Lotrimin cream as a preventive measure about twice a week. There are no complaints of numbness, tingling, burning, or pain. No edema is noted. Hypoglycemia: He does not think that he is having many low BGs.   5 . BG printout: We only have data since 05/20/17. He changes sites every 5 days. He checks BGs 3-5 days, mostly 4 times. The longer his sites remain in place, the higher the BGs tend to be. His BGs have often been very variable. His two BGs >300 occurred late on the 5th day of sites. He has had no BGs <70. Some sites work better than others. His average BG is 172, compared with 187 at last visit and with 210 at the prior visit. His BG range is 75-324, compared with 55 to >400 at his last visit. Marland Kitchen  6. CGM sensor  readout: He has been in auto mode 96% of the time, but in SG range only 72% of the time. SGs are highest after lunch and after dinner. It appears that his carb counts are often low. It also appears that he needs higher ICRs for lunch and dinner. Overall his sensor values and BG values correlate quite well. The sensor has identified 17 BGs <70.Marland Kitchen His sensor values reflect his highly variable meal schedule, carb intakes, activity levels, bolusing, and stress levels.   PAST MEDICAL, FAMILY, AND SOCIAL HISTORY:  1. 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 He and his husband live in [place]. They were married in the [address] of [place].  2. He walks a lot sometimes, much less at other times.  3. Primary care provider: Dr. Sharlot Gowda  REVIEW OF SYSTEMS: Mr. Hardrick does not have any significant problems involving his other body systems.  PHYSICAL EXAM: BP 118/70   Pulse 90   Wt 205 lb 6.4 oz (93.2 kg)   BMI 27.86 kg/m     Constitutional: Kemuel looks healthy, but still overweight. He has not had any weight change since his last visit. He appears physically and emotionally well. His affect, engagement, and insight are normal.  Eyes: There is no arcus or proptosis.  Mouth: The oropharynx appears normal. The tongue appears normal. There is normal oral moisture. There is no obvious gingivitis. Neck: The thyroid gland appears slightly enlarged. There are no bruits present. The thyroid gland is again enlarged at 22 grams in size. The right lobe is slightly enlarged. The left lobe is larger. The consistency of the thyroid gland is normal. The thyroid gland is again tender to palpation in the left mid-lobe today,but also somewhat tender in the right lobe.    Lungs: The lungs are clear. Air movement is good. Heart: The heart rhythm and rate appear normal. Heart sounds S1 and S2 are normal. I do not appreciate any pathologic heart murmurs. Abdomen: The abdomen is enlarged. Bowel sounds are  normal. The abdomen is soft and non-tender. There is no obviously palpable hepatomegaly, splenomegaly, or other masses.  Arms: Muscle mass appears appropriate for age.  Hands: There is no obvious tremor. Phalangeal and metacarpophalangeal joints appear normal. His rash has resolved.  Legs: Muscle mass appears appropriate for age. There is no edema.  Feet: There are no significant deformities. Dorsalis pedis pulses are 2+ bilaterally. His feet look good. He has no obvious tinea pedis of the feet today.  Neurologic: Muscle strength is normal for age and gender in both the upper and the lower extremities. Muscle tone appears normal. Sensation to touch is normal in the legs, but slightly decreased in his left heel today.     LABS:   Labs 06/15/17: HbA1c 7.1%, CBG 198  Labs 05/26/17: TSH 1.68, free T4 1.0, free T3 3.1; CMP normal; cholesterol 134, triglycerides 71, HDL 68, LDL 51; urinary microalbumin/creatinine ratio 3; serum amylase 133 (ref 21-101)  Labs 01/24/17: HbA1c 6.7%, CBG 99  Labs 10/27/16: HbA1c 8.6%,CBG 174  Labs 10/25/16: CMP normal, except for glucose 223; HIV antibody non-reactive  Labs 07/16/16: HbA1c 8.6%   Labs 06/25/16: Surveillance labs for hepatology: HIV negative; Amylase 155 (ref 0-105), lipase 34 (ref 7-60); CBC normal; CK 71 (ref 7-232); CMP normal except glucose 293; phosphorus 2.6 (ref 2.5-4.5)  Labs 03/31/16: HbA1c 8.4%; TSH 1.07, free T4 1.1, free T3 3.4; cholesterol 162, triglycerides 49, HDL 86, LDL 66; urinary microalbumin/creatinine ratio 1; CMP normal, except for glucose 173;   Labs 12/30/15: HbA1c 8.1%  Labs 10/01/15: HbA1c 7.8%  Labs 11/18/14; HbA1c 8.9%, compared with 7.7% at last visit and with 7.0% at the visit prior. CMP normal except for glucose of 173, normal AST and ALT; cholesterol 161, triglycerides 8, HDL 67, LDL 78; urinary microalbumin/creatinine ratio 2.5; TSH 1.357, free T4 1.04, free T3 3.0  Labs 12/06/12: CMP with normal AST and ALT. Cholesterol  136, triglycerides 74, HDL 47 (decreased from 58 when he was exercising), LDL 74; microalbumin/creatinine ratio 2.3; TSH 1.782, free T4 0.98, free T3 3.1  Labs 02/03/12: CMP: Normal; TSH 1.565, free T4 0.98, free T3 3.0; cholesterol 156, triglycerides 125, HDL 58, LDL 73; Urinary microalbumin/creatinine ratio 4.2.  ASSESSMENT:  1. T1DM:  A. His BG control is much better in terms of his average BG. He is still having many low SGs, but no low BGs..    B. All of his higher BGs occurred late on the fifth days of his sites. There are still many glucose spikes that are probably diet-related. He needs to be more consistent with his diet, with his exercise regimen, and especially with changing sites every 3 days.. 2. Hypoglycemia:   A. He is still having too many low SGs.   B. Because he is not changing sites often enough, there are often more hectic swings of BG.  3. Thyroiditis: His thyroiditis is clinically active today in both lobes. 4. Goiter: The thyroid gland has not changed in size since his last visit. The previous waxing and waning of thyroid gland size is c/w evolving Hashimoto's disease.  He was euthyroid in March 2014, February 2016, July 2017, and in September 2018.   5. Hypertension: His BP is good today. He needs to continue to exercise and to lose fat  weight.  6. Hyperlipidemia: Lipid panel results in March 2014, February 2016, July 2017, and September 2018 were good overall.  7. Autonomic neuropathy, tachycardia, and gastroparesis: With the worsening of BG control he had had in the past, his neuropathy, tachycardia, and gastroparesis worsened in parallel. He is doing progressively better now in all of his autonomic symptoms.   8. Peripheral neuropathy: His neuropathy is very mild today.   9. Adjustment reaction: He is doing very well today.  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 I had not seen these values before his last visit because they were part of a research study being conducted by the  [facility], but in reviewing his labs his amylase values have consistently been in the 150s-250s for many years. He thinks that his amylase levels were high even before he started Interferon therapy. His LFTs and total bilirubin are quite normal.   B. I had asked Trey Paula to talk with his hepatologist about this issue. The hepatologist thought that these values were related to his diabetes. That is not a complication of DM that I recognize. I still do not know why the amylase is elevated, but the amylase has been lower recently.   PLAN:  1. Diagnostic: HbA1c and CBG today. Call Dr. Fransico Chanese Hartsough in one month to discuss BGs.  2. Therapeutic:   A. Do not do correction boluses while in auto mode. Allow the pump-sensor system to correct BGs. Change pump sites every three days. Use Lamisil or other anti-fungal topical agent for tinea pedis. Continue enalapril and atorvastatin. Eat right. Exercise right.   B. Continue current pump basal rate settings.  MN: 0.925 3 AM: 1.45 4:30 AM:  1.50 8 AM: 1.25 12:00 noon: 0.90 5 PM: 0.825 7 PM: 1.20  C. New ICRs; MN: 9 New 11 AM: 8 9 PM: 9 3. Patient education: We again discussed life style issues and pump-sensor issues. I asked him again to change his sites every 3 days. He committed to trying to do so. 4. Follow up: FU appointment in 3 months.  Level of Service: This visit lasted in excess of 55 minutes. More than 50% of the visit was devoted to counseling.  Molli Knock. MD, CDE Adult and Pediatric Endocrinology

## 2017-06-15 NOTE — Patient Instructions (Signed)
Follow up visit in 3 months. 

## 2017-07-11 ENCOUNTER — Encounter (INDEPENDENT_AMBULATORY_CARE_PROVIDER_SITE_OTHER): Payer: BC Managed Care – PPO | Admitting: *Deleted

## 2017-07-11 VITALS — BP 122/79 | HR 75 | Temp 98.2°F | Wt 205.0 lb

## 2017-07-11 DIAGNOSIS — Z006 Encounter for examination for normal comparison and control in clinical research program: Secondary | ICD-10-CM

## 2017-07-12 LAB — CBC WITH DIFFERENTIAL/PLATELET
Basophils Absolute: 21 cells/uL (ref 0–200)
Basophils Relative: 0.4 %
Eosinophils Absolute: 53 cells/uL (ref 15–500)
Eosinophils Relative: 1 %
HCT: 42.6 % (ref 38.5–50.0)
Hemoglobin: 13.9 g/dL (ref 13.2–17.1)
Lymphs Abs: 3297 cells/uL (ref 850–3900)
MCH: 29.7 pg (ref 27.0–33.0)
MCHC: 32.6 g/dL (ref 32.0–36.0)
MCV: 91 fL (ref 80.0–100.0)
MPV: 10.5 fL (ref 7.5–12.5)
Monocytes Relative: 8.2 %
Neutro Abs: 1495 cells/uL — ABNORMAL LOW (ref 1500–7800)
Neutrophils Relative %: 28.2 %
Platelets: 218 10*3/uL (ref 140–400)
RBC: 4.68 10*6/uL (ref 4.20–5.80)
RDW: 12.9 % (ref 11.0–15.0)
Total Lymphocyte: 62.2 %
WBC mixed population: 435 cells/uL (ref 200–950)
WBC: 5.3 10*3/uL (ref 3.8–10.8)

## 2017-07-12 LAB — COMPREHENSIVE METABOLIC PANEL
AG Ratio: 1.8 (calc) (ref 1.0–2.5)
ALT: 23 U/L (ref 9–46)
AST: 22 U/L (ref 10–40)
Albumin: 4.4 g/dL (ref 3.6–5.1)
Alkaline phosphatase (APISO): 61 U/L (ref 40–115)
BUN: 9 mg/dL (ref 7–25)
CO2: 33 mmol/L — ABNORMAL HIGH (ref 20–32)
Calcium: 9.7 mg/dL (ref 8.6–10.3)
Chloride: 101 mmol/L (ref 98–110)
Creat: 1.18 mg/dL (ref 0.60–1.35)
Globulin: 2.4 g/dL (calc) (ref 1.9–3.7)
Glucose, Bld: 145 mg/dL — ABNORMAL HIGH (ref 65–99)
Potassium: 4.3 mmol/L (ref 3.5–5.3)
Sodium: 138 mmol/L (ref 135–146)
Total Bilirubin: 0.7 mg/dL (ref 0.2–1.2)
Total Protein: 6.8 g/dL (ref 6.1–8.1)

## 2017-07-12 LAB — HIV ANTIBODY (ROUTINE TESTING W REFLEX): HIV: NONREACTIVE

## 2017-07-12 LAB — AMYLASE: AMYLASE: 144 U/L — AB (ref 21–101)

## 2017-07-12 LAB — PHOSPHORUS: Phosphorus: 3.2 mg/dL (ref 2.5–4.5)

## 2017-07-12 LAB — CK: Total CK: 114 U/L (ref 44–196)

## 2017-07-12 LAB — LIPASE: Lipase: 42 U/L (ref 7–60)

## 2017-07-13 NOTE — Progress Notes (Signed)
Study: A Phase 2b/3 Double Blind Safety and Efficacy Study of Injectable Cabotegravir compared to Daily Oral Tenofovir Disoproxil Fumarate/Emtricitabine (TDF/FTC), For Pre-Exposure Prophylaxis in HIV-Uninfected Cisgender Men and Transgender Women who have sex with Men.  Medication: Investigational Injectable Cabotegravir/placebo compared to Truvada/placebo. Duration: Around 4 years.  Samuel Valentine is here for week 65. Samuel Valentine denies any issues. Assessment revealed no new findings. Samuel Valentine returned #47 pills of study product. Questionnaires completed. HIV rapid confirmed non-reactive. Injection given in right gluteal muscle with no problems. Oral study product dispensed. Samuel Valentine received $50 gift card for visit. Next appointment scheduled for 11/5.

## 2017-07-25 ENCOUNTER — Encounter (INDEPENDENT_AMBULATORY_CARE_PROVIDER_SITE_OTHER): Payer: BC Managed Care – PPO | Admitting: *Deleted

## 2017-07-25 VITALS — BP 107/71 | HR 80 | Temp 98.1°F | Wt 214.5 lb

## 2017-07-25 DIAGNOSIS — Z006 Encounter for examination for normal comparison and control in clinical research program: Secondary | ICD-10-CM

## 2017-07-25 NOTE — Progress Notes (Signed)
Samuel PaulaJeff is here for his week 67 visit for HPTN. He denies any injection site reactions or problems with his medications. He does say that he has been having pain in his left hip for the past 2 months that comes and goes.   He just got back from a cruise in the [place] and did a lot of walking which may be agrravating it. He will be returning in December for the next injection visit.

## 2017-07-26 LAB — COMPREHENSIVE METABOLIC PANEL
AG RATIO: 1.8 (calc) (ref 1.0–2.5)
ALKALINE PHOSPHATASE (APISO): 90 U/L (ref 40–115)
ALT: 35 U/L (ref 9–46)
AST: 30 U/L (ref 10–40)
Albumin: 4.5 g/dL (ref 3.6–5.1)
BILIRUBIN TOTAL: 0.6 mg/dL (ref 0.2–1.2)
BUN: 10 mg/dL (ref 7–25)
CALCIUM: 9.2 mg/dL (ref 8.6–10.3)
CO2: 33 mmol/L — AB (ref 20–32)
Chloride: 98 mmol/L (ref 98–110)
Creat: 1.27 mg/dL (ref 0.60–1.35)
Globulin: 2.5 g/dL (calc) (ref 1.9–3.7)
Glucose, Bld: 206 mg/dL — ABNORMAL HIGH (ref 65–99)
Potassium: 4.6 mmol/L (ref 3.5–5.3)
SODIUM: 135 mmol/L (ref 135–146)
TOTAL PROTEIN: 7 g/dL (ref 6.1–8.1)

## 2017-07-26 LAB — CBC WITH DIFFERENTIAL/PLATELET
BASOS PCT: 0.3 %
Basophils Absolute: 18 cells/uL (ref 0–200)
EOS ABS: 42 {cells}/uL (ref 15–500)
EOS PCT: 0.7 %
HCT: 39.2 % (ref 38.5–50.0)
HEMOGLOBIN: 13.3 g/dL (ref 13.2–17.1)
Lymphs Abs: 3186 cells/uL (ref 850–3900)
MCH: 31.1 pg (ref 27.0–33.0)
MCHC: 33.9 g/dL (ref 32.0–36.0)
MCV: 91.8 fL (ref 80.0–100.0)
MONOS PCT: 5.6 %
MPV: 10.3 fL (ref 7.5–12.5)
NEUTROS ABS: 2418 {cells}/uL (ref 1500–7800)
Neutrophils Relative %: 40.3 %
PLATELETS: 221 10*3/uL (ref 140–400)
RBC: 4.27 10*6/uL (ref 4.20–5.80)
RDW: 12.5 % (ref 11.0–15.0)
Total Lymphocyte: 53.1 %
WBC mixed population: 336 cells/uL (ref 200–950)
WBC: 6 10*3/uL (ref 3.8–10.8)

## 2017-07-26 LAB — CK: CK TOTAL: 143 U/L (ref 44–196)

## 2017-07-26 LAB — LIPASE: LIPASE: 10 U/L (ref 7–60)

## 2017-07-26 LAB — PHOSPHORUS: PHOSPHORUS: 3 mg/dL (ref 2.5–4.5)

## 2017-07-26 LAB — HIV ANTIBODY (ROUTINE TESTING W REFLEX): HIV 1&2 Ab, 4th Generation: NONREACTIVE

## 2017-07-26 LAB — AMYLASE: AMYLASE: 112 U/L — AB (ref 21–101)

## 2017-08-02 ENCOUNTER — Encounter: Payer: Self-pay | Admitting: Family Medicine

## 2017-08-02 ENCOUNTER — Ambulatory Visit: Payer: BC Managed Care – PPO | Admitting: Family Medicine

## 2017-08-02 VITALS — BP 124/70 | HR 88 | Temp 98.2°F | Resp 16 | Wt 215.8 lb

## 2017-08-02 DIAGNOSIS — J0140 Acute pansinusitis, unspecified: Secondary | ICD-10-CM | POA: Diagnosis not present

## 2017-08-02 MED ORDER — DOXYCYCLINE HYCLATE 100 MG PO TABS: 100.0000 mg | ORAL_TABLET | Freq: Two times a day (BID) | ORAL | 0 refills | Status: DC

## 2017-08-02 NOTE — Patient Instructions (Signed)
Call if you are not back to normal after completing the antibiotic.     Sinusitis, Adult Sinusitis is soreness and inflammation of your sinuses. Sinuses are hollow spaces in the bones around your face. Your sinuses are located:  Around your eyes.  In the middle of your forehead.  Behind your nose.  In your cheekbones.  Your sinuses and nasal passages are lined with a stringy fluid (mucus). Mucus normally drains out of your sinuses. When your nasal tissues become inflamed or swollen, the mucus can become trapped or blocked so air cannot flow through your sinuses. This allows bacteria, viruses, and funguses to grow, which leads to infection. Sinusitis can develop quickly and last for 7?10 days (acute) or for more than 12 weeks (chronic). Sinusitis often develops after a cold. What are the causes? This condition is caused by anything that creates swelling in the sinuses or stops mucus from draining, including:  Allergies.  Asthma.  Bacterial or viral infection.  Abnormally shaped bones between the nasal passages.  Nasal growths that contain mucus (nasal polyps).  Narrow sinus openings.  Pollutants, such as chemicals or irritants in the air.  A foreign object stuck in the nose.  A fungal infection. This is rare.  What increases the risk? The following factors may make you more likely to develop this condition:  Having allergies or asthma.  Having had a recent cold or respiratory tract infection.  Having structural deformities or blockages in your nose or sinuses.  Having a weak immune system.  Doing a lot of swimming or diving.  Overusing nasal sprays.  Smoking.  What are the signs or symptoms? The main symptoms of this condition are pain and a feeling of pressure around the affected sinuses. Other symptoms include:  Upper toothache.  Earache.  Headache.  Bad breath.  Decreased sense of smell and taste.  A cough that may get worse at  night.  Fatigue.  Fever.  Thick drainage from your nose. The drainage is often green and it may contain pus (purulent).  Stuffy nose or congestion.  Postnasal drip. This is when extra mucus collects in the throat or back of the nose.  Swelling and warmth over the affected sinuses.  Sore throat.  Sensitivity to light.  How is this diagnosed? This condition is diagnosed based on symptoms, a medical history, and a physical exam. To find out if your condition is acute or chronic, your health care provider may:  Look in your nose for signs of nasal polyps.  Tap over the affected sinus to check for signs of infection.  View the inside of your sinuses using an imaging device that has a light attached (endoscope).  If your health care provider suspects that you have chronic sinusitis, you may also:  Be tested for allergies.  Have a sample of mucus taken from your nose (nasal culture) and checked for bacteria.  Have a mucus sample examined to see if your sinusitis is related to an allergy.  If your sinusitis does not respond to treatment and it lasts longer than 8 weeks, you may have an MRI or CT scan to check your sinuses. These scans also help to determine how severe your infection is. In rare cases, a bone biopsy may be done to rule out more serious types of fungal sinus disease. How is this treated? Treatment for sinusitis depends on the cause and whether your condition is chronic or acute. If a virus is causing your sinusitis, your symptoms will go  away on their own within 10 days. You may be given medicines to relieve your symptoms, including:  Topical nasal decongestants. They shrink swollen nasal passages and let mucus drain from your sinuses.  Antihistamines. These drugs block inflammation that is triggered by allergies. This can help to ease swelling in your nose and sinuses.  Topical nasal corticosteroids. These are nasal sprays that ease inflammation and swelling in  your nose and sinuses.  Nasal saline washes. These rinses can help to get rid of thick mucus in your nose.  If your condition is caused by bacteria, you will be given an antibiotic medicine. If your condition is caused by a fungus, you will be given an antifungal medicine. Surgery may be needed to correct underlying conditions, such as narrow nasal passages. Surgery may also be needed to remove polyps. Follow these instructions at home: Medicines  Take, use, or apply over-the-counter and prescription medicines only as told by your health care provider. These may include nasal sprays.  If you were prescribed an antibiotic medicine, take it as told by your health care provider. Do not stop taking the antibiotic even if you start to feel better. Hydrate and Humidify  Drink enough water to keep your urine clear or pale yellow. Staying hydrated will help to thin your mucus.  Use a cool mist humidifier to keep the humidity level in your home above 50%.  Inhale steam for 10-15 minutes, 3-4 times a day or as told by your health care provider. You can do this in the bathroom while a hot shower is running.  Limit your exposure to cool or dry air. Rest  Rest as much as possible.  Sleep with your head raised (elevated).  Make sure to get enough sleep each night. General instructions  Apply a warm, moist washcloth to your face 3-4 times a day or as told by your health care provider. This will help with discomfort.  Wash your hands often with soap and water to reduce your exposure to viruses and other germs. If soap and water are not available, use hand sanitizer.  Do not smoke. Avoid being around people who are smoking (secondhand smoke).  Keep all follow-up visits as told by your health care provider. This is important. Contact a health care provider if:  You have a fever.  Your symptoms get worse.  Your symptoms do not improve within 10 days. Get help right away if:  You have a  severe headache.  You have persistent vomiting.  You have pain or swelling around your face or eyes.  You have vision problems.  You develop confusion.  Your neck is stiff.  You have trouble breathing. This information is not intended to replace advice given to you by your health care provider. Make sure you discuss any questions you have with your health care provider. Document Released: 09/06/2005 Document Revised: 05/02/2016 Document Reviewed: 07/02/2015 Elsevier Interactive Patient Education  2017 [employer].

## 2017-08-02 NOTE — Progress Notes (Signed)
Chief Complaint  Patient presents with  . sinus infection    possible sinus infection- yellow/pusy snot, cough, sinus pressure, tried otc meds but nothing has helped    Subjective:  Samuel DamesJeffrey Schueller is a 46 y.o. male who presents for possible sinus infection.  Symptoms include a 5 day history of sore throat, hoarseness, thick, yellowish nasal drainage and sinus pressure including upper tooth pain. Symptoms were an acute onset. His husband has been sick as well and recently started on antibiotics.  Denies fever, chills, body aches, chest pain, palpitations, shortness of breath, wheezing, abdominal pain, N/V/D.  He has type 1 DM and has a pump. His current blood sugar is 150, 45 minutes after a meal.   Past history is significant for no history of pneumonia or bronchitis. Patient is a non-smoker.  Using Zicam for symptoms.  Denies sick contacts.  No other aggravating or relieving factors.  No other c/o.  ROS as in subjective   Objective: Vitals:   08/02/17 1329  BP: 124/70  Pulse: 88  Resp: 16  Temp: 98.2 F (36.8 C)  SpO2: 98%    General appearance: Alert, WD/WN, no distress                             Skin: warm, no rash                           Head: + frontal and maxillary sinus tenderness,                            Eyes: conjunctiva normal, corneas clear, PERRLA                            Ears: pearly TMs, external ear canals normal                          Nose: septum deviated signficantly, turbinates swollen, with erythema and thick discharge             Mouth/throat: MMM, tongue normal, mild pharyngeal erythema                           Neck: supple, no adenopathy, no thyromegaly, nontender                          Heart: RRR, normal S1, S2, no murmurs                         Lungs: CTA bilaterally, no wheezes, rales, or rhonchi       Assessment and Plan: Acute non-recurrent pansinusitis - Plan: doxycycline (VIBRA-TABS) 100 MG tablet    Prescription sent for  Doxycycline (penicillin allergy).  Can use OTC Mucinex for congestion.  Tylenol or Ibuprofen OTC for fever and malaise.  Discussed symptomatic relief, nasal saline flush, and call or return if worse or not back to baseline after completing the antibiotic.

## 2017-09-05 ENCOUNTER — Encounter (INDEPENDENT_AMBULATORY_CARE_PROVIDER_SITE_OTHER): Payer: BC Managed Care – PPO | Admitting: *Deleted

## 2017-09-05 VITALS — BP 96/62 | HR 66 | Temp 97.5°F | Wt 205.0 lb

## 2017-09-05 DIAGNOSIS — Z006 Encounter for examination for normal comparison and control in clinical research program: Secondary | ICD-10-CM

## 2017-09-05 NOTE — Progress Notes (Signed)
Study: A Phase 2b/3 Double Blind Safety and Efficacy Study of Injectable Cabotegravir compared to Daily Oral Tenofovir Disoproxil Fumarate/Emtricitabine (TDF/FTC), For Pre-Exposure Prophylaxis in HIV-Uninfected Cisgender Men and Transgender Women who have sex with Men.  Medication: Investigational Injectable Cabotegravir/placebo compared to Truvada/placebo. Duration: Around 4 years.  Samuel Valentine is here for week 73. He denies any new issues. Still continues to have pain in left hip. HIV rapid confirmed non-reactive. Questionnaires completed. He did not bring his oral study product but plans to at next appointment. Injection given in left gluteal muscle. Oral study product dispensed. He received $50 gift card for visit. Next appointment scheduled in January

## 2017-09-06 LAB — CBC WITH DIFFERENTIAL/PLATELET
BASOS ABS: 10 {cells}/uL (ref 0–200)
Basophils Relative: 0.2 %
EOS ABS: 57 {cells}/uL (ref 15–500)
EOS PCT: 1.1 %
HEMATOCRIT: 44 % (ref 38.5–50.0)
Hemoglobin: 15 g/dL (ref 13.2–17.1)
Lymphs Abs: 2850 cells/uL (ref 850–3900)
MCH: 31.3 pg (ref 27.0–33.0)
MCHC: 34.1 g/dL (ref 32.0–36.0)
MCV: 91.9 fL (ref 80.0–100.0)
MONOS PCT: 5.9 %
MPV: 10.6 fL (ref 7.5–12.5)
NEUTROS PCT: 38 %
Neutro Abs: 1976 cells/uL (ref 1500–7800)
Platelets: 207 10*3/uL (ref 140–400)
RBC: 4.79 10*6/uL (ref 4.20–5.80)
RDW: 12.5 % (ref 11.0–15.0)
Total Lymphocyte: 54.8 %
WBC mixed population: 307 cells/uL (ref 200–950)
WBC: 5.2 10*3/uL (ref 3.8–10.8)

## 2017-09-06 LAB — COMPREHENSIVE METABOLIC PANEL
AG RATIO: 1.7 (calc) (ref 1.0–2.5)
ALKALINE PHOSPHATASE (APISO): 63 U/L (ref 40–115)
ALT: 20 U/L (ref 9–46)
AST: 19 U/L (ref 10–40)
Albumin: 4.3 g/dL (ref 3.6–5.1)
BUN: 12 mg/dL (ref 7–25)
CHLORIDE: 100 mmol/L (ref 98–110)
CO2: 32 mmol/L (ref 20–32)
Calcium: 9.7 mg/dL (ref 8.6–10.3)
Creat: 1.16 mg/dL (ref 0.60–1.35)
GLUCOSE: 237 mg/dL — AB (ref 65–99)
Globulin: 2.5 g/dL (calc) (ref 1.9–3.7)
Potassium: 4.5 mmol/L (ref 3.5–5.3)
Sodium: 137 mmol/L (ref 135–146)
Total Bilirubin: 0.5 mg/dL (ref 0.2–1.2)
Total Protein: 6.8 g/dL (ref 6.1–8.1)

## 2017-09-06 LAB — HIV ANTIBODY (ROUTINE TESTING W REFLEX): HIV 1&2 Ab, 4th Generation: NONREACTIVE

## 2017-09-06 LAB — AMYLASE: AMYLASE: 148 U/L — AB (ref 21–101)

## 2017-09-06 LAB — LIPASE: LIPASE: 18 U/L (ref 7–60)

## 2017-09-06 LAB — CK: CK TOTAL: 86 U/L (ref 44–196)

## 2017-09-06 LAB — PHOSPHORUS: Phosphorus: 3.3 mg/dL (ref 2.5–4.5)

## 2017-09-19 ENCOUNTER — Ambulatory Visit (INDEPENDENT_AMBULATORY_CARE_PROVIDER_SITE_OTHER): Payer: BC Managed Care – PPO | Admitting: "Endocrinology

## 2017-09-19 ENCOUNTER — Encounter (INDEPENDENT_AMBULATORY_CARE_PROVIDER_SITE_OTHER): Payer: Self-pay | Admitting: "Endocrinology

## 2017-09-19 VITALS — BP 118/76 | HR 90 | Ht 72.05 in | Wt 207.6 lb

## 2017-09-19 DIAGNOSIS — E1065 Type 1 diabetes mellitus with hyperglycemia: Secondary | ICD-10-CM | POA: Diagnosis not present

## 2017-09-19 DIAGNOSIS — I10 Essential (primary) hypertension: Secondary | ICD-10-CM

## 2017-09-19 DIAGNOSIS — R748 Abnormal levels of other serum enzymes: Secondary | ICD-10-CM | POA: Diagnosis not present

## 2017-09-19 DIAGNOSIS — E1042 Type 1 diabetes mellitus with diabetic polyneuropathy: Secondary | ICD-10-CM

## 2017-09-19 DIAGNOSIS — E049 Nontoxic goiter, unspecified: Secondary | ICD-10-CM | POA: Diagnosis not present

## 2017-09-19 DIAGNOSIS — E063 Autoimmune thyroiditis: Secondary | ICD-10-CM

## 2017-09-19 DIAGNOSIS — IMO0001 Reserved for inherently not codable concepts without codable children: Secondary | ICD-10-CM

## 2017-09-19 DIAGNOSIS — E10649 Type 1 diabetes mellitus with hypoglycemia without coma: Secondary | ICD-10-CM | POA: Diagnosis not present

## 2017-09-19 DIAGNOSIS — E1043 Type 1 diabetes mellitus with diabetic autonomic (poly)neuropathy: Secondary | ICD-10-CM | POA: Diagnosis not present

## 2017-09-19 LAB — POCT GLYCOSYLATED HEMOGLOBIN (HGB A1C): Hemoglobin A1C: 7.3

## 2017-09-19 LAB — POCT GLUCOSE (DEVICE FOR HOME USE): POC Glucose: 175 mg/dL — AB (ref 70–99)

## 2017-09-19 MED ORDER — ENALAPRIL MALEATE 5 MG PO TABS: 5.0000 mg | ORAL_TABLET | Freq: Two times a day (BID) | ORAL | 3 refills | Status: DC

## 2017-09-19 NOTE — Progress Notes (Signed)
CC: FU T1DM, hypoglycemia, hypertension, hyperlipidemia, autonomic neuropathy, tachycardia, gastroparesis, peripheral neuropathy, thyroiditis, goiter, hepatitis B, fatigue  HPI: Samuel Valentine is a 46 y.o. Caucasian gentleman. He was unaccompanied.   1. Samuel Valentine was diagnosed with T2DM about 2002-2003. He was treated with metformin, Avandia, and Amaryl, but BGs were not well controlled, so Lantus was begun in 2005. When it became apparent that he would need a multiple daily injection regimen with Lantus and Novolog, he was referred to me by Ms. Lenor CoffinAnn Clark, RN, CDE, of the [facility]. I saw him for the first time on 02/12/05. He had just been started on Novolog aspart insulin at meals (120/25/10 plan). His PMH included recurrent hypoglycemia, hypertension, dyslipidemia, and proteinuria. He was also allergic to penicillin. He had had oral surgery in the past, but no other surgery. His psychiatric diagnoses included depression, anxiety, and dissociative identity disorder. Social history included the fact that he was gay. All HIV tests had been negative through that time. He had recently been laid off and had no health insurance. In March 2006, his HbA1c had been 10.6%. On exam he had a goiter and mild peripheral neuropathy. 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 We accepted him as a charity patient and enrolled him in our Diabetes Survival Skills Program. He was converted to a Medtronic 508 insulin pump in August 2006.   2. During the next twelve  years his DM self-care efforts and HbA1c values waxed and waned, with A1c values ranging from 6.4%-9.2%. His worst BGs occurred when he was being treated with interferon for hepatitis B, from which he is now in remission. He has been gainfully employed at [employer], [employer], and again at [employer] for several years and has had health insurance. In 2011 we changed him to a Medtronic 723 (Revel) pump and  started him on the Medtronic CGM sensor. He later converted to a Medtronic 530G insulin pump. Since the introduction of CGM-pump therapy, his BGs have significantly improved when he has been consistent with his T1DM self-care. He started his new Medtronic 670G pump and Guardian 3 CGM on 12/07/16 and started in auto mode on 12/10/16. His BGs have been better since then.  3. His last [facility] visit was on 06/15/17. At that visit I increased his ICRs. He was supposed to call me in two weeks so that we could adjust his pump settings, but he did not.   A. In the interim he has been healthy. His left hip is much better. He thinks that using his elliptical machine, and walking to work less, have been helpful.    B. He has had much more stable BGs when he is in auto mode. He really likes auto mode.    C. He can exercise without having many low BGs if he starts his TBR 90 minutes before exercise. He uses his elliptical machine much more. He has been trying to eat better and is using the gym at his new housing complex more frequently.   D. He has gained 2 pounds since his last visit. He is trying to control his appetite in the mornings.    E. He has been taking enalapril, 10 mg once daily, 10 mg of atorvastatin once daily, Centrum Silver, and two 81 mg ASA per day.  F. His work situation and marriage are doing well.  He is not sure that the weight gain is all muscle. He suspects that he may  have been eating more over the holidays.  G. At [facility] in December 2017 he was pronounced cured and released from further follow up care.  4. Pertinent Review of Systems: Constitutional: The patient feels "pretty good". His job at [employer] has changed, hopefully for the better. Eyes: Vision is not as good. His last eye exam was in June 2017. There were no signs of diabetic eye disease. He has presbyopia, but no other problems. He has a follow up exam scheduled for 09/26/17.  Neck:  He has not  had any discomfort in his anterior neck since his last visit.  Heart: He has not had many problems with fast heart rate. Heart rate increases with exercise or other physical activity. He has not had any chest wall pains. He has no other complaints of irregular heat beats, chest pain, or chest pressure. Gastrointestinal: His reflux bothers him at times. He occasionally has constipation if he doesn't drink enough fluid and take enough fiber. He still has occasional abdominal bloating, usually after eating a lot of fresh vegetables. Hands: His hand rash has not recurred while using Lamisil or Lotrimin.  Legs: His left hip has been much better. Muscle mass and strength seem normal. There are no complaints of numbness, tingling, burning, or pain. No edema is noted.  Feet:  He continues to apply Lamisil or Lotrimin cream as a preventive measure about twice a week. There are no complaints of numbness, tingling, burning, or pain. No edema is noted. Hypoglycemia: He does not think that he is having as many low BGs. He is much more aware when his BGs trend downward now.  Psych: He has had some emotional stresses since his last visit, so is now seeing a therapist.  5 . BG printout: We only have data since 09/06/17. He changes sites every 4- 5 days. He checks BGs 2-5 times per day, mostly 3-4 times. The longer his sites remain in place, the higher the BGs tend to be. His BGs have been less variable overall, but with some exceptions. His one BG >300 occurred after breakfast one day when he had been out of auto mode for several hours. He has had no BGs <101. Some sites work better than others. His average BG is 167, compared with 172 and his last visit and with 187 at the prior visit. His BG range is 101-382, compared with 75-324, at his last visit and with 55 to >400 at his prior visit. His BG variability has continued to decrease.   6. CGM sensor readout: He has been in auto mode 97% of the time, compared with 96% at  his last visit. He has been in SG range 75% of the time, compared with 72% of the time. SGs are highest after lunch and after dinner, but not as high as at his last visit. Overall his sensor values and BG values correlate quite well. His average SG is 153, compared with 158 at his last visit. The sensor has identified 4 SGs <70, compared with 17 at his last visit.  His sensor values reflect his highly variable meal schedule, carb intakes, activity levels, bolusing, and stress levels. He still sometimes has problems counting carbs.   PAST MEDICAL, FAMILY, AND SOCIAL HISTORY:  1. 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 [place]  2. He has been using his elliptical machine more  frequently and walking less frequently.  3. Primary care provider: Dr. Sharlot Gowda  REVIEW OF SYSTEMS: Samuel Valentine does not have any significant problems involving his other body systems.  PHYSICAL EXAM: BP 118/76   Pulse 90   Ht 6' 0.05" (1.83 m)   Wt 207 lb 9.6 oz (94.2 kg)   BMI 28.12 kg/m     Constitutional: Samuel Valentine looks healthy, but still overweight. He has gained 2 pounds since his last visit. He appears physically and emotionally well. His affect, engagement, and insight are normal.  Eyes: There is no arcus or proptosis.  Mouth: The oropharynx appears normal. The tongue appears normal. There is normal oral moisture. There is no obvious gingivitis. Neck: The thyroid gland appears slightly enlarged. There are no bruits present. The thyroid gland is enlarged, but smaller,  at about 21-22 grams in size. The right lobe is slightly enlarged. The left lobe is larger. The consistency of the thyroid gland is normal. The thyroid gland is again tender to palpation bilaterally, but more in the left lobe.     Lungs: The lungs are clear. Air movement is good. Heart: The heart rhythm and rate appear normal. Heart sounds S1 and S2 are normal. I  do not appreciate any pathologic heart murmurs. Abdomen: The abdomen is enlarged. Bowel sounds are normal. The abdomen is soft and a bit tender diffusely. There is no obviously palpable hepatomegaly, splenomegaly, or other masses.  Arms: Muscle mass appears appropriate for age.  Hands: There is no obvious tremor. Phalangeal and metacarpophalangeal joints appear normal. His rash has resolved.  Legs: Muscle mass appears appropriate for age. There is no edema.  Feet: There are no significant deformities. Dorsalis pedis pulses are 2+ bilaterally. His feet look good. He has no obvious tinea pedis of the feet today.  Neurologic: Muscle strength is normal for age and gender in both the upper and the lower extremities. Muscle tone appears normal. Sensation to touch is normal in the legs and in the feet today.     LABS:   Labs 09/19/17: HbA1c 7.3%, CBG 198  Labs 09/05/17: phosphorus 3.3 (ref 2.5-4.5); HIV antibody non-reactive; lipase 18 (ref 7-60); amylase 148 (ref 21-101); CK 86 (ref 44-196); CMP normal, except glucose 237; CBC normal  Labs 06/15/17: HbA1c 7.1%, CBG 198  Labs 05/26/17: TSH 1.68, free T4 1.0, free T3 3.1; CMP normal; cholesterol 134, triglycerides 71, HDL 68, LDL 51; urinary microalbumin/creatinine ratio 3; serum amylase 133 (ref 21-101)  Labs 01/24/17: HbA1c 6.7%, CBG 99  Labs 10/27/16: HbA1c 8.6%,CBG 174  Labs 10/25/16: CMP normal, except for glucose 223; HIV antibody non-reactive  Labs 07/16/16: HbA1c 8.6%   Labs 06/25/16: Surveillance labs for hepatology: HIV negative; Amylase 155 (ref 0-105), lipase 34 (ref 7-60); CBC normal; CK 71 (ref 7-232); CMP normal except glucose 293; phosphorus 2.6 (ref 2.5-4.5)  Labs 03/31/16: HbA1c 8.4%; TSH 1.07, free T4 1.1, free T3 3.4; cholesterol 162, triglycerides 49, HDL 86, LDL 66; urinary microalbumin/creatinine ratio 1; CMP normal, except for glucose 173;   Labs 12/30/15: HbA1c 8.1%  Labs 10/01/15: HbA1c 7.8%  Labs 11/18/14; HbA1c 8.9%,  compared with 7.7% at last visit and with 7.0% at the visit prior. CMP normal except for glucose of 173, normal AST and ALT; cholesterol 161, triglycerides 8, HDL 67, LDL 78; urinary microalbumin/creatinine ratio 2.5; TSH 1.357, free T4 1.04, free T3 3.0  Labs 12/06/12: CMP with normal AST and ALT. Cholesterol 136, triglycerides 74, HDL 47 (decreased from 58 when he was  exercising), LDL 74; microalbumin/creatinine ratio 2.3; TSH 1.782, free T4 0.98, free T3 3.1  Labs 02/03/12: CMP: Normal; TSH 1.565, free T4 0.98, free T3 3.0; cholesterol 156, triglycerides 125, HDL 58, LDL 73; Urinary microalbumin/creatinine ratio 4.2.  ASSESSMENT:  1. T1DM:   A. His BG control is much better in terms of his average BG and in terms of not having too much hypoglycemia. The fact that his HbA1c is higher reflects the decrease in hypoglycemia, but also reflects some higher BGs due to sites going bad and incorrect carb counts.    B. Almost all of his higher BGs occurred late on the fifth days of his sites. He is not having as many post-prandial glucose spikes. He needs to be more consistent with his diet, with his exercise regimen, and especially with changing sites every 3 days.. 2. Hypoglycemia:   A. He is not having many low SGs.   B. When he plans to exercise more than usual, he may need to set a lower TBR. 3. Thyroiditis: His thyroiditis is clinically active today in both lobes. 4. Goiter: The thyroid gland has decreased in size a bit since his last visit. The previous waxing and waning of thyroid gland size is c/w evolving Hashimoto's disease.  He was euthyroid in March 2014, February 2016, July 2017, and in September 2018.   5. Hypertension: His BP is good today. He needs to continue to exercise and to lose fat  weight.  6. Hyperlipidemia: Lipid panel results in March 2014, February 2016, July 2017, and September 2018 were good overall.  7. Autonomic neuropathy, tachycardia, and gastroparesis: With the worsening of  BG control he had had in the past, his neuropathy, tachycardia, and gastroparesis worsened in parallel. He is doing progressively better now in all of his autonomic symptoms.   8. Peripheral neuropathy: His neuropathy is not evident today.   9. Adjustment reaction: He is doing very well today.  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 I had not seen these values until his last visit because they were part of a research study being conducted by the [facility], but in reviewing his labs his amylase values have consistently been in the 150s-250s for many years. He thinks that his amylase levels were high even before he started Interferon therapy. His LFTs and total bilirubin are quite normal.   B. I had asked Trey Paula to talk with his hepatologist about this issue. The hepatologist thought that these values were related to his diabetes. That is not a complication of DM that I recognize. I still do not know why the amylase is elevated, but the amylase has been higher recently.   PLAN:  1. Diagnostic: HbA1c and CBG today. Call Dr. Fransico Murat Rideout in one month to discuss BGs.  2. Therapeutic:   A. Allow the pump-sensor system to correct BGs. Change pump sites every three days. Use Lamisil or other anti-fungal topical agent for tinea pedis. Continue enalapril and atorvastatin. Eat right. Exercise right. Use TBRs for unusually high exercise levels.  B. Continue current pump basal rate settings.  MN: 0.925 3 AM: 1.45 4:30 AM:  1.50 8 AM: 1.25 12:00 noon: 0.90 5 PM: 0.825 7 PM: 1.20  C. New ICRs; MN: 9 11 AM: 8 ->7 9 PM: 9 3. Patient education: We again discussed life style issues and pump-sensor issues. I asked him again to change his sites every 3 days. He says that he will try to do so. 4. Follow up: FU appointment in 3 months.  Level of Service: This visit lasted in excess of 55 minutes. More than 50% of the visit was devoted to counseling.  Molli Knock. MD, CDE Adult and Pediatric Endocrinology

## 2017-09-19 NOTE — Patient Instructions (Signed)
Follow up visit in 3 months. 

## 2017-09-23 ENCOUNTER — Encounter (INDEPENDENT_AMBULATORY_CARE_PROVIDER_SITE_OTHER): Payer: BC Managed Care – PPO | Admitting: *Deleted

## 2017-09-23 VITALS — BP 130/85 | HR 68 | Temp 97.8°F | Wt 210.0 lb

## 2017-09-23 DIAGNOSIS — Z006 Encounter for examination for normal comparison and control in clinical research program: Secondary | ICD-10-CM

## 2017-09-23 NOTE — Progress Notes (Signed)
Study: A Phase 2b/3 Double Blind Safety and Efficacy Study of Injectable Cabotegravir compared to Daily Oral Tenofovir Disoproxil Fumarate/Emtricitabine (TDF/FTC), For Pre-Exposure Prophylaxis in HIV-Uninfected Cisgender Men and Transgender Women who have sex with Men.  Medication: Investigational Injectable Cabotegravir/placebo compared to Truvada/placebo. Duration: Around 4 years.  Samuel Valentine is here for week 75. He denies any changes and had no injection site reaction. Blood drawn and HIV rapid is non-reactive. He received $50 gift card for visit. Will see him 2/18 for next injection.

## 2017-09-24 LAB — COMPREHENSIVE METABOLIC PANEL
AG Ratio: 1.8 (calc) (ref 1.0–2.5)
ALKALINE PHOSPHATASE (APISO): 74 U/L (ref 40–115)
ALT: 29 U/L (ref 9–46)
AST: 23 U/L (ref 10–40)
Albumin: 4.7 g/dL (ref 3.6–5.1)
BILIRUBIN TOTAL: 0.6 mg/dL (ref 0.2–1.2)
BUN: 11 mg/dL (ref 7–25)
CALCIUM: 9.8 mg/dL (ref 8.6–10.3)
CO2: 31 mmol/L (ref 20–32)
Chloride: 99 mmol/L (ref 98–110)
Creat: 1.13 mg/dL (ref 0.60–1.35)
Globulin: 2.6 g/dL (calc) (ref 1.9–3.7)
Glucose, Bld: 184 mg/dL — ABNORMAL HIGH (ref 65–99)
POTASSIUM: 4.2 mmol/L (ref 3.5–5.3)
Sodium: 137 mmol/L (ref 135–146)
Total Protein: 7.3 g/dL (ref 6.1–8.1)

## 2017-09-24 LAB — CBC WITH DIFFERENTIAL/PLATELET
BASOS ABS: 21 {cells}/uL (ref 0–200)
Basophils Relative: 0.4 %
EOS ABS: 58 {cells}/uL (ref 15–500)
Eosinophils Relative: 1.1 %
HCT: 43 % (ref 38.5–50.0)
Hemoglobin: 14.5 g/dL (ref 13.2–17.1)
Lymphs Abs: 2767 cells/uL (ref 850–3900)
MCH: 31 pg (ref 27.0–33.0)
MCHC: 33.7 g/dL (ref 32.0–36.0)
MCV: 91.9 fL (ref 80.0–100.0)
MONOS PCT: 7 %
MPV: 10.8 fL (ref 7.5–12.5)
NEUTROS PCT: 39.3 %
Neutro Abs: 2083 cells/uL (ref 1500–7800)
PLATELETS: 214 10*3/uL (ref 140–400)
RBC: 4.68 10*6/uL (ref 4.20–5.80)
RDW: 12.7 % (ref 11.0–15.0)
TOTAL LYMPHOCYTE: 52.2 %
WBC: 5.3 10*3/uL (ref 3.8–10.8)
WBCMIX: 371 {cells}/uL (ref 200–950)

## 2017-09-24 LAB — CK: CK TOTAL: 89 U/L (ref 44–196)

## 2017-09-24 LAB — AMYLASE: Amylase: 159 U/L — ABNORMAL HIGH (ref 21–101)

## 2017-09-24 LAB — LIPASE: Lipase: 14 U/L (ref 7–60)

## 2017-09-24 LAB — HIV ANTIBODY (ROUTINE TESTING W REFLEX): HIV: NONREACTIVE

## 2017-09-24 LAB — PHOSPHORUS: Phosphorus: 3.3 mg/dL (ref 2.5–4.5)

## 2017-10-17 LAB — HM DIABETES EYE EXAM

## 2017-10-19 ENCOUNTER — Encounter: Payer: Self-pay | Admitting: Family Medicine

## 2017-10-28 ENCOUNTER — Other Ambulatory Visit: Payer: Self-pay | Admitting: "Endocrinology

## 2017-11-07 ENCOUNTER — Encounter (INDEPENDENT_AMBULATORY_CARE_PROVIDER_SITE_OTHER): Payer: BC Managed Care – PPO | Admitting: *Deleted

## 2017-11-07 VITALS — BP 115/79 | HR 71 | Temp 98.2°F | Wt 209.5 lb

## 2017-11-07 DIAGNOSIS — Z006 Encounter for examination for normal comparison and control in clinical research program: Secondary | ICD-10-CM

## 2017-11-07 NOTE — Progress Notes (Signed)
Samuel Valentine is here for week 81 injection visit [facility] He denies any new problems except for a insulin pump malfunction that took him 56 hours to get replaced. He says he may have missed 1 or 2 pills since last visit, but forgot to bring his meds. An injection was given in his rt buttock without problem. He will be returning in 2 weeks for followup.

## 2017-11-08 LAB — COMPREHENSIVE METABOLIC PANEL
AG RATIO: 1.9 (calc) (ref 1.0–2.5)
ALKALINE PHOSPHATASE (APISO): 68 U/L (ref 40–115)
ALT: 25 U/L (ref 9–46)
AST: 24 U/L (ref 10–40)
Albumin: 4.5 g/dL (ref 3.6–5.1)
BUN: 10 mg/dL (ref 7–25)
CO2: 31 mmol/L (ref 20–32)
Calcium: 9.6 mg/dL (ref 8.6–10.3)
Chloride: 102 mmol/L (ref 98–110)
Creat: 1.26 mg/dL (ref 0.60–1.35)
Globulin: 2.4 g/dL (calc) (ref 1.9–3.7)
Glucose, Bld: 129 mg/dL — ABNORMAL HIGH (ref 65–99)
Potassium: 4.3 mmol/L (ref 3.5–5.3)
Sodium: 140 mmol/L (ref 135–146)
Total Bilirubin: 0.6 mg/dL (ref 0.2–1.2)
Total Protein: 6.9 g/dL (ref 6.1–8.1)

## 2017-11-08 LAB — CBC WITH DIFFERENTIAL/PLATELET
BASOS ABS: 22 {cells}/uL (ref 0–200)
Basophils Relative: 0.4 %
EOS ABS: 50 {cells}/uL (ref 15–500)
Eosinophils Relative: 0.9 %
HCT: 40.7 % (ref 38.5–50.0)
Hemoglobin: 14 g/dL (ref 13.2–17.1)
Lymphs Abs: 3086 cells/uL (ref 850–3900)
MCH: 31.6 pg (ref 27.0–33.0)
MCHC: 34.4 g/dL (ref 32.0–36.0)
MCV: 91.9 fL (ref 80.0–100.0)
MPV: 10.5 fL (ref 7.5–12.5)
Monocytes Relative: 7.6 %
Neutro Abs: 1925 cells/uL (ref 1500–7800)
Neutrophils Relative %: 35 %
PLATELETS: 209 10*3/uL (ref 140–400)
RBC: 4.43 10*6/uL (ref 4.20–5.80)
RDW: 12.9 % (ref 11.0–15.0)
TOTAL LYMPHOCYTE: 56.1 %
WBC mixed population: 418 cells/uL (ref 200–950)
WBC: 5.5 10*3/uL (ref 3.8–10.8)

## 2017-11-08 LAB — AMYLASE: AMYLASE: 228 U/L — AB (ref 21–101)

## 2017-11-08 LAB — PHOSPHORUS: PHOSPHORUS: 3.2 mg/dL (ref 2.5–4.5)

## 2017-11-08 LAB — LIPASE: LIPASE: 13 U/L (ref 7–60)

## 2017-11-08 LAB — HIV ANTIBODY (ROUTINE TESTING W REFLEX): HIV: NONREACTIVE

## 2017-11-08 LAB — RPR: RPR: NONREACTIVE

## 2017-11-08 LAB — C. TRACHOMATIS/N. GONORRHOEAE RNA
C. trachomatis RNA, TMA: NOT DETECTED
N. gonorrhoeae RNA, TMA: NOT DETECTED

## 2017-11-08 LAB — CK: CK TOTAL: 119 U/L (ref 44–196)

## 2017-11-09 LAB — CT/NG RNA, TMA RECTAL
CHLAMYDIA TRACHOMATIS RNA: NOT DETECTED
NEISSERIA GONORRHOEAE RNA: NOT DETECTED

## 2017-11-21 ENCOUNTER — Encounter (INDEPENDENT_AMBULATORY_CARE_PROVIDER_SITE_OTHER): Payer: BC Managed Care – PPO | Admitting: *Deleted

## 2017-11-21 VITALS — BP 107/71 | HR 68 | Temp 98.0°F | Wt 208.2 lb

## 2017-11-21 DIAGNOSIS — Z006 Encounter for examination for normal comparison and control in clinical research program: Secondary | ICD-10-CM

## 2017-11-21 NOTE — Progress Notes (Signed)
Samuel Valentine is here for his week 83 visit [facility]. He denies any new problems or any injection site reaction. He says his sugars have been managed smoothly with his new insulin pump. He will be returning in April for the next injection.

## 2017-11-22 LAB — CBC WITH DIFFERENTIAL/PLATELET
BASOS PCT: 0.6 %
Basophils Absolute: 32 cells/uL (ref 0–200)
Eosinophils Absolute: 42 cells/uL (ref 15–500)
Eosinophils Relative: 0.8 %
HCT: 42.7 % (ref 38.5–50.0)
HEMOGLOBIN: 14.6 g/dL (ref 13.2–17.1)
LYMPHS ABS: 2973 {cells}/uL (ref 850–3900)
MCH: 31.9 pg (ref 27.0–33.0)
MCHC: 34.2 g/dL (ref 32.0–36.0)
MCV: 93.2 fL (ref 80.0–100.0)
MPV: 10.4 fL (ref 7.5–12.5)
Monocytes Relative: 6.8 %
NEUTROS ABS: 1892 {cells}/uL (ref 1500–7800)
Neutrophils Relative %: 35.7 %
Platelets: 206 10*3/uL (ref 140–400)
RBC: 4.58 10*6/uL (ref 4.20–5.80)
RDW: 13 % (ref 11.0–15.0)
TOTAL LYMPHOCYTE: 56.1 %
WBC: 5.3 10*3/uL (ref 3.8–10.8)
WBCMIX: 360 {cells}/uL (ref 200–950)

## 2017-11-22 LAB — PHOSPHORUS: Phosphorus: 4 mg/dL (ref 2.5–4.5)

## 2017-11-22 LAB — COMPREHENSIVE METABOLIC PANEL
AG Ratio: 1.8 (calc) (ref 1.0–2.5)
ALBUMIN MSPROF: 4.5 g/dL (ref 3.6–5.1)
ALKALINE PHOSPHATASE (APISO): 64 U/L (ref 40–115)
ALT: 21 U/L (ref 9–46)
AST: 23 U/L (ref 10–40)
BUN: 15 mg/dL (ref 7–25)
CHLORIDE: 101 mmol/L (ref 98–110)
CO2: 31 mmol/L (ref 20–32)
CREATININE: 1.19 mg/dL (ref 0.60–1.35)
Calcium: 9.5 mg/dL (ref 8.6–10.3)
GLOBULIN: 2.5 g/dL (ref 1.9–3.7)
Glucose, Bld: 116 mg/dL — ABNORMAL HIGH (ref 65–99)
POTASSIUM: 4.7 mmol/L (ref 3.5–5.3)
Sodium: 139 mmol/L (ref 135–146)
Total Bilirubin: 0.4 mg/dL (ref 0.2–1.2)
Total Protein: 7 g/dL (ref 6.1–8.1)

## 2017-11-22 LAB — LIPASE: LIPASE: 18 U/L (ref 7–60)

## 2017-11-22 LAB — HIV ANTIBODY (ROUTINE TESTING W REFLEX): HIV 1&2 Ab, 4th Generation: NONREACTIVE

## 2017-11-22 LAB — AMYLASE: Amylase: 232 U/L — ABNORMAL HIGH (ref 21–101)

## 2017-11-22 LAB — CK: Total CK: 96 U/L (ref 44–196)

## 2017-12-16 ENCOUNTER — Ambulatory Visit (INDEPENDENT_AMBULATORY_CARE_PROVIDER_SITE_OTHER): Payer: Self-pay | Admitting: "Endocrinology

## 2017-12-19 ENCOUNTER — Ambulatory Visit: Payer: BC Managed Care – PPO | Admitting: Family Medicine

## 2017-12-19 ENCOUNTER — Encounter: Payer: Self-pay | Admitting: Family Medicine

## 2017-12-19 VITALS — BP 118/76 | HR 88 | Temp 98.1°F | Wt 204.4 lb

## 2017-12-19 DIAGNOSIS — J0140 Acute pansinusitis, unspecified: Secondary | ICD-10-CM

## 2017-12-19 DIAGNOSIS — E109 Type 1 diabetes mellitus without complications: Secondary | ICD-10-CM | POA: Diagnosis not present

## 2017-12-19 MED ORDER — AZITHROMYCIN 500 MG PO TABS: 500.0000 mg | ORAL_TABLET | Freq: Every day | ORAL | 0 refills | Status: DC

## 2017-12-19 NOTE — Patient Instructions (Signed)
Take all the antibiotic and if you are not back to normal in roughly 7-10 days call me

## 2017-12-19 NOTE — Progress Notes (Signed)
   Subjective:    Patient ID: Samuel Valentine, male    DOB: 07/17/1971, 47 y.o.   MRN: [id number]  HPI He complains of a 9-day history of started with sneezing and rhinorrhea followed by nasal congestion dry cough that became productive, PND, slight sore throat with occasional fever and chills.  He also complains of malaise and fatigue recently.  He is followed regularly by endocrine and does have a pump.  He is quite happy with this system.   Review of Systems     Objective:   Physical Exam Alert and in no distress.  Nasal mucosa is normal but he does have tenderness over frontal and maxillary sinuses tympanic membranes and canals are normal. Pharyngeal area is normal. Neck is supple without adenopathy or thyromegaly. Cardiac exam shows a regular sinus rhythm without murmurs or gallops. Lungs are clear to auscultation.        Assessment & Plan:  Acute non-recurrent pansinusitis - Plan: azithromycin (ZITHROMAX) 500 MG tablet  Type 1 diabetes mellitus without complication (HCC)  Azithromycin has worked well in the past for him.  I will therefore give it to him but recommend he call me in 7-10 days if not back to normal.

## 2018-01-02 ENCOUNTER — Encounter (INDEPENDENT_AMBULATORY_CARE_PROVIDER_SITE_OTHER): Payer: BC Managed Care – PPO | Admitting: *Deleted

## 2018-01-02 VITALS — BP 124/83 | HR 80 | Temp 98.3°F | Wt 207.0 lb

## 2018-01-02 DIAGNOSIS — Z006 Encounter for examination for normal comparison and control in clinical research program: Secondary | ICD-10-CM

## 2018-01-02 NOTE — Addendum Note (Signed)
Addended by: Phill MyronEPPERSON, Zanylah Hardie C on: 01/02/2018 12:39 PM   Modules accepted: Orders

## 2018-01-02 NOTE — Progress Notes (Signed)
Samuel PaulaJeff is here for his week 89 injection visit [facility]. He still has a leftover cough from a URI/sinus problem he had a few weeks ago. Other than that, his assessment is unchanged. An injection of study product was given in his left buttock. He forgot to bring his pills back with him for pill count, but will bring them at the next visit in 2 weeks.

## 2018-01-03 LAB — COMPREHENSIVE METABOLIC PANEL
AG RATIO: 1.6 (calc) (ref 1.0–2.5)
ALBUMIN MSPROF: 4.2 g/dL (ref 3.6–5.1)
ALT: 21 U/L (ref 9–46)
AST: 21 U/L (ref 10–40)
Alkaline phosphatase (APISO): 70 U/L (ref 40–115)
BILIRUBIN TOTAL: 0.7 mg/dL (ref 0.2–1.2)
BUN: 11 mg/dL (ref 7–25)
CALCIUM: 9.6 mg/dL (ref 8.6–10.3)
CHLORIDE: 99 mmol/L (ref 98–110)
CO2: 31 mmol/L (ref 20–32)
Creat: 1.21 mg/dL (ref 0.60–1.35)
Globulin: 2.7 g/dL (calc) (ref 1.9–3.7)
Glucose, Bld: 248 mg/dL — ABNORMAL HIGH (ref 65–99)
POTASSIUM: 4.8 mmol/L (ref 3.5–5.3)
SODIUM: 137 mmol/L (ref 135–146)
TOTAL PROTEIN: 6.9 g/dL (ref 6.1–8.1)

## 2018-01-03 LAB — CBC WITH DIFFERENTIAL/PLATELET
BASOS ABS: 19 {cells}/uL (ref 0–200)
Basophils Relative: 0.3 %
EOS PCT: 0.8 %
Eosinophils Absolute: 51 cells/uL (ref 15–500)
HEMATOCRIT: 40.5 % (ref 38.5–50.0)
Hemoglobin: 13.7 g/dL (ref 13.2–17.1)
LYMPHS ABS: 2944 {cells}/uL (ref 850–3900)
MCH: 31.3 pg (ref 27.0–33.0)
MCHC: 33.8 g/dL (ref 32.0–36.0)
MCV: 92.5 fL (ref 80.0–100.0)
MPV: 10.7 fL (ref 7.5–12.5)
Monocytes Relative: 5.1 %
NEUTROS PCT: 47.8 %
Neutro Abs: 3059 cells/uL (ref 1500–7800)
Platelets: 266 10*3/uL (ref 140–400)
RBC: 4.38 10*6/uL (ref 4.20–5.80)
RDW: 12.7 % (ref 11.0–15.0)
Total Lymphocyte: 46 %
WBC mixed population: 326 cells/uL (ref 200–950)
WBC: 6.4 10*3/uL (ref 3.8–10.8)

## 2018-01-03 LAB — HIV ANTIBODY (ROUTINE TESTING W REFLEX): HIV: NONREACTIVE

## 2018-01-03 LAB — CK: CK TOTAL: 75 U/L (ref 44–196)

## 2018-01-03 LAB — LIPASE: Lipase: 15 U/L (ref 7–60)

## 2018-01-03 LAB — AMYLASE: Amylase: 181 U/L — ABNORMAL HIGH (ref 21–101)

## 2018-01-03 LAB — PHOSPHORUS: PHOSPHORUS: 3.3 mg/dL (ref 2.5–4.5)

## 2018-01-05 ENCOUNTER — Ambulatory Visit (INDEPENDENT_AMBULATORY_CARE_PROVIDER_SITE_OTHER): Payer: BC Managed Care – PPO | Admitting: "Endocrinology

## 2018-01-05 ENCOUNTER — Encounter (INDEPENDENT_AMBULATORY_CARE_PROVIDER_SITE_OTHER): Payer: Self-pay | Admitting: "Endocrinology

## 2018-01-05 VITALS — BP 124/70 | HR 78 | Ht 71.89 in | Wt 202.6 lb

## 2018-01-05 DIAGNOSIS — E1043 Type 1 diabetes mellitus with diabetic autonomic (poly)neuropathy: Secondary | ICD-10-CM

## 2018-01-05 DIAGNOSIS — E7800 Pure hypercholesterolemia, unspecified: Secondary | ICD-10-CM | POA: Diagnosis not present

## 2018-01-05 DIAGNOSIS — R000 Tachycardia, unspecified: Secondary | ICD-10-CM | POA: Diagnosis not present

## 2018-01-05 DIAGNOSIS — E1065 Type 1 diabetes mellitus with hyperglycemia: Secondary | ICD-10-CM

## 2018-01-05 DIAGNOSIS — F4320 Adjustment disorder, unspecified: Secondary | ICD-10-CM | POA: Diagnosis not present

## 2018-01-05 DIAGNOSIS — I10 Essential (primary) hypertension: Secondary | ICD-10-CM | POA: Diagnosis not present

## 2018-01-05 DIAGNOSIS — IMO0001 Reserved for inherently not codable concepts without codable children: Secondary | ICD-10-CM

## 2018-01-05 DIAGNOSIS — E10649 Type 1 diabetes mellitus with hypoglycemia without coma: Secondary | ICD-10-CM

## 2018-01-05 DIAGNOSIS — E049 Nontoxic goiter, unspecified: Secondary | ICD-10-CM

## 2018-01-05 DIAGNOSIS — E1042 Type 1 diabetes mellitus with diabetic polyneuropathy: Secondary | ICD-10-CM

## 2018-01-05 DIAGNOSIS — E063 Autoimmune thyroiditis: Secondary | ICD-10-CM | POA: Diagnosis not present

## 2018-01-05 LAB — POCT GLUCOSE (DEVICE FOR HOME USE): POC Glucose: 148 mg/dl — AB (ref 70–99)

## 2018-01-05 LAB — POCT GLYCOSYLATED HEMOGLOBIN (HGB A1C): HEMOGLOBIN A1C: 7.3

## 2018-01-05 NOTE — Patient Instructions (Signed)
Follow up visit in 3 months. 

## 2018-01-05 NOTE — Progress Notes (Signed)
CC: FU T1DM, hypoglycemia, hypertension, hyperlipidemia, autonomic neuropathy, tachycardia, gastroparesis, peripheral neuropathy, thyroiditis, goiter, hepatitis B, fatigue  HPI: Samuel Valentine is a 47 y.o. Caucasian gentleman. He was unaccompanied.   1. [name] was diagnosed with T2DM about 2002-2003. He was treated with metformin, Avandia, and Amaryl, but BGs were not well controlled, so Lantus was begun in 2005. When it became apparent that he would need a multiple daily injection regimen with Lantus and Novolog, he was referred to me by [name], RN, CDE, of the [facility]. I saw him for the first time on 02/12/05. He had just been started on Novolog aspart insulin at meals (120/25/10 plan). His PMH included recurrent hypoglycemia, hypertension, dyslipidemia, and proteinuria. He was also allergic to penicillin. He had had oral surgery in the past, but no other surgery. His psychiatric diagnoses included depression, anxiety, and dissociative identity disorder. Social history included the fact that he was gay. All HIV tests had been negative through that time. He had recently been laid off and had no health insurance. In March 2006, his HbA1c had been 10.6%. On exam he had a goiter and mild peripheral neuropathy. 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 We accepted him as a charity patient and enrolled him in our Diabetes Survival Skills Program. He was converted to a Medtronic 508 insulin pump in August 2006.   2. During the next twelve  years his DM self-care efforts and HbA1c values waxed and waned, with A1c values ranging from 6.4%-9.2%. His worst BGs occurred when he was being treated with interferon for hepatitis B, from which he is now in remission. He has been gainfully employed at [employer], [employer], and again at [employer] for several years and has had health insurance. In 2011 we changed him to a Medtronic 723 (Revel) pump and  started him on the Medtronic CGM sensor. He later converted to a Medtronic 530G insulin pump. Since the introduction of CGM-pump therapy, his BGs have significantly improved when he has been consistent with his T1DM self-care. He started his new Medtronic 670G pump and Guardian 3 CGM on 12/07/16 and started in auto mode on 12/10/16. His BGs have been better since then.  3. His last [facility] visit was on 09/19/17. At that visit I increased his ICR at 11 AM. He thinks that that change did help.  He was supposed to call me in one month so that we could adjust his pump settings, but he did not.   A. In the interim he had been healthy until about 5 weeks ago when he developed sinusitis and bronchitis for which he was treated with azithromycin. He has also been bothered by the high pollen counts recently.   B. His left hip is much better. He thinks that using his elliptical machine, and walking to work less, have been helpful.    C. He has had stable BGs when he is in auto mode. He really likes auto mode. His new transmitter is working much better.   D. He can exercise without having many low BGs if he starts his TBR 90 minutes before exercise. He uses his elliptical machine, but not as much while he has been sick. He has been trying to eat better.   E. He has lost 5 pounds since his last visit.   F. He has been taking enalapril, 10 mg once daily, 10 mg of atorvastatin once daily, Centrum Silver, and two 81  mg ASA per day.  G. His work situation and marriage are doing well.   H. At his last visit at [facility] in 2018/01/02he was pronounced cured and released from further follow up care.  4. Pertinent Review of Systems: Constitutional: The patient feels "pretty good". His job at [employer] has changed, but is going well.  Eyes: Vision is not as good. His last eye exam was in January 2019. There were no signs of diabetic eye disease. He has presbyopia, but no other problems.  Neck:  He has not  had any discomfort in his anterior neck since his last visit.  Heart: He has had some increased heart rate when he feels stressed or upset. Heart rate increases with exercise or other physical activity. He has not had any chest wall pains. He has no other complaints of irregular heat beats, chest pain, or chest pressure. Gastrointestinal: His reflux bothers him at times, but not as much. He occasionally has constipation if he doesn't drink enough fluid and take enough fiber. He still has occasional abdominal bloating, usually after eating a lot of fresh vegetables. Hands: His hand rash has not recurred while using Lamisil or Lotrimin.  Legs: His left hip has been much better. Muscle mass and strength seem normal. There are no complaints of numbness, tingling, burning, or pain. No edema is noted.  Feet:  He continues to apply Lamisil or Lotrimin cream as a preventive measure about twice a week. He occasionally has some tingling under his right toes, but no other complaints of numbness, tingling, burning, or pain. No edema is noted. Hypoglycemia: He does not think that he is having many low BGs. He is much more aware when his BGs trend downward now.  Psych: He has had some emotional stresses since his last visit, so is seeing a therapist. He is handling things better.  5 . BG printout: He changes sites every 3-5 days. He checks BGs 2-7 times per day, mostly 3-4 times. The longer his sites remain in place, the higher the BGs tend to be. His BGs have been less variable overall, but with some exceptions. His one BG >300 occurred after lunch one day. He has had no BGs <95. Some sites work better than others. His average BG is 187, compared with 167, compared with 172 at the prior visit. His BG range is 95-342, compared with 101-382 at his last visit and with 75-324  at his prior visit. His BG variability has continued to decrease.   6. CGM sensor readout: He has worn his sensor 91% of the time. He has been in  auto mode 95% of the time, compared with 97% at his last visit. He has been in SG range 70% of the time, compared with 75% of the time. SGs are highest after lunch, but not as high. BGs are no longer high after dinner. Overall his sensor values and BG values correlate quite well. His average SG is 160, compared with 153 at his last visit and with 158 at his prior visit. The sensor has identified 3 SGs <70, compared with 4 at his last visit.  His sensor values reflect his highly variable meal schedule, carb intakes, activity levels, bolusing, and stress levels. He still sometimes has problems counting carbs.   PAST MEDICAL, FAMILY, AND SOCIAL HISTORY:  1. 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 He and his husband live in [place]. They were married in [address] [place].  2. He has not been using his elliptical machine as frequently or walking as frequently while he's been sick.  3. Primary care provider: Dr. Sharlot Gowda  REVIEW OF SYSTEMS: Mr. Aprea does not have any significant problems involving his other body systems.  PHYSICAL EXAM: BP 124/70   Pulse 78   Ht 5' 11.89" (1.826 m)   Wt 202 lb 9.6 oz (91.9 kg)   BMI 27.56 kg/m     Constitutional: Draken looks healthy, but still overweight. He has lost 5 pounds since his last visit. He appears physically and emotionally well. His affect, engagement, and insight are normal.  Eyes: There is no arcus or proptosis.  Mouth: The oropharynx appears normal. The tongue appears normal. There is normal oral moisture. There is no obvious gingivitis. Neck: The thyroid gland appears slightly enlarged. There are no bruits present. The thyroid gland is enlarged, at about 21-22 grams in size again. The right lobe is slightly enlarged. The left lobe is larger. The consistency of the thyroid gland is normal. The thyroid gland is again tender to palpation bilaterally, again more in the left lobe.     Lungs: The lungs are clear. Air movement is  good. Heart: The heart rhythm and rate appear normal. Heart sounds S1 and S2 are normal. I do not appreciate any pathologic heart murmurs. Abdomen: The abdomen is enlarged. Bowel sounds are normal. The abdomen is soft and a bit tender diffusely. There is no obviously palpable hepatomegaly, splenomegaly, or other masses.  Arms: Muscle mass appears appropriate for age.  Hands: There is no obvious tremor. Phalangeal and metacarpophalangeal joints appear normal. His rash has resolved.  Legs: Muscle mass appears appropriate for age. There is no edema.  Feet: There are no significant deformities. Dorsalis pedis pulses are 1-2+ on the right and 2+ on the left. His feet look good. He has no obvious tinea pedis of the feet today.  Neurologic: Muscle strength is normal for age and gender in both the upper and the lower extremities. Muscle tone appears normal. Sensation to touch is normal in the legs, but decreased slightly in the heels today.      LABS:   Labs 01/05/18: HbA1c 7.3%, CBG 148  Research labs 01/02/17: Amylase 181 (ref 21-101), lipase 15 (ref 7-60), phosphorus 3.3 (ref 2.5-4.5), CK 75 (ref 44-196), CMP normal, CBC normal, HIV antibody non-reactive  Labs 09/19/17: HbA1c 7.3%, CBG 198  Labs 09/05/17: phosphorus 3.3 (ref 2.5-4.5); HIV antibody non-reactive; lipase 18 (ref 7-60); amylase 148 (ref 21-101); CK 86 (ref 44-196); CMP normal, except glucose 237; CBC normal  Labs 06/15/17: HbA1c 7.1%, CBG 198  Labs 05/26/17: TSH 1.68, free T4 1.0, free T3 3.1; CMP normal; cholesterol 134, triglycerides 71, HDL 68, LDL 51; urinary microalbumin/creatinine ratio 3; serum amylase 133 (ref 21-101)  Labs 01/24/17: HbA1c 6.7%, CBG 99  Labs 10/27/16: HbA1c 8.6%,CBG 174  Labs 10/25/16: CMP normal, except for glucose 223; HIV antibody non-reactive  Labs 07/16/16: HbA1c 8.6%   Labs 06/25/16: Surveillance labs for hepatology: HIV negative; Amylase 155 (ref 0-105), lipase 34 (ref 7-60); CBC normal; CK 71 (ref  7-232); CMP normal except glucose 293; phosphorus 2.6 (ref 2.5-4.5)  Labs 03/31/16: HbA1c 8.4%; TSH 1.07, free T4 1.1, free T3 3.4; cholesterol 162, triglycerides 49, HDL 86, LDL 66; urinary microalbumin/creatinine ratio 1; CMP normal, except for glucose 173;   Labs 12/30/15: HbA1c 8.1%  Labs 10/01/15: HbA1c 7.8%  Labs 11/18/14; HbA1c 8.9%, compared with 7.7% at last visit and  with 7.0% at the visit prior. CMP normal except for glucose of 173, normal AST and ALT; cholesterol 161, triglycerides 8, HDL 67, LDL 78; urinary microalbumin/creatinine ratio 2.5; TSH 1.357, free T4 1.04, free T3 3.0  Labs 12/06/12: CMP with normal AST and ALT. Cholesterol 136, triglycerides 74, HDL 47 (decreased from 58 when he was exercising), LDL 74; microalbumin/creatinine ratio 2.3; TSH 1.782, free T4 0.98, free T3 3.1  Labs 02/03/12: CMP: Normal; TSH 1.565, free T4 0.98, free T3 3.0; cholesterol 156, triglycerides 125, HDL 58, LDL 73; Urinary microalbumin/creatinine ratio 4.2.  ASSESSMENT:  1. T1DM:   A. His BG control is still very good in terms of his HbA1c, average BG, average SG, and in terms of not having too much hypoglycemia. Unfortunately, during the past month he has been sick a lot and has not been able to exercise as much as he had been doing, so his average BG and average SG are higher.  B. Most of his higher BGs occurred on the fourth and fifth days of his sites. He is not having as many post-prandial glucose spikes. He needs to be more consistent with his diet, with his exercise regimen, and especially with changing sites every 3 days.. 2. Hypoglycemia:   A. He is not having many low SGs.   B. When he plans to exercise more than usual, he may need to set a lower TBR. 3. Thyroiditis: His thyroiditis is clinically active again today in both lobes. 4. Goiter: The thyroid gland has decreased in size a bit since his last visit. The previous waxing and waning of thyroid gland size is c/w evolving Hashimoto's  disease.  He was euthyroid in March 2014, February 2016, July 2017, and in September 2018.   5. Hypertension: His BP is good today. He needs to continue to exercise and to lose fat  weight.  6. Hyperlipidemia: Lipid panel results in March 2014, February 2016, July 2017, and September 2018 were good overall.  7. Autonomic neuropathy, tachycardia, and gastroparesis: With the worsening of BG control he had had in the past, his neuropathy, tachycardia, and gastroparesis worsened in parallel. He is doing progressively better now in all of these autonomic symptoms.   8. Peripheral neuropathy: His neuropathy is very mild today.   9. Adjustment reaction: He is doing very well today.  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 I had not seen these values until his last visit because they were part of a research study being conducted by [facility], but in reviewing his labs his amylase values have consistently been in the 150s-250s for many years. He thinks that his amylase levels were high even before he started Interferon therapy. His LFTs and total bilirubin are quite normal.   B. I had asked Trey Paula to talk with his hepatologist about this issue. The hepatologist thought that these values were related to his diabetes, not to the drugs he took for his hepatitis. That is not a complication of DM that I recognize. I still do not know why the amylase is elevated, but the amylase has been higher recently.   PLAN:  1. Diagnostic: HbA1c and CBG today. Call Dr. Fransico Javaun Dimperio in one month to discuss BGs.  2. Therapeutic:   A. Allow the pump-sensor system to correct BGs. Change pump sites every three days. Use Lamisil or other anti-fungal topical agent for tinea pedis. Continue enalapril and atorvastatin. Eat right. Exercise right. Use TBRs for unusually high exercise levels.  B. Continue current pump basal  rate settings.  MN: 0.925 3 AM: 1.45 4:30 AM: 1.50 8 AM: 1.25 12:00 noon: 0.90 5 PM: 0.825 7 PM: 1.20  C. ICRs; MN:  9 11 AM: 7 9 PM: 9 3. Patient education: We again discussed lifestyle issues and pump-sensor issues. I asked him again to change his sites every 3 days. He says that he will try to do so. 4. Follow up: FU appointment in 3 months.  Level of Service: This visit lasted in excess of 55 minutes. More than 50% of the visit was devoted to counseling.  Molli KnockMichael Malick Netz. MD, CDE Adult and Pediatric Endocrinology

## 2018-01-19 ENCOUNTER — Encounter (INDEPENDENT_AMBULATORY_CARE_PROVIDER_SITE_OTHER): Payer: BC Managed Care – PPO | Admitting: *Deleted

## 2018-01-19 VITALS — BP 108/76 | HR 71 | Temp 98.0°F | Wt 207.8 lb

## 2018-01-19 DIAGNOSIS — Z006 Encounter for examination for normal comparison and control in clinical research program: Secondary | ICD-10-CM

## 2018-01-19 LAB — COMPREHENSIVE METABOLIC PANEL
AG RATIO: 2 (calc) (ref 1.0–2.5)
ALBUMIN MSPROF: 4.4 g/dL (ref 3.6–5.1)
ALKALINE PHOSPHATASE (APISO): 73 U/L (ref 40–115)
ALT: 23 U/L (ref 9–46)
AST: 22 U/L (ref 10–40)
BUN: 12 mg/dL (ref 7–25)
CHLORIDE: 103 mmol/L (ref 98–110)
CO2: 30 mmol/L (ref 20–32)
CREATININE: 1.19 mg/dL (ref 0.60–1.35)
Calcium: 9.6 mg/dL (ref 8.6–10.3)
GLOBULIN: 2.2 g/dL (ref 1.9–3.7)
GLUCOSE: 111 mg/dL — AB (ref 65–99)
POTASSIUM: 5.1 mmol/L (ref 3.5–5.3)
Sodium: 140 mmol/L (ref 135–146)
Total Bilirubin: 0.5 mg/dL (ref 0.2–1.2)
Total Protein: 6.6 g/dL (ref 6.1–8.1)

## 2018-01-19 LAB — CK: Total CK: 107 U/L (ref 44–196)

## 2018-01-19 LAB — CBC WITH DIFFERENTIAL/PLATELET
BASOS PCT: 0.4 %
Basophils Absolute: 22 cells/uL (ref 0–200)
EOS ABS: 61 {cells}/uL (ref 15–500)
Eosinophils Relative: 1.1 %
HCT: 41.4 % (ref 38.5–50.0)
Hemoglobin: 13.9 g/dL (ref 13.2–17.1)
LYMPHS ABS: 3157 {cells}/uL (ref 850–3900)
MCH: 30.8 pg (ref 27.0–33.0)
MCHC: 33.6 g/dL (ref 32.0–36.0)
MCV: 91.8 fL (ref 80.0–100.0)
MPV: 10.5 fL (ref 7.5–12.5)
Monocytes Relative: 8.3 %
Neutro Abs: 1804 cells/uL (ref 1500–7800)
Neutrophils Relative %: 32.8 %
PLATELETS: 182 10*3/uL (ref 140–400)
RBC: 4.51 10*6/uL (ref 4.20–5.80)
RDW: 12.9 % (ref 11.0–15.0)
Total Lymphocyte: 57.4 %
WBC: 5.5 10*3/uL (ref 3.8–10.8)
WBCMIX: 457 {cells}/uL (ref 200–950)

## 2018-01-19 LAB — PHOSPHORUS: Phosphorus: 3.5 mg/dL (ref 2.5–4.5)

## 2018-01-19 LAB — LIPASE: LIPASE: 12 U/L (ref 7–60)

## 2018-01-19 LAB — AMYLASE: Amylase: 144 U/L — ABNORMAL HIGH (ref 21–101)

## 2018-01-19 NOTE — Progress Notes (Signed)
Samuel Valentine is here for his week 91 study visit for [facility] 083. He denies any problems from the last injection or any issues with adherence. He denies any new medications or other health problems. He is due to return in 6 weeks.

## 2018-01-20 LAB — HIV ANTIBODY (ROUTINE TESTING W REFLEX): HIV 1&2 Ab, 4th Generation: NONREACTIVE

## 2018-02-07 ENCOUNTER — Telehealth (INDEPENDENT_AMBULATORY_CARE_PROVIDER_SITE_OTHER): Payer: Self-pay | Admitting: "Endocrinology

## 2018-02-07 NOTE — Telephone Encounter (Signed)
°  Who's calling (name and relationship to patient) : Herbert Seta ([employer] Rep) Best contact number: [phone] Provider they see: Dr. Fransico Michael Reason for call: Heather lvm stating a PA is needed for [employer] test strips. The form has been faxed to the office and when completed can be faxed back to the number provided below.   (F) [phone]

## 2018-02-08 NOTE — Telephone Encounter (Signed)
Received confirmation of approval. Placed in chart.

## 2018-02-10 ENCOUNTER — Telehealth (INDEPENDENT_AMBULATORY_CARE_PROVIDER_SITE_OTHER): Payer: Self-pay | Admitting: "Endocrinology

## 2018-02-10 NOTE — Telephone Encounter (Signed)
°  Who's calling (name and relationship to patient) : Visz- ([employer] Rep)   Best contact number: [phone]  Provider they see: Fransico Michael    Reason for call: requesting copy of approval for test strips. pls fax to the number below  fax# [phone]

## 2018-02-10 NOTE — Telephone Encounter (Signed)
faxed

## 2018-02-27 ENCOUNTER — Encounter (INDEPENDENT_AMBULATORY_CARE_PROVIDER_SITE_OTHER): Payer: Self-pay

## 2018-02-27 VITALS — BP 101/64 | HR 73 | Temp 98.0°F | Wt 208.8 lb

## 2018-02-27 DIAGNOSIS — Z006 Encounter for examination for normal comparison and control in clinical research program: Secondary | ICD-10-CM

## 2018-02-27 NOTE — Progress Notes (Signed)
Patient presented for week 97 injection visit for study [id number]. Patient returned many bottles of study IP/placebo from different dispense dates. Reeducated regarding only using the newly dispensed IP so that we are able to accurately calculate compliance. Patient has pill planner prepped with past dispense from this week and did not return. Patient was instructed to remove those from pill planner and set aside in another container such as ziplock bag for later return and to use the pills from the bottle for this period. Patient voiced understanding. Vitals within normal range. Blood drawn without difficulty. Patient received injection of CAB LA or placebo at 0935 in right glute. Is scheduled for return visit in two weeks for safety follow-up.

## 2018-02-28 LAB — CBC WITH DIFFERENTIAL/PLATELET
Basophils Absolute: 29 cells/uL (ref 0–200)
Basophils Relative: 0.5 %
Eosinophils Absolute: 58 cells/uL (ref 15–500)
Eosinophils Relative: 1 %
HCT: 40.1 % (ref 38.5–50.0)
Hemoglobin: 13.5 g/dL (ref 13.2–17.1)
Lymphs Abs: 3550 cells/uL (ref 850–3900)
MCH: 30.8 pg (ref 27.0–33.0)
MCHC: 33.7 g/dL (ref 32.0–36.0)
MCV: 91.3 fL (ref 80.0–100.0)
MPV: 10.8 fL (ref 7.5–12.5)
Monocytes Relative: 8.5 %
Neutro Abs: 1670 cells/uL (ref 1500–7800)
Neutrophils Relative %: 28.8 %
Platelets: 199 10*3/uL (ref 140–400)
RBC: 4.39 10*6/uL (ref 4.20–5.80)
RDW: 12.9 % (ref 11.0–15.0)
Total Lymphocyte: 61.2 %
WBC mixed population: 493 cells/uL (ref 200–950)
WBC: 5.8 10*3/uL (ref 3.8–10.8)

## 2018-02-28 LAB — COMPREHENSIVE METABOLIC PANEL
AG Ratio: 1.9 (calc) (ref 1.0–2.5)
ALT: 29 U/L (ref 9–46)
AST: 28 U/L (ref 10–40)
Albumin: 4.3 g/dL (ref 3.6–5.1)
Alkaline phosphatase (APISO): 73 U/L (ref 40–115)
BUN: 12 mg/dL (ref 7–25)
CO2: 31 mmol/L (ref 20–32)
Calcium: 9.2 mg/dL (ref 8.6–10.3)
Chloride: 103 mmol/L (ref 98–110)
Creat: 1.1 mg/dL (ref 0.60–1.35)
Globulin: 2.3 g/dL (calc) (ref 1.9–3.7)
Glucose, Bld: 110 mg/dL — ABNORMAL HIGH (ref 65–99)
Potassium: 4.3 mmol/L (ref 3.5–5.3)
Sodium: 139 mmol/L (ref 135–146)
Total Bilirubin: 0.4 mg/dL (ref 0.2–1.2)
Total Protein: 6.6 g/dL (ref 6.1–8.1)

## 2018-02-28 LAB — HIV ANTIBODY (ROUTINE TESTING W REFLEX): HIV 1&2 Ab, 4th Generation: NONREACTIVE

## 2018-02-28 LAB — LIPASE: Lipase: 22 U/L (ref 7–60)

## 2018-02-28 LAB — AMYLASE: Amylase: 168 U/L — ABNORMAL HIGH (ref 21–101)

## 2018-02-28 LAB — PHOSPHORUS: PHOSPHORUS: 3.8 mg/dL (ref 2.5–4.5)

## 2018-02-28 LAB — CK: CK TOTAL: 123 U/L (ref 44–196)

## 2018-03-01 NOTE — Progress Notes (Signed)
Yes, voiced no complaints, all was well at his visit. Likely related to his DM type 1, spikes in amylase consistent throughout study participation.

## 2018-03-14 ENCOUNTER — Encounter: Payer: BC Managed Care – PPO | Admitting: *Deleted

## 2018-03-17 ENCOUNTER — Other Ambulatory Visit: Payer: Self-pay | Admitting: "Endocrinology

## 2018-03-21 ENCOUNTER — Encounter (INDEPENDENT_AMBULATORY_CARE_PROVIDER_SITE_OTHER): Payer: Self-pay

## 2018-03-21 VITALS — BP 98/68 | HR 71 | Temp 98.3°F | Wt 211.0 lb

## 2018-03-21 DIAGNOSIS — Z006 Encounter for examination for normal comparison and control in clinical research program: Secondary | ICD-10-CM

## 2018-03-21 NOTE — Progress Notes (Signed)
Participant here for safety follow-up for study [id number] week 99. No complaints voiced. Rapid HIV result negative . Participant is scheduled for next study visit on 04/20/18.

## 2018-03-22 LAB — CBC WITH DIFFERENTIAL/PLATELET
Basophils Absolute: 32 cells/uL (ref 0–200)
Basophils Relative: 0.5 %
Eosinophils Absolute: 58 cells/uL (ref 15–500)
Eosinophils Relative: 0.9 %
HCT: 41.9 % (ref 38.5–50.0)
HEMOGLOBIN: 14.2 g/dL (ref 13.2–17.1)
Lymphs Abs: 3520 cells/uL (ref 850–3900)
MCH: 31.2 pg (ref 27.0–33.0)
MCHC: 33.9 g/dL (ref 32.0–36.0)
MCV: 92.1 fL (ref 80.0–100.0)
MPV: 10.7 fL (ref 7.5–12.5)
Monocytes Relative: 6.2 %
NEUTROS PCT: 37.4 %
Neutro Abs: 2394 cells/uL (ref 1500–7800)
Platelets: 213 10*3/uL (ref 140–400)
RBC: 4.55 10*6/uL (ref 4.20–5.80)
RDW: 13 % (ref 11.0–15.0)
Total Lymphocyte: 55 %
WBC: 6.4 10*3/uL (ref 3.8–10.8)
WBCMIX: 397 {cells}/uL (ref 200–950)

## 2018-03-22 LAB — COMPREHENSIVE METABOLIC PANEL
AG Ratio: 1.7 (calc) (ref 1.0–2.5)
ALKALINE PHOSPHATASE (APISO): 83 U/L (ref 40–115)
ALT: 25 U/L (ref 9–46)
AST: 23 U/L (ref 10–40)
Albumin: 4.3 g/dL (ref 3.6–5.1)
BILIRUBIN TOTAL: 0.6 mg/dL (ref 0.2–1.2)
BUN: 13 mg/dL (ref 7–25)
CALCIUM: 9.2 mg/dL (ref 8.6–10.3)
CO2: 30 mmol/L (ref 20–32)
CREATININE: 1.18 mg/dL (ref 0.60–1.35)
Chloride: 100 mmol/L (ref 98–110)
GLUCOSE: 144 mg/dL — AB (ref 65–99)
Globulin: 2.6 g/dL (calc) (ref 1.9–3.7)
Potassium: 4.5 mmol/L (ref 3.5–5.3)
Sodium: 138 mmol/L (ref 135–146)
Total Protein: 6.9 g/dL (ref 6.1–8.1)

## 2018-03-22 LAB — HIV ANTIBODY (ROUTINE TESTING W REFLEX): HIV: NONREACTIVE

## 2018-03-22 LAB — LIPASE: LIPASE: 16 U/L (ref 7–60)

## 2018-03-22 LAB — CK: Total CK: 85 U/L (ref 44–196)

## 2018-03-22 LAB — PHOSPHORUS: PHOSPHORUS: 3.6 mg/dL (ref 2.5–4.5)

## 2018-03-22 LAB — AMYLASE: AMYLASE: 156 U/L — AB (ref 21–101)

## 2018-04-11 ENCOUNTER — Ambulatory Visit (INDEPENDENT_AMBULATORY_CARE_PROVIDER_SITE_OTHER): Payer: BC Managed Care – PPO | Admitting: "Endocrinology

## 2018-04-11 ENCOUNTER — Encounter (INDEPENDENT_AMBULATORY_CARE_PROVIDER_SITE_OTHER): Payer: Self-pay | Admitting: "Endocrinology

## 2018-04-11 VITALS — BP 118/70 | HR 80 | Ht 72.0 in | Wt 204.2 lb

## 2018-04-11 DIAGNOSIS — E1065 Type 1 diabetes mellitus with hyperglycemia: Secondary | ICD-10-CM

## 2018-04-11 DIAGNOSIS — F4320 Adjustment disorder, unspecified: Secondary | ICD-10-CM

## 2018-04-11 DIAGNOSIS — E063 Autoimmune thyroiditis: Secondary | ICD-10-CM

## 2018-04-11 DIAGNOSIS — R748 Abnormal levels of other serum enzymes: Secondary | ICD-10-CM

## 2018-04-11 DIAGNOSIS — E049 Nontoxic goiter, unspecified: Secondary | ICD-10-CM

## 2018-04-11 DIAGNOSIS — IMO0001 Reserved for inherently not codable concepts without codable children: Secondary | ICD-10-CM

## 2018-04-11 DIAGNOSIS — E10649 Type 1 diabetes mellitus with hypoglycemia without coma: Secondary | ICD-10-CM | POA: Diagnosis not present

## 2018-04-11 DIAGNOSIS — R000 Tachycardia, unspecified: Secondary | ICD-10-CM

## 2018-04-11 DIAGNOSIS — I4711 Inappropriate sinus tachycardia, so stated: Secondary | ICD-10-CM

## 2018-04-11 DIAGNOSIS — E1043 Type 1 diabetes mellitus with diabetic autonomic (poly)neuropathy: Secondary | ICD-10-CM

## 2018-04-11 DIAGNOSIS — G609 Hereditary and idiopathic neuropathy, unspecified: Secondary | ICD-10-CM

## 2018-04-11 LAB — POCT GLYCOSYLATED HEMOGLOBIN (HGB A1C): Hemoglobin A1C: 7.8 % — AB (ref 4.0–5.6)

## 2018-04-11 LAB — POCT GLUCOSE (DEVICE FOR HOME USE): POC GLUCOSE: 87 mg/dL (ref 70–99)

## 2018-04-11 NOTE — Progress Notes (Signed)
CC: FU T1DM, hypoglycemia, hypertension, hyperlipidemia, autonomic neuropathy, tachycardia, gastroparesis, peripheral neuropathy, thyroiditis, goiter, and fatigue.  HPI: Samuel Valentine is a 47 y.o. Caucasian gentleman. He was unaccompanied.   1. Samuel Valentine was diagnosed with T2DM about 2002-2003. He was treated with metformin, Avandia, and Amaryl, but BGs were not well controlled, so Lantus was begun in 2005. When it became apparent that he would need a multiple daily injection regimen with Lantus and Novolog, he was referred to me by [name], RN, CDE, of the [facility]. I saw him for the first time on 02/12/05. He had just been started on Novolog aspart insulin at meals (120/25/10 plan). His PMH included recurrent hypoglycemia, hypertension, dyslipidemia, and proteinuria. He was also allergic to penicillin. He had had oral surgery in the past, but no other surgery. His psychiatric diagnoses included depression, anxiety, and dissociative identity disorder. Social history included the fact that he was gay. All HIV tests had been negative through that time. He had recently been laid off and had no health insurance. In March 2006, his HbA1c had been 10.6%. On exam he had a goiter and mild peripheral neuropathy. 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 We accepted him as a charity patient and enrolled him in our Diabetes Survival Skills Program. He was converted to a Medtronic 508 insulin pump in August 2006.   2. During the past thirteen  years his DM self-care efforts and HbA1c values have waxed and waned, with A1c values ranging from 6.4%-9.2%. His worst BGs occurred when he was being treated with interferon for hepatitis B, from which he is now in remission. He has been gainfully employed at [employer], [employer], and again at [employer] for several years and has had health insurance. In 2011 we changed him to a Medtronic 723 (Revel) pump and  started him on the Medtronic CGM sensor. He later converted to a Medtronic 530G insulin pump. Since the introduction of CGM-pump therapy, his BGs have significantly improved when he has been consistent with his T1DM self-care. He started his new Medtronic 670G pump and Guardian 3 CGM on 12/07/16 and started in auto mode on 12/10/16. His BGs have been better since then.  3. His last [facility] visit was on 01/05/18. At that visit I continued his current pump settings.    A. In the interim he has been healthy. His allergies have not been bothering him as much.   B. He has been more physically active recently, so his BGs have tended to be lower. Sometimes he puts his pump into suspend mode. At other times he uses a higher temporary target, but in order to avoid having hypoglycemia during exercise, he must go into the temporary target at least 2 hours before beginning exercise.    C. His left hip is doing really good. He thinks that using his elliptical machine, and walking to work less, have been helpful.    D. He has had much more stable BGs when he is in auto mode. He really likes auto mode.    E. He has gained 2 more pounds since his last visit, probably both muscle and fat.     F. He has been taking enalapril, 10 mg once daily, 10 mg of atorvastatin once daily, Centrum Silver, and two 81 mg ASA per day.  G. His work situation and marriage are doing well.    H. At his last visit at [facility]  in December 2017 he was pronounced cured and released from further follow up care.  I. [facility].   4. Pertinent Review of Systems: Constitutional: The patient feels "really good".  Eyes: Vision is not as good. He has presbyopia, but no other problems. His last eye exam was in January 2019. There were no signs of diabetic eye disease.  Neck:  He has not had any discomfort in his anterior neck since his last visit.  Heart: His HR can increase with anxiety/stress.  Heart rate also increases with exercise or other physical activity. He has not had any chest wall pains. He has no other complaints of irregular heat beats, chest pain, or chest pressure. Gastrointestinal: His reflux is pretty well controlled now. He occasionally has constipation if he doesn't drink enough fluid and take enough fiber. He still has occasional abdominal bloating, usually after eating a lot of fresh vegetables. Hands: His hand rash has not recurred while using Lamisil or Lotrimin.  Legs: Muscle mass and strength seem normal. There are no complaints of numbness, tingling, burning, or pain. No edema is noted.  Feet:  He continues to apply Lamisil or Lotrimin cream as a preventive measure about twice a week. There are no complaints of numbness, tingling, burning, or pain. No edema is noted. Hypoglycemia: He is having more low BGs related to physical activity, but not otherwise. Psych: He has had some emotional stresses since his last visit, so is now seeing a therapist.   5 . BG printout: We have data since 03/29/17. He changes sites every 3- 5 days. He checks BGs 2-5 times per day, mostly 3-4 times. The longer his sites remain in place, the higher the BGs tend to be. His BGs have been less variable overall, but with some exceptions. His two BGs >300 occurred when he had suspended his pump for too long. He had one BG <80, a 42 at about 1 AM. He was flying in late from a trip and did not hear the pump alert him. Some sites work better than others. His average BG is 181, compared with 167 and his last visit and with 172 at the prior visit. His BG range is 42-310, compared with 101-382 at his last visit and with 75-324, at his prior visit.   6. CGM sensor readout: He has been in auto mode 95% of the time, compared with 97% at his last visit. He has been in SG range 73% of the time, compared with 75% of the time. SGs are highest after lunch, sometimes due to overcorrecting lower BGs and sometimes  due to forgetting to input his carb counts. Overall his sensor values and BG values correlate quite well. His average SG is 156, compared with 153 at his last visit. The sensor has identified 6 SGs <70, compared with 4 at his last visit.  His sensor values reflect his highly variable meal schedule, carb intakes, activity levels, bolusing, and stress levels. He still sometimes has problems counting carbs.   PAST MEDICAL, FAMILY, AND SOCIAL HISTORY:  1. 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 He and his husband live in [place]. They were married in the [address] of [place]. They are considering moving to [place] if the husband finds a job that he likes.  2. He has been using his elliptical machine more frequently and walking less frequently.  3. Primary care provider: Dr. Sharlot Gowda  REVIEW OF SYSTEMS:  Mr. Carrie MewCollis does not have any significant problems involving his other body systems.  PHYSICAL EXAM: BP 118/70   Pulse 80   Ht 6' (1.829 m)   Wt 204 lb 3.2 oz (92.6 kg)   BMI 27.69 kg/m     Constitutional: [name] looks healthy, but still overweight. He has gained 2 pounds since his last visit. He appears physically and emotionally well. His affect, engagement, and insight are normal.  Eyes: There is no arcus or proptosis.  Mouth: The oropharynx appears normal. The tongue appears normal. There is normal oral moisture. There is no obvious gingivitis. Neck: The thyroid gland appears slightly enlarged. There are no bruits present. The thyroid gland is enlarged, but smaller, at about 21 grams in size. The right lobe is now at the upper limit of normal for size. The left lobe is slightly enlarged. The consistency of the thyroid gland is normal. The thyroid gland is tender to palpation in the left lobe today, but not bilaterally as at his last visit.      Lungs: The lungs are clear. Air movement is good. Heart: The heart rhythm and rate appear normal. Heart sounds S1 and S2 are normal. I  do not appreciate any pathologic heart murmurs. Abdomen: The abdomen is enlarged. Bowel sounds are normal. The abdomen is soft and a bit tender diffusely. There is no obviously palpable hepatomegaly, splenomegaly, or other masses.  Arms: Muscle mass appears appropriate for age.  Hands: There is no obvious tremor. Phalangeal and metacarpophalangeal joints appear normal. His rash has resolved.  Legs: Muscle mass appears appropriate for age. There is no edema.  Feet: There are no significant deformities. Dorsalis pedis pulses are 1-2 on the right and 2+ on the left. His feet look good. He has no obvious tinea pedis of the feet today.  Neurologic: Muscle strength is normal for age and gender in both the upper and the lower extremities. Muscle tone appears normal. Sensation to touch is normal in the legs and in the feet today.     LABS:  Labs 04/11/17: HbA1c is 7.8%, CBG 87  Labs 09/19/17: HbA1c 7.3%, CBG 198  Labs 09/05/17: phosphorus 3.3 (ref 2.5-4.5); HIV antibody non-reactive; lipase 18 (ref 7-60); amylase 148 (ref 21-101); CK 86 (ref 44-196); CMP normal, except glucose 237; CBC normal  Labs 06/15/17: HbA1c 7.1%, CBG 198  Labs 05/26/17: TSH 1.68, free T4 1.0, free T3 3.1; CMP normal; cholesterol 134, triglycerides 71, HDL 68, LDL 51; urinary microalbumin/creatinine ratio 3; serum amylase 133 (ref 21-101)  Labs 01/24/17: HbA1c 6.7%, CBG 99  Labs 10/27/16: HbA1c 8.6%,CBG 174  Labs 10/25/16: CMP normal, except for glucose 223; HIV antibody non-reactive  Labs 07/16/16: HbA1c 8.6%   Labs 06/25/16: Surveillance labs for hepatology: HIV negative; Amylase 155 (ref 0-105), lipase 34 (ref 7-60); CBC normal; CK 71 (ref 7-232); CMP normal except glucose 293; phosphorus 2.6 (ref 2.5-4.5)  Labs 03/31/16: HbA1c 8.4%; TSH 1.07, free T4 1.1, free T3 3.4; cholesterol 162, triglycerides 49, HDL 86, LDL 66; urinary microalbumin/creatinine ratio 1; CMP normal, except for glucose 173;   Labs 12/30/15: HbA1c  8.1%  Labs 10/01/15: HbA1c 7.8%  Labs 11/18/14; HbA1c 8.9%, compared with 7.7% at last visit and with 7.0% at the visit prior. CMP normal except for glucose of 173, normal AST and ALT; cholesterol 161, triglycerides 8, HDL 67, LDL 78; urinary microalbumin/creatinine ratio 2.5; TSH 1.357, free T4 1.04, free T3 3.0  Labs 12/06/12: CMP with normal AST and ALT. Cholesterol 136, triglycerides  74, HDL 47 (decreased from 58 when he was exercising), LDL 74; microalbumin/creatinine ratio 2.3; TSH 1.782, free T4 0.98, free T3 3.1  Labs 02/03/12: CMP: Normal; TSH 1.565, free T4 0.98, free T3 3.0; cholesterol 156, triglycerides 125, HDL 58, LDL 73; Urinary microalbumin/creatinine ratio 4.2.  ASSESSMENT:  1. T1DM:   A. His BG control is not as good in terms of his average BG and HbA1c.   B. Some of his higher BGs occurred late on the fourth and fifth days of his sites. His highest BGs, however, occurred when he had suspended his pump. He is not having as many post-prandial glucose spikes. He needs to be more consistent with his diet, with his exercise regimen, and especially with changing sites every 3 days.. 2. Hypoglycemia:   A. He is not having many low SGs.   B. When he plans to exercise more than usual, he may need to set a lower TBR. 3. Thyroiditis: His thyroiditis is clinically active today, but only in the left lobe today.  4. Goiter: The thyroid gland has decreased in size a bit since his last visit. The previous waxing and waning of thyroid gland size is c/w evolving Hashimoto's disease.  He was euthyroid in March 2014, February 2016, July 2017, and in September 2018.   5. Hypertension: His BP is good today. He needs to continue to exercise and to lose fat  weight.  6. Hyperlipidemia: Lipid panel results in March 2014, February 2016, July 2017, and September 2018 were good overall.  7. Autonomic neuropathy, tachycardia, and gastroparesis: With the worsening of BG control he had had in the past, his  neuropathy, tachycardia, and gastroparesis worsened in parallel. He is doing progressively better now in all of his autonomic symptoms.   8. Peripheral neuropathy: His neuropathy is not evident today.   9. Adjustment reaction: He is doing very well today.  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 I had not seen these values until his last visit because they were part of a research study being conducted by the [facility], but in reviewing his labs his amylase values have consistently been in the 150s-250s for many years. He thinks that his amylase levels were high even before he started Interferon therapy. His LFTs and total bilirubin are quite normal.   B. I had asked Trey Paula to talk with his hepatologist about this issue. The hepatologist thought that these values were related to his diabetes. That is not a complication of DM that I recognize. I still do not know why the amylase is elevated, but the amylase has been higher recently.  PLAN:  1. Diagnostic: HbA1c and CBG today. Annual surveillance labs and amylase before his next visit.   2. Therapeutic:   A. Allow the pump-sensor system to correct BGs. Change pump sites every three days. Use Lamisil or other anti-fungal topical agent for tinea pedis. Continue enalapril and atorvastatin. Eat right. Exercise right. Use TBRs for unusually high exercise levels.  B. Continue current pump basal rate settings.  MN: 0.925 3 AM: 1.45 4:30 AM: 1.50 8 AM: 1.25 12:00 noon: 0.90 5 PM: 0.825 7 PM: 1.20  C. Continue ICRs; MN: 9 11 AM: 7 9 PM: 9 3. Patient education: We again discussed life style issues and pump-sensor issues. I asked him again to change his sites every 3 days. He says that he will try to do so. 4. Follow up: FU appointment in 3 months.  Level of Service: This visit lasted in excess of  60 minutes. More than 50% of the visit was devoted to counseling.  Molli Knock. MD, CDE Adult and Pediatric Endocrinology

## 2018-04-11 NOTE — Patient Instructions (Signed)
Follow up visit in 3 months. Please obtain lab tests about two wesks prior.

## 2018-04-12 ENCOUNTER — Telehealth (INDEPENDENT_AMBULATORY_CARE_PROVIDER_SITE_OTHER): Payer: Self-pay | Admitting: "Endocrinology

## 2018-04-12 NOTE — Telephone Encounter (Signed)
I called the patient to inform him that his HbA1c value from yesterday had increased from 7.3% to 7.8%. I told him that he needs to work harder at eating right, exercising, and changing his sites every 3 days. He understands.  Molli KnockMichael Abdurahman Rugg, MD, CDE

## 2018-04-20 ENCOUNTER — Encounter (INDEPENDENT_AMBULATORY_CARE_PROVIDER_SITE_OTHER): Payer: Self-pay

## 2018-04-20 VITALS — BP 115/81 | HR 71 | Temp 98.0°F | Wt 211.2 lb

## 2018-04-20 DIAGNOSIS — Z006 Encounter for examination for normal comparison and control in clinical research program: Secondary | ICD-10-CM

## 2018-04-20 NOTE — Progress Notes (Signed)
Participant here for [id number] study. Vitals within normal limits. Rapid HIV negative. Participant received new dispense of oral IP and IM injection of cabotegravir/placebo in right glute at 0858 with no issue. He is scheduled for next follow up on 8/15 at 0800.

## 2018-04-21 LAB — COMPREHENSIVE METABOLIC PANEL
AG Ratio: 1.9 (calc) (ref 1.0–2.5)
ALBUMIN MSPROF: 4.5 g/dL (ref 3.6–5.1)
ALKALINE PHOSPHATASE (APISO): 70 U/L (ref 40–115)
ALT: 27 U/L (ref 9–46)
AST: 25 U/L (ref 10–40)
BILIRUBIN TOTAL: 0.5 mg/dL (ref 0.2–1.2)
BUN: 12 mg/dL (ref 7–25)
CALCIUM: 9.5 mg/dL (ref 8.6–10.3)
CO2: 26 mmol/L (ref 20–32)
CREATININE: 1.13 mg/dL (ref 0.60–1.35)
Chloride: 101 mmol/L (ref 98–110)
GLOBULIN: 2.4 g/dL (ref 1.9–3.7)
Glucose, Bld: 119 mg/dL — ABNORMAL HIGH (ref 65–99)
POTASSIUM: 4.6 mmol/L (ref 3.5–5.3)
Sodium: 140 mmol/L (ref 135–146)
Total Protein: 6.9 g/dL (ref 6.1–8.1)

## 2018-04-21 LAB — URINALYSIS
BILIRUBIN URINE: NEGATIVE
GLUCOSE, UA: NEGATIVE
HGB URINE DIPSTICK: NEGATIVE
Ketones, ur: NEGATIVE
Leukocytes, UA: NEGATIVE
Nitrite: NEGATIVE
PROTEIN: NEGATIVE
Specific Gravity, Urine: 1.013 (ref 1.001–1.03)
pH: 6 (ref 5.0–8.0)

## 2018-04-21 LAB — LIPID PANEL
CHOLESTEROL: 136 mg/dL (ref <200)
HDL: 69 mg/dL (ref >40)
LDL Cholesterol (Calc): 54 mg/dL (calc)
Non-HDL Cholesterol (Calc): 67 mg/dL (calc) (ref <130)
Total CHOL/HDL Ratio: 2 (calc) (ref <5.0)
Triglycerides: 52 mg/dL (ref <150)

## 2018-04-21 LAB — CBC WITH DIFFERENTIAL/PLATELET
Basophils Absolute: 29 cells/uL (ref 0–200)
Basophils Relative: 0.5 %
EOS PCT: 0.9 %
Eosinophils Absolute: 52 cells/uL (ref 15–500)
HEMATOCRIT: 41.6 % (ref 38.5–50.0)
Hemoglobin: 14.2 g/dL (ref 13.2–17.1)
LYMPHS ABS: 3196 {cells}/uL (ref 850–3900)
MCH: 31.1 pg (ref 27.0–33.0)
MCHC: 34.1 g/dL (ref 32.0–36.0)
MCV: 91.2 fL (ref 80.0–100.0)
MPV: 10.6 fL (ref 7.5–12.5)
Monocytes Relative: 8 %
Neutro Abs: 2059 cells/uL (ref 1500–7800)
Neutrophils Relative %: 35.5 %
Platelets: 210 10*3/uL (ref 140–400)
RBC: 4.56 10*6/uL (ref 4.20–5.80)
RDW: 13.2 % (ref 11.0–15.0)
Total Lymphocyte: 55.1 %
WBC mixed population: 464 cells/uL (ref 200–950)
WBC: 5.8 10*3/uL (ref 3.8–10.8)

## 2018-04-21 LAB — AMYLASE: AMYLASE: 151 U/L — AB (ref 21–101)

## 2018-04-21 LAB — C. TRACHOMATIS/N. GONORRHOEAE RNA
C. TRACHOMATIS RNA, TMA: NOT DETECTED
N. gonorrhoeae RNA, TMA: NOT DETECTED

## 2018-04-21 LAB — RPR: RPR: NONREACTIVE

## 2018-04-21 LAB — CK: Total CK: 96 U/L (ref 44–196)

## 2018-04-21 LAB — LIPASE: LIPASE: 14 U/L (ref 7–60)

## 2018-04-21 LAB — HEPATITIS C ANTIBODY
Hepatitis C Ab: NONREACTIVE
SIGNAL TO CUT-OFF: 0.01 (ref <1.00)

## 2018-04-21 LAB — HIV ANTIBODY (ROUTINE TESTING W REFLEX): HIV 1&2 Ab, 4th Generation: NONREACTIVE

## 2018-04-21 LAB — PHOSPHORUS: PHOSPHORUS: 3.4 mg/dL (ref 2.5–4.5)

## 2018-04-22 LAB — CT/NG RNA, TMA RECTAL
Chlamydia Trachomatis RNA: NOT DETECTED
Neisseria Gonorrhoeae RNA: NOT DETECTED

## 2018-05-04 ENCOUNTER — Encounter (INDEPENDENT_AMBULATORY_CARE_PROVIDER_SITE_OTHER): Payer: Self-pay

## 2018-05-04 VITALS — BP 109/73 | HR 76 | Temp 98.0°F | Wt 208.0 lb

## 2018-05-04 DIAGNOSIS — Z006 Encounter for examination for normal comparison and control in clinical research program: Secondary | ICD-10-CM

## 2018-05-04 NOTE — Progress Notes (Signed)
Participant here for [id number] visit week 107. Voices no complaints. Vital signs within normal limits. Rapid HIV non-reactive. Participant is scheduled for next study visit on 06/22/18.

## 2018-05-05 LAB — CBC WITH DIFFERENTIAL/PLATELET
BASOS ABS: 18 {cells}/uL (ref 0–200)
Basophils Relative: 0.3 %
EOS ABS: 59 {cells}/uL (ref 15–500)
Eosinophils Relative: 1 %
HCT: 43.6 % (ref 38.5–50.0)
HEMOGLOBIN: 14.6 g/dL (ref 13.2–17.1)
Lymphs Abs: 3499 cells/uL (ref 850–3900)
MCH: 30.9 pg (ref 27.0–33.0)
MCHC: 33.5 g/dL (ref 32.0–36.0)
MCV: 92.4 fL (ref 80.0–100.0)
MPV: 10.8 fL (ref 7.5–12.5)
Monocytes Relative: 7.3 %
NEUTROS ABS: 1894 {cells}/uL (ref 1500–7800)
Neutrophils Relative %: 32.1 %
Platelets: 201 10*3/uL (ref 140–400)
RBC: 4.72 10*6/uL (ref 4.20–5.80)
RDW: 13.1 % (ref 11.0–15.0)
Total Lymphocyte: 59.3 %
WBC mixed population: 431 cells/uL (ref 200–950)
WBC: 5.9 10*3/uL (ref 3.8–10.8)

## 2018-05-05 LAB — COMPREHENSIVE METABOLIC PANEL
AG RATIO: 1.7 (calc) (ref 1.0–2.5)
ALBUMIN MSPROF: 4.3 g/dL (ref 3.6–5.1)
ALT: 30 U/L (ref 9–46)
AST: 24 U/L (ref 10–40)
Alkaline phosphatase (APISO): 71 U/L (ref 40–115)
BUN: 12 mg/dL (ref 7–25)
CO2: 30 mmol/L (ref 20–32)
CREATININE: 1.21 mg/dL (ref 0.60–1.35)
Calcium: 9.5 mg/dL (ref 8.6–10.3)
Chloride: 102 mmol/L (ref 98–110)
GLUCOSE: 150 mg/dL — AB (ref 65–99)
Globulin: 2.6 g/dL (calc) (ref 1.9–3.7)
Potassium: 4.7 mmol/L (ref 3.5–5.3)
SODIUM: 139 mmol/L (ref 135–146)
TOTAL PROTEIN: 6.9 g/dL (ref 6.1–8.1)
Total Bilirubin: 0.5 mg/dL (ref 0.2–1.2)

## 2018-05-05 LAB — AMYLASE: AMYLASE: 159 U/L — AB (ref 21–101)

## 2018-05-05 LAB — CK: Total CK: 95 U/L (ref 44–196)

## 2018-05-05 LAB — HIV ANTIBODY (ROUTINE TESTING W REFLEX): HIV 1&2 Ab, 4th Generation: NONREACTIVE

## 2018-05-05 LAB — PHOSPHORUS: PHOSPHORUS: 3.6 mg/dL (ref 2.5–4.5)

## 2018-05-05 LAB — LIPASE: Lipase: 15 U/L (ref 7–60)

## 2018-06-01 ENCOUNTER — Other Ambulatory Visit: Payer: Self-pay | Admitting: "Endocrinology

## 2018-07-12 ENCOUNTER — Other Ambulatory Visit: Payer: Self-pay | Admitting: "Endocrinology

## 2018-07-19 ENCOUNTER — Ambulatory Visit (INDEPENDENT_AMBULATORY_CARE_PROVIDER_SITE_OTHER): Payer: BC Managed Care – PPO | Admitting: "Endocrinology

## 2018-09-10 ENCOUNTER — Other Ambulatory Visit: Payer: Self-pay | Admitting: "Endocrinology

## 2018-09-10 DIAGNOSIS — I10 Essential (primary) hypertension: Secondary | ICD-10-CM

## 2018-09-11 ENCOUNTER — Other Ambulatory Visit: Payer: Self-pay | Admitting: "Endocrinology

## 2018-12-20 ENCOUNTER — Telehealth (INDEPENDENT_AMBULATORY_CARE_PROVIDER_SITE_OTHER): Payer: Self-pay | Admitting: "Endocrinology

## 2018-12-20 ENCOUNTER — Encounter (INDEPENDENT_AMBULATORY_CARE_PROVIDER_SITE_OTHER): Payer: Self-pay | Admitting: *Deleted

## 2018-12-20 NOTE — Telephone Encounter (Signed)
°  Who's calling (name and relationship to patient) : Samuel Valentine, Patient  Best contact number: [phone]  Provider they see: Dr. Fransico Michael  Reason for call: Patient states he is a previous patient of Dr. Fransico Michael, but moved to [place] 6 months ago, found an Endocrinologist and had the appointment coming up at the end of March, but that got cancelled due to COVID -19. Is wondering if he can get any supplies from [place] such the sensors, reservoir and infusion set for now until he can get in to see Endocrinologist in [place]. He will run out soon. Please call to discuss with patient.      PRESCRIPTION REFILL ONLY  Name of prescription:  Pharmacy:

## 2018-12-20 NOTE — Telephone Encounter (Signed)
Handled by Mychart. 

## 2019-09-18 ENCOUNTER — Other Ambulatory Visit: Payer: Self-pay | Admitting: "Endocrinology

## 2020-02-11 ENCOUNTER — Other Ambulatory Visit: Payer: Self-pay | Admitting: "Endocrinology

## 2020-02-11 DIAGNOSIS — I10 Essential (primary) hypertension: Secondary | ICD-10-CM

## 2021-03-24 DIAGNOSIS — F900 Attention-deficit hyperactivity disorder, predominantly inattentive type: Secondary | ICD-10-CM | POA: Insufficient documentation

## 2023-01-05 ENCOUNTER — Ambulatory Visit: Payer: BC Managed Care – PPO | Admitting: Family Medicine

## 2023-01-05 ENCOUNTER — Encounter: Payer: Self-pay | Admitting: Family Medicine

## 2023-01-05 VITALS — BP 110/58 | HR 79 | Temp 98.1°F | Resp 16 | Ht 72.75 in | Wt 224.6 lb

## 2023-01-05 DIAGNOSIS — M5412 Radiculopathy, cervical region: Secondary | ICD-10-CM | POA: Diagnosis not present

## 2023-01-05 DIAGNOSIS — A630 Anogenital (venereal) warts: Secondary | ICD-10-CM

## 2023-01-05 DIAGNOSIS — E109 Type 1 diabetes mellitus without complications: Secondary | ICD-10-CM | POA: Diagnosis not present

## 2023-01-05 DIAGNOSIS — Z8616 Personal history of COVID-19: Secondary | ICD-10-CM

## 2023-01-05 NOTE — Progress Notes (Signed)
   Subjective:    Patient ID: Samuel Valentine, male    DOB: 11/18/1970, 52 y.o.   MRN: [id number]  HPI He is here to reestablish care.  He has been living in [place] for the last several years and has recently moved back to this area.  He does have type 1 diabetes and is on Dexcom tandem.  His last hemoglobin A1c was 6.7.  He has had recent blood work within the last several months.  This was reviewed and is in the record from his previous physician.  He also has a small anal wart that he would like to have hyfrecated.  He also has a previous history of cervical radiculopathy and is now having difficulty with that again.  In the past he did have an epidural injection and would like to get set up for that again.  He also is on PREP but at this point does not need any medication. His previous physicians in [place] are in epic.  Recent lab data was evaluated.  He apparently had COVID and did have some x-ray/CT changes however repeat CT scan was negative.  Review of Systems     Objective:   Physical Exam Alert and in no distress. Exam of the anal area does show a very small wart      Assessment & Plan:  Type 1 diabetes mellitus without complication - Plan: Ambulatory referral to Endocrinology  Cervical radiculopathy - Plan: Ambulatory referral to Orthopedic Surgery  Anal wart  History of COVID-19 The wart was hyfrecated without difficulty. I will set him up to get involved with endocrine here in town.  Will also have him see Dr. Alvester Morin for follow-up on epidural injection. He is to set up for complete exam in 3 to 4 months since he had a recent evaluation in [place].

## 2023-01-07 ENCOUNTER — Telehealth: Payer: Self-pay | Admitting: Physical Medicine and Rehabilitation

## 2023-01-07 NOTE — Telephone Encounter (Signed)
Patient advising he have from Dr. Susann Givens. Please call for an appointment

## 2023-01-07 NOTE — Telephone Encounter (Signed)
Spoke with patient and scheduled OV for 01/11/23.

## 2023-01-11 ENCOUNTER — Encounter: Payer: Self-pay | Admitting: Physical Medicine and Rehabilitation

## 2023-01-11 ENCOUNTER — Ambulatory Visit: Payer: BC Managed Care – PPO | Admitting: Physical Medicine and Rehabilitation

## 2023-01-11 DIAGNOSIS — G8929 Other chronic pain: Secondary | ICD-10-CM

## 2023-01-11 DIAGNOSIS — M7918 Myalgia, other site: Secondary | ICD-10-CM

## 2023-01-11 DIAGNOSIS — M79602 Pain in left arm: Secondary | ICD-10-CM | POA: Diagnosis not present

## 2023-01-11 DIAGNOSIS — M25512 Pain in left shoulder: Secondary | ICD-10-CM | POA: Diagnosis not present

## 2023-01-11 DIAGNOSIS — M5412 Radiculopathy, cervical region: Secondary | ICD-10-CM

## 2023-01-11 NOTE — Progress Notes (Signed)
Functional Pain Scale - descriptive words and definitions  Unmanageable (7)  Pain interferes with normal ADL's/nothing seems to help/sleep is very difficult/active distractions are very difficult to concentrate on. Severe range order Neck pain with left arm radiculopathy. Numbness and tingling down to fingers.  Several issues. Average Pain 9

## 2023-01-11 NOTE — Progress Notes (Signed)
Samuel Valentine - 52 y.o. male MRN [id number]  Date of birth: 24-Jun-1971  Office Visit Note: Visit Date: 01/11/2023 PCP: Ronnald Nian, MD Referred by: Ronnald Nian, MD  Subjective: Chief Complaint  Patient presents with   Neck - Pain   HPI: Samuel Valentine is a 52 y.o. male who comes in today per the request of Dr. Sharlot Gowda for evaluation of chronic, worsening and severe bilateral neck pain radiating to left shoulder and down left arm. Also reports paraesthesias to 3rd, 4th and 5th digits of left hand. Pain worsens with movement and activity, describes pain as dull and stabbing sensation, currently rates as 7 out of 10. Some relief of pain with home exercise regimen, rest and use of medications. History of formal physical therapy in [place], some relief of pain with these treatments. Cervical MRI imaging in 2019 from [facility] exhibits uncovertebral changes at C4-C5 with moderate right and mild left foraminal stenosis. No high grade spinal canal stenosis noted. Patient underwent left C7-T1 interlaminar epidural steroid injection with Dr. Audelia Hives in [place]. He reports greater than 80% relief of pain for approximately 6-8 months, also reports increased functional ability post injection. Patient would like to discuss possible repeat injection. Patient denies focal weakness. No recent trauma or falls.      Review of Systems  Musculoskeletal:  Positive for neck pain.  Neurological:  Positive for tingling. Negative for sensory change, focal weakness and weakness.  All other systems reviewed and are negative.  Otherwise per HPI.  Assessment & Plan: Visit Diagnoses:    ICD-10-CM   1. Radiculopathy, cervical region  M54.12 Ambulatory referral to Physical Medicine Rehab    2. Chronic left shoulder pain  M25.512 Ambulatory referral to Physical Medicine Rehab   G89.29     3. Pain of left upper extremity  M79.602 Ambulatory referral to Physical Medicine Rehab    4. Myofascial  pain syndrome  M79.18        Plan: Findings:  Chronic, worsening and severe bilateral neck pain radiating to left shoulder and down left arm. Patient continues to have severe pain despite good conservative therapies such as formal physical therapy, home exercise regimen, rest and use of medications. Patients clinical presentation and exam are complex, his symptoms do not directly correlate with cervical MRI findings, however significant relief of pain with prior cervical epidural steroid injection. His pain is more of C7 nerve pattern.  Next step is to perform diagnostic and hopefully therapeutic left C7-T1 interlaminar epidural steroid injection under fluoroscopic guidance. He is not currently taking anticoagulant medications. I also feel there is a myofascial component contributing to his pain as there are multiple trigger points noted to left levator scapulae and trapezius regions.  Also feel patient would benefit from regrouping with our physical therapy team for possible dry needling treatments. Patient has no questions regarding injection procedure. He can continue with current medication regimen. No red flag symptoms noted upon exam today.     Meds & Orders: No orders of the defined types were placed in this encounter.   Orders Placed This Encounter  Procedures   Ambulatory referral to Physical Medicine Rehab    Follow-up: Return for left C7-T1 interlaminar epidural steroid injection.   Procedures: No procedures performed      Clinical History: EXAM: MRI CERVICAL SPINE WO CONTRAST   CLINICAL INDICATION: Neck pain, bilateral cervical radiculopathy.   COMPARISON: None.   TECHNIQUE: The study was acquired using the following pulse sequences:  Sagittal T1, sagittal T2, sagittal IR, axial T2.   FINDINGS: The curvature of the cervical spine is normal. Vertebral body  height, alignment and bone marrow signal intensity appear normal. The  central spinal canal is adequately patent  without evidence of extra-axial  fluid collection or mass. The cervical cord is normal in course, caliber  and signal intensity. The craniocervical junction is unremarkable. At C4-C5  there is mild loss of height and decreased T2 signal intensity of the  intervertebral disc, minimal posterior disc osteophyte complex, bilateral  uncovertebral joint osteophytes.  Moderate right and mild left-sided neural  foraminal narrowing.  No significant neural foraminal narrowing at other  cervical levels..   The paraspinal soft tissue appear unremarkable.   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  No evidence  of central canal stenosis or signal abnormality in the cervical cord.   Electronically Signed by: Shelbie Ammons, M.D.  Signed on: 12/14/2021 3:06 PM   He reports that he has never smoked. He has never used smokeless tobacco. No results for input(s): "HGBA1C", "LABURIC" in the last 8760 hours.  Objective:  VS:  HT:    WT:   BMI:     BP:   HR: bpm  TEMP: ( )  RESP:  Physical Exam Vitals and nursing note reviewed.  HENT:     Head: Normocephalic and atraumatic.     Right Ear: External ear normal.     Left Ear: External ear normal.     Nose: Nose normal.     Mouth/Throat:     Mouth: Mucous membranes are moist.  Eyes:     Extraocular Movements: Extraocular movements intact.  Cardiovascular:     Rate and Rhythm: Normal rate.     Pulses: Normal pulses.  Pulmonary:     Effort: Pulmonary effort is normal.  Abdominal:     General: Abdomen is flat. There is no distension.  Musculoskeletal:        General: Tenderness present.     Cervical back: Tenderness present.     Comments: Discomfort noted with flexion, extension and side-to-side rotation. Patient has good strength in the upper extremities including 5 out of 5 strength in wrist extension, long finger flexion and APB. Shoulder range of  motion is full bilaterally without any sign of impingement. There is no atrophy of the hands intrinsically. Sensation intact bilaterally. Dysesthesias noted to left C7 dermatome. Negative Hoffman's sign. Negative Spurling's sign.     Skin:    General: Skin is warm and dry.     Capillary Refill: Capillary refill takes less than 2 seconds.  Neurological:     General: No focal deficit present.     Mental Status: He is alert and oriented to person, place, and time.  Psychiatric:        Mood and Affect: Mood normal.        Behavior: Behavior normal.     Ortho Exam  Imaging: No results found.  Past Medical/Family/Surgical/Social History: Medications & Allergies reviewed per EMR, new medications updated. 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 Fransico Michael   No family history on file. No past surgical history on file. Social History   Occupational History   Occupation: Programmer, multimedia: [employer]  Tobacco Use   Smoking status: Never   Smokeless tobacco: Never  Substance and Sexual Activity   Alcohol use: Yes    Comment: maybe one drink per month   Drug use: No   Sexual activity: Yes    Partners: Male

## 2023-01-25 ENCOUNTER — Other Ambulatory Visit: Payer: Self-pay

## 2023-01-25 ENCOUNTER — Ambulatory Visit: Payer: BC Managed Care – PPO | Admitting: Physical Medicine and Rehabilitation

## 2023-01-25 VITALS — BP 158/95 | HR 88

## 2023-01-25 DIAGNOSIS — M5412 Radiculopathy, cervical region: Secondary | ICD-10-CM

## 2023-01-25 MED ORDER — METHYLPREDNISOLONE ACETATE 80 MG/ML IJ SUSP
80.0000 mg | Freq: Once | INTRAMUSCULAR | Status: AC
Start: 2023-01-25 — End: 2023-01-25
  Administered 2023-01-25: 80 mg

## 2023-01-25 NOTE — Patient Instructions (Signed)

## 2023-01-25 NOTE — Progress Notes (Signed)
Functional Pain Scale - descriptive words and definitions  Distracting (5)    Aware of pain/able to complete some ADL's but limited by pain/sleep is affected and active distractions are only slightly useful. Moderate range order  Average Pain 5-6   +Driver, -BT, -Dye Allergies.  Neck pain on the left side that radiates into the left arm

## 2023-02-02 NOTE — Progress Notes (Signed)
Samuel Valentine - 52 y.o. male MRN [id number]  Date of birth: 02/24/1971  Office Visit Note: Visit Date: 01/25/2023 PCP: Ronnald Nian, MD Referred by: Ronnald Nian, MD  Subjective: No chief complaint on file.  HPI:  Samuel Valentine is a 52 y.o. male who comes in today at the request of Ellin Goodie, FNP for planned Left C7-T1 Cervical Interlaminar epidural steroid injection with fluoroscopic guidance.  The patient has failed conservative care including home exercise, medications, time and activity modification.  This injection will be diagnostic and hopefully therapeutic.  Please see requesting physician notes for further details and justification.   ROS Otherwise per HPI.  Assessment & Plan: Visit Diagnoses:    ICD-10-CM   1. Cervical radiculopathy  M54.12 XR C-ARM NO REPORT    Epidural Steroid injection    methylPREDNISolone acetate (DEPO-MEDROL) injection 80 mg      Plan: No additional findings.   Meds & Orders:  Meds ordered this encounter  Medications   methylPREDNISolone acetate (DEPO-MEDROL) injection 80 mg    Orders Placed This Encounter  Procedures   XR C-ARM NO REPORT   Epidural Steroid injection    Follow-up: Return for visit to requesting provider as needed.   Procedures: No procedures performed  Cervical Epidural Steroid Injection - Interlaminar Approach with Fluoroscopic Guidance  Patient: Samuel Valentine      Date of Birth: Mar 07, 1971 MRN: [id number] PCP: Ronnald Nian, MD      Visit Date: 01/25/2023   Universal Protocol:    Date/Time: 05/15/248:11 AM  Consent Given By: the patient  Position: PRONE  Additional Comments: Vital signs were monitored before and after the procedure. Patient was prepped and draped in the usual sterile fashion. The correct patient, procedure, and site was verified.   Injection Procedure Details:   Procedure diagnoses: Cervical radiculopathy [M54.12]    Meds Administered:  Meds ordered this encounter   Medications   methylPREDNISolone acetate (DEPO-MEDROL) injection 80 mg     Laterality: Left  Location/Site: C7-T1  Needle: 3.5 in., 20 ga. Tuohy  Needle Placement: Paramedian epidural space  Findings:  -Comments: Excellent flow of contrast into the epidural space.  Procedure Details: Using a paramedian approach from the side mentioned above, the region overlying the inferior lamina was localized under fluoroscopic visualization and the soft tissues overlying this structure were infiltrated with 4 ml. of 1% Lidocaine without Epinephrine. A # 20 gauge, Tuohy needle was inserted into the epidural space using a paramedian approach.  The epidural space was localized using loss of resistance along with contralateral oblique bi-planar fluoroscopic views.  After negative aspirate for air, blood, and CSF, a 2 ml. volume of Isovue-250 was injected into the epidural space and the flow of contrast was observed. Radiographs were obtained for documentation purposes.   The injectate was administered into the level noted above.  Additional Comments:  No complications occurred Dressing: 2 x 2 sterile gauze and Band-Aid    Post-procedure details: Patient was observed during the procedure. Post-procedure instructions were reviewed.  Patient left the clinic in stable condition.   Clinical History: EXAM: MRI CERVICAL SPINE WO CONTRAST   CLINICAL INDICATION: Neck pain, bilateral cervical radiculopathy.   COMPARISON: None.   TECHNIQUE: The study was acquired using the following pulse sequences:  Sagittal T1, sagittal T2, sagittal IR, axial T2.   FINDINGS: The curvature of the cervical spine is normal. Vertebral body  height, alignment and bone marrow signal intensity appear normal. The  central spinal canal is  adequately patent without evidence of extra-axial  fluid collection or mass. The cervical cord is normal in course, caliber  and signal intensity. The craniocervical junction is  unremarkable. At C4-C5  there is mild loss of height and decreased T2 signal intensity of the  intervertebral disc, minimal posterior disc osteophyte complex, bilateral  uncovertebral joint osteophytes.  Moderate right and mild left-sided neural  foraminal narrowing.  No significant neural foraminal narrowing at other  cervical levels..   The paraspinal soft tissue appear unremarkable.   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  No evidence  of central canal stenosis or signal abnormality in the cervical cord.   Electronically Signed by: Shelbie Ammons, M.D.  Signed on: 12/14/2021 3:06 PM     Objective:  VS:  HT:    WT:   BMI:     BP:(!) 158/95  HR:88bpm  TEMP: ( )  RESP:  Physical Exam Vitals and nursing note reviewed.  Constitutional:      General: He is not in acute distress.    Appearance: Normal appearance. He is not ill-appearing.  HENT:     Head: Normocephalic and atraumatic.     Right Ear: External ear normal.     Left Ear: External ear normal.  Eyes:     Extraocular Movements: Extraocular movements intact.  Cardiovascular:     Rate and Rhythm: Normal rate.     Pulses: Normal pulses.  Abdominal:     General: There is no distension.     Palpations: Abdomen is soft.  Musculoskeletal:        General: No signs of injury.     Cervical back: Neck supple. Tenderness present. No rigidity.     Right lower leg: No edema.     Left lower leg: No edema.     Comments: Patient has good strength in the upper extremities with 5 out of 5 strength in wrist extension long finger flexion APB.  No intrinsic hand muscle atrophy.  Negative Hoffmann's test.  Lymphadenopathy:     Cervical: No cervical adenopathy.  Skin:    Findings: No erythema or rash.  Neurological:     General: No focal deficit present.     Mental Status: He is alert and oriented to person, place, and time.      Sensory: No sensory deficit.     Motor: No weakness or abnormal muscle tone.     Coordination: Coordination normal.  Psychiatric:        Mood and Affect: Mood normal.        Behavior: Behavior normal.      Imaging: No results found.

## 2023-02-02 NOTE — Procedures (Signed)
Cervical Epidural Steroid Injection - Interlaminar Approach with Fluoroscopic Guidance  Patient: Samuel Valentine      Date of Birth: 08/15/1971 MRN: [id number] PCP: Ronnald Nian, MD      Visit Date: 01/25/2023   Universal Protocol:    Date/Time: 05/15/248:11 AM  Consent Given By: the patient  Position: PRONE  Additional Comments: Vital signs were monitored before and after the procedure. Patient was prepped and draped in the usual sterile fashion. The correct patient, procedure, and site was verified.   Injection Procedure Details:   Procedure diagnoses: Cervical radiculopathy [M54.12]    Meds Administered:  Meds ordered this encounter  Medications   methylPREDNISolone acetate (DEPO-MEDROL) injection 80 mg     Laterality: Left  Location/Site: C7-T1  Needle: 3.5 in., 20 ga. Tuohy  Needle Placement: Paramedian epidural space  Findings:  -Comments: Excellent flow of contrast into the epidural space.  Procedure Details: Using a paramedian approach from the side mentioned above, the region overlying the inferior lamina was localized under fluoroscopic visualization and the soft tissues overlying this structure were infiltrated with 4 ml. of 1% Lidocaine without Epinephrine. A # 20 gauge, Tuohy needle was inserted into the epidural space using a paramedian approach.  The epidural space was localized using loss of resistance along with contralateral oblique bi-planar fluoroscopic views.  After negative aspirate for air, blood, and CSF, a 2 ml. volume of Isovue-250 was injected into the epidural space and the flow of contrast was observed. Radiographs were obtained for documentation purposes.   The injectate was administered into the level noted above.  Additional Comments:  No complications occurred Dressing: 2 x 2 sterile gauze and Band-Aid    Post-procedure details: Patient was observed during the procedure. Post-procedure instructions were reviewed.  Patient left  the clinic in stable condition.

## 2023-02-24 ENCOUNTER — Encounter: Payer: Self-pay | Admitting: "Endocrinology

## 2023-02-24 ENCOUNTER — Ambulatory Visit: Payer: BC Managed Care – PPO | Admitting: "Endocrinology

## 2023-02-24 VITALS — BP 118/70 | HR 77 | Ht 72.75 in | Wt 228.2 lb

## 2023-02-24 DIAGNOSIS — E1065 Type 1 diabetes mellitus with hyperglycemia: Secondary | ICD-10-CM | POA: Diagnosis not present

## 2023-02-24 DIAGNOSIS — E109 Type 1 diabetes mellitus without complications: Secondary | ICD-10-CM

## 2023-02-24 LAB — POCT GLYCOSYLATED HEMOGLOBIN (HGB A1C): Hemoglobin A1C: 6.6 % — AB (ref 4.0–5.6)

## 2023-02-24 LAB — LIPID PANEL
Cholesterol: 175 mg/dL (ref 0–200)
HDL: 71.4 mg/dL (ref >39.00)
LDL Cholesterol: 92 mg/dL (ref 0–99)
NonHDL: 103.19
Total CHOL/HDL Ratio: 2
Triglycerides: 55 mg/dL (ref 0.0–149.0)
VLDL: 11 mg/dL (ref 0.0–40.0)

## 2023-02-24 LAB — MICROALBUMIN / CREATININE URINE RATIO
Creatinine,U: 147 mg/dL
Microalb Creat Ratio: 0.5 mg/g (ref 0.0–30.0)
Microalb, Ur: <0.7 mg/dL (ref 0.0–1.9)

## 2023-02-24 MED ORDER — INSULIN GLARGINE 100 UNIT/ML SOLOSTAR PEN: 60.0000 [IU] | PEN_INJECTOR | Freq: Every day | SUBCUTANEOUS | 2 refills | Status: AC

## 2023-02-24 NOTE — Progress Notes (Signed)
[facility] Note  Samuel Valentine 22-Aug-1971 [id number]  Referring Provider: @REFPROVFULL @ Primary Care Provider: Ronnald Nian, MD Reason for consultation: No chief complaint on file.   Assessment & Plan Diagnoses and all orders for this visit:  Uncontrolled type 1 diabetes mellitus with hyperglycemia (HCC) -     Microalbumin / creatinine urine ratio; Future -     Lipid panel; Future -     Lipid panel -     Microalbumin / creatinine urine ratio  Type 1 diabetes mellitus without complication (HCC) -     POCT glycosylated hemoglobin (Hb A1C)    1.Type 1 diabetes mellitus Hba1c goal less than 7.0, current Hba1c is 6.6 Continue current settings, has different profiles for regular/steroid/walk Gets steroid shots in neck Q6 mo with BG on steroid profile for 2 months Ordered backup lantus in case of pump failure   -check blood sugars before each meals and at bedtime -pre bolus carbs when eating. Pre bolus needs to be 5 minutes before the meal -do not enter false or fake carbohydrates -do not enter carbohydratess after you eat and try to catch up with a high blood sugar -change sets on time to have consistent insulin absorption and prevent scar formation -call the office /answering service for blood sugar questions regarding hyper or hypoglycemia or sensor and transmitter issues -insulin settings updated and scanned in chart  No known contraindications to any of above medications Glucagon -pt has it  Hyperlipidemia -Last LDL off goal: 125 in 2022 -on atorvastatin 10 mg QD -Follow low fat diet and exercise  -Ordered lab now  -Blood pressure goal <140/90 - Microalbumin/creatinine goal < 30 -On enalapril 5 mg bid -diet changes including salt restriction -limit eating outside -counseled BP targets per standards of diabetes care -Uncontrolled blood pressure can lead to retinopathy, nephropathy and cardiovascular and atherosclerotic heart disease  Reviewed  and counseled on: -A1C target -Blood sugar targets -Complications of uncontrolled diabetes  -Checking blood sugar before meals and bedtime and bring log next visit -All medications with mechanism of action and side effects -Hypoglycemia management: rule of 15's, Glucagon Emergency Kit and medical alert ID -low-carb low-fat plate-method diet -At least 20 minutes of physical activity per day -Annual dilated retinal eye exam and foot exam -compliance and follow up needs -follow up as scheduled or earlier if problem gets worse  Call if blood sugar is less than 70 or consistently above 250    Take a 15 gm snack of carbohydrate at bedtime before you go to sleep if your blood sugar is less than 100.    If you are going to fast after midnight for a test or procedure, ask your physician for instructions on how to reduce/decrease your insulin dose.    Call if blood sugar is less than 70 or consistently above 250  -Treating a low sugar by rule of 15  (15 gms of sugar every 15 min until sugar is more than 70) If you feel your sugar is low, test your sugar to be sure If your sugar is low (less than 70), then take 15 grams of a fast acting Carbohydrate (3-4 glucose tablets or glucose gel or 4 ounces of juice or regular soda) Recheck your sugar 15 min after treating low to make sure it is more than 70 If sugar is still less than 70, treat again with 15 grams of carbohydrate          Don't drive the hour of hypoglycemia  If unconscious/unable to eat or drink by mouth, use glucagon injection or nasal spray baqsimi and call 911. Can repeat again in 15 min if still unconscious.    I have reviewed current medications, nurse's notes, allergies, vital signs, past medical and surgical history, family medical history, and social history for this encounter. Counseled patient on symptoms, examination findings, lab findings, imaging results, treatment decisions and monitoring and prognosis. The patient  understood the recommendations and agrees with the treatment plan. All questions regarding treatment plan were fully answered.  Return in about 3 months (around 05/27/2023) for visit, labs now.   Altamese Inwood, MD 02/24/23    History of Present Illness Ezekial Skahan is a 52 y.o. year old male who presents to our clinic for a new referral for Type 1 diabetes mellitus. Iban Sobel was first diagnosed at age 52..  Current diabetic regimen: T-slim X2 and dexcom G6 Insulin Pump Settings       Ambulatory BG Monitoring  CGM interpretation: At today's visit, we reviewed her CGM downloads. The full report is scanned in the media. Reviewing the CGM trends, BG mildly elevated in daytime, between 11pm-3 am  Insulin pump  Pump downloaded and reviewed. Blood sugars reviewed Basal rates reviewed Control IQ mode % reviewed Manual mode % reviewed Sensor wear % reviewed Readings above target % reviewed Readings at target % reviewed Readings below target % reviewed TDD units of insulin /day reviewed Changing sets on time reviewed Calibration reviewed Carb counting reviewed Basal bolus ratio reviewed Basal % reviewed Bolus % reviewed Duration of pump suspends reviewed No reaction at insulin pump sites Set change=every 3 days Reservoir change = every 3 days   Physical Exam  BP 118/70   Pulse 77   Ht 6' 0.75" (1.848 m)   Wt 228 lb 3.2 oz (103.5 kg)   SpO2 98%   BMI 30.31 kg/m    Constitutional: well developed, well nourished Head: normocephalic, atraumatic Eyes: sclera anicteric, no redness Neck: supple Lungs: normal respiratory effort Neurology: alert and oriented Skin: dry, no appreciable rashes Musculoskeletal: no appreciable defects Psychiatric: normal mood and affect Diabetic Foot Exam - Simple   Simple Foot Form Diabetic Foot exam was performed with the following findings: Yes 02/24/2023  8:46 AM  Visual Inspection No deformities, no ulcerations, no other skin  breakdown bilaterally: Yes Sensation Testing Intact to touch and monofilament testing bilaterally: Yes Pulse Check Posterior Tibialis and Dorsalis pulse intact bilaterally: Yes Comments     Current Medications Patient's Medications  New Prescriptions   No medications on file  Previous Medications   ALBUTEROL (VENTOLIN HFA) 108 (90 BASE) MCG/ACT INHALER    Inhale into the lungs.  Inhale 2 puffs into the lungs every 4 hours as needed for Shortness of Breath, Wheezing or Other (Cough).   ASPIRIN 81 MG CHEWABLE TABLET    Chew 81 mg by mouth 2 (two) times daily.    ATORVASTATIN (LIPITOR) 10 MG TABLET    TAKE 1 TABLET BY MOUTH EVERY DAY   CONTINUOUS GLUCOSE RECEIVER (DEXCOM G6 RECEIVER) DEVI    by Does not apply route.   CONTINUOUS GLUCOSE SENSOR (DEXCOM G6 SENSOR) MISC    USE 1 SENSOR EVERY 10 DAYS AS DIRECTED   CONTOUR NEXT TEST TEST STRIP    USE TO TEST BLOOD GLUCOSE 6 TO 8 TIMES AS DIRECTED   EMTRICITABINE-TENOFOVIR (TRUVADA) 200-300 MG TABLET    Take 1 tablet by mouth daily. This may be placebo and is study provided. He is  also taking cabotegravir/placebo 30mg  daily.   ENALAPRIL (VASOTEC) 5 MG TABLET    TAKE 1 TABLET(5 MG) BY MOUTH TWICE DAILY   GLUCAGON (GLUCAGON EMERGENCY) 1 MG INJECTION    Inject 1 mg into the vein once as needed.   GLUCAGON (GVOKE HYPOPEN 2-PACK) 1 MG/0.2ML SOAJ    Inject into the skin.  Inject 1 mg into the skin as needed (urgent hypoglycemia).   KETOCONAZOLE (NIZORAL) 2 % CREAM    Apply 1 Application topically 2 (two) times daily.   LANTUS SOLOSTAR 100 UNIT/ML SOLOSTAR PEN    Use up to 50 units per day during pump failure.   LORAZEPAM (ATIVAN) 0.5 MG TABLET    Take 1 tablet by mouth daily as needed.   MULTIPLE VITAMIN (MULTIVITAMIN) TABLET    Take 1 tablet by mouth daily.     NAPROXEN (NAPROSYN) 500 MG TABLET    Take 1 pill po bid for 1-2 wks, then prn pain. Take with food.   NOVOLOG 100 UNIT/ML INJECTION    ADMINISTER 300 UNITS VIA INSULIN PUMP EVERY 48 HOURS    NOVOLOG PENFILL CARTRIDGE    USE IN INSULIN PUMP AS DIRECTED(300 UNITS EVERY 2-3 DAYS)   VALACYCLOVIR (VALTREX) 1000 MG TABLET    2 pills twice a day at the first sign of cold sore  Modified Medications   No medications on file  Discontinued Medications   No medications on file    Allergies Allergies  Allergen Reactions   Nirmatrelvir-Ritonavir Diarrhea, Nausea And Vomiting, Nausea Only and Other (See Comments)    Nausea, bradycardia, abdominal pain,    Penicillins     Past Medical History Past Medical History:  Diagnosis Date   Chronic hepatitis B (HCC)    followed at [facility]   Hypercholesterolemia    Type 1 diabetes (HCC)    Dr. Fransico Michael    Past Surgical History History reviewed. No pertinent surgical history.  Family History family history is not on file.  Social History Social History   Socioeconomic History   Marital status: Married    Spouse name: Not on file   Number of children: Not on file   Years of education: Not on file   Highest education level: Not on file  Occupational History   Occupation: data analyst    Employer: [employer]  Tobacco Use   Smoking status: Never   Smokeless tobacco: Never  Substance and Sexual Activity   Alcohol use: Yes    Comment: maybe one drink per month   Drug use: No   Sexual activity: Yes    Partners: Male  Other Topics Concern   Not on file  Social History Narrative   Same sex marriage, 2 cats   Social Determinants of Health   Financial Resource Strain: Not on file  Food Insecurity: Not on file  Transportation Needs: Not on file  Physical Activity: Not on file  Stress: Not on file  Social Connections: Not on file  Intimate Partner Violence: Not on file    Lab Results  Component Value Date   HGBA1C 6.6 (A) 02/24/2023   Lab Results  Component Value Date   CHOL 136 04/20/2018   Lab Results  Component Value Date   HDL 69 04/20/2018   Lab Results  Component Value Date   LDLCALC 54 04/20/2018   Lab  Results  Component Value Date   TRIG 52 04/20/2018   Lab Results  Component Value Date   CHOLHDL 2.0 04/20/2018   Lab Results  Component Value Date   CREATININE 1.21 05/04/2018   Lab Results  Component Value Date   MICROALBUR 0.3 05/26/2017    Parts of this note may have been dictated using voice recognition software. There may be variances in spelling and vocabulary which are unintentional. Not all errors are proofread. Please notify the [facility] Parkin if any discrepancies are noted or if the meaning of any statement is not clear.

## 2023-03-10 ENCOUNTER — Encounter: Payer: Self-pay | Admitting: "Endocrinology

## 2023-03-10 MED ORDER — DEXCOM G6 TRANSMITTER MISC: 1.0000 | 3 refills | Status: DC

## 2023-04-07 ENCOUNTER — Encounter: Payer: Self-pay | Admitting: "Endocrinology

## 2023-04-08 ENCOUNTER — Other Ambulatory Visit: Payer: Self-pay

## 2023-04-08 MED ORDER — INSULIN ASPART 100 UNIT/ML IJ SOLN: INTRAMUSCULAR | 1 refills | Status: DC

## 2023-04-12 ENCOUNTER — Ambulatory Visit: Payer: BC Managed Care – PPO | Admitting: Family Medicine

## 2023-04-12 ENCOUNTER — Encounter: Payer: Self-pay | Admitting: Family Medicine

## 2023-04-12 VITALS — BP 124/62 | HR 72 | Temp 97.9°F | Resp 16 | Wt 229.4 lb

## 2023-04-12 DIAGNOSIS — Z1211 Encounter for screening for malignant neoplasm of colon: Secondary | ICD-10-CM | POA: Diagnosis not present

## 2023-04-12 DIAGNOSIS — E109 Type 1 diabetes mellitus without complications: Secondary | ICD-10-CM

## 2023-04-12 DIAGNOSIS — Z209 Contact with and (suspected) exposure to unspecified communicable disease: Secondary | ICD-10-CM

## 2023-04-12 MED ORDER — EMTRICITABINE-TENOFOVIR DF 200-300 MG PO TABS
1.0000 | ORAL_TABLET | Freq: Every day | ORAL | 0 refills | Status: DC
Start: 2023-04-12 — End: 2023-06-27

## 2023-04-12 NOTE — Progress Notes (Signed)
   Subjective:    Patient ID: Samuel Valentine, male    DOB: 1971/03/30, 52 y.o.   MRN: [id number]  HPI He is here for follow-up visit.  He is now being seen by endocrinology for his underlying type 1 diabetes.  He has had some difficulties with understanding the thick accent of his endocrinologist.  He is using Dexcom which does directly connect with his insulin pump and he is very happy with this.  He does have glucagon available.  He would like Truvada renewed although he has not had any recent risky sexual behavior.  He does occasionally get counseling just for general purposes.  He plans to continue to do that.  The medical record was reviewed.  In general things are going quite well.   Review of Systems     Objective:    Physical Exam Alert and in no distress otherwise not examined       Assessment & Plan:  Screening for colon cancer - Plan: Cologuard  Contact with or exposure to communicable disease - Plan: emtricitabine-tenofovir (TRUVADA) 200-300 MG tablet  Type 1 diabetes mellitus without complication (HCC)

## 2023-04-13 ENCOUNTER — Other Ambulatory Visit: Payer: Self-pay

## 2023-04-13 DIAGNOSIS — Z794 Long term (current) use of insulin: Secondary | ICD-10-CM

## 2023-04-13 MED ORDER — DEXCOM G6 SENSOR MISC
1.0000 [IU] | 3 refills | Status: DC
Start: 2023-04-13 — End: 2023-06-15

## 2023-06-15 ENCOUNTER — Encounter: Payer: Self-pay | Admitting: "Endocrinology

## 2023-06-15 ENCOUNTER — Ambulatory Visit: Payer: BC Managed Care – PPO | Admitting: "Endocrinology

## 2023-06-15 VITALS — BP 150/83 | HR 89 | Ht 72.0 in | Wt 228.0 lb

## 2023-06-15 DIAGNOSIS — Z9641 Presence of insulin pump (external) (internal): Secondary | ICD-10-CM | POA: Diagnosis not present

## 2023-06-15 DIAGNOSIS — E7800 Pure hypercholesterolemia, unspecified: Secondary | ICD-10-CM | POA: Diagnosis not present

## 2023-06-15 DIAGNOSIS — E1065 Type 1 diabetes mellitus with hyperglycemia: Secondary | ICD-10-CM

## 2023-06-15 LAB — POCT GLYCOSYLATED HEMOGLOBIN (HGB A1C): Hemoglobin A1C: 6.7 % — AB (ref 4.0–5.6)

## 2023-06-15 MED ORDER — ATORVASTATIN CALCIUM 10 MG PO TABS: 40.0000 mg | ORAL_TABLET | Freq: Every day | ORAL | 1 refills | Status: DC

## 2023-06-15 MED ORDER — INSULIN ASPART 100 UNIT/ML IJ SOLN
INTRAMUSCULAR | 1 refills | Status: DC
Start: 2023-06-15 — End: 2023-08-10

## 2023-06-15 MED ORDER — DEXCOM G6 SENSOR MISC: 1.0000 [IU] | 3 refills | Status: DC

## 2023-06-15 MED ORDER — ATORVASTATIN CALCIUM 40 MG PO TABS
40.0000 mg | ORAL_TABLET | Freq: Every day | ORAL | 1 refills | Status: DC
Start: 2023-06-15 — End: 2023-08-10

## 2023-06-15 NOTE — Progress Notes (Signed)
[facility] Note  Samuel Valentine Jun 05, 1971 [id number]  Referring Provider: @REFPROVFULL @ Primary Care Provider: Ronnald Nian, Valentine Reason for consultation: No chief complaint on file.   Assessment & Plan Diagnoses and all orders for this visit:  Uncontrolled type 1 diabetes mellitus with hyperglycemia (HCC) -     POCT glycosylated hemoglobin (Hb A1C) -     atorvastatin (LIPITOR) 40 MG tablet; Take 1 tablet (40 mg total) by mouth daily. -     insulin aspart (NOVOLOG) 100 UNIT/ML injection; ADMINISTER 300 UNITS VIA INSULIN PUMP EVERY 48 HOURS -     Continuous Glucose Sensor (DEXCOM G6 SENSOR) MISC; Apply 1 Units topically See admin instructions. Change sensor every 10 days  Insulin pump in place  Pure hypercholesterolemia  Other orders -     Discontinue: atorvastatin (LIPITOR) 10 MG tablet; Take 4 tablets (40 mg total) by mouth daily.    Type 1 diabetes mellitus  Hba1c goal less than 7.0, current Hba1c is 6.7 Changed I:C to 1:8 (to get more insulin with meals) and ISF 1:35 (to avoid hypoglycemia post correction ), has different profiles for regular/steroid/walk Gets steroid shots in neck Q6 mo with BG on steroid profile for 2 months Ordered backup lantus in case of pump failure previously    -check blood sugars before each meals and at bedtime -pre bolus carbs when eating. Pre bolus needs to be 5 minutes before the meal -do not enter false or fake carbohydrates -do not enter carbohydratess after you eat and try to catch up with a high blood sugar -change sets on time to have consistent insulin absorption and prevent scar formation -call the office /answering service for blood sugar questions regarding hyper or hypoglycemia or sensor and transmitter issues -insulin settings updated and scanned in chart  No known contraindications to any of above medications Glucagon -pt has it  Hyperlipidemia -Last LDL off goal: 92 in 02/2023 -on atorvastatin 10 mg  QD -Follow low fat diet and exercise  -Ordered lab now  -Blood pressure goal <140/90 - Microalbumin/creatinine goal < 30 -On enalapril 5 mg bid -diet changes including salt restriction -limit eating outside -counseled BP targets per standards of diabetes care -Uncontrolled blood pressure can lead to retinopathy, nephropathy and cardiovascular and atherosclerotic heart disease  Reviewed and counseled on: -A1C target -Blood sugar targets -Complications of uncontrolled diabetes  -Checking blood sugar before meals and bedtime and bring log next visit -All medications with mechanism of action and side effects -Hypoglycemia management: rule of 15's, Glucagon Emergency Kit and medical alert ID -low-carb low-fat plate-method diet -At least 20 minutes of physical activity per day -Annual dilated retinal eye exam and foot exam -compliance and follow up needs -follow up as scheduled or earlier if problem gets worse  Call if blood sugar is less than 70 or consistently above 250    Take a 15 gm snack of carbohydrate at bedtime before you go to sleep if your blood sugar is less than 100.    If you are going to fast after midnight for a test or procedure, ask your physician for instructions on how to reduce/decrease your insulin dose.    Call if blood sugar is less than 70 or consistently above 250  -Treating a low sugar by rule of 15  (15 gms of sugar every 15 min until sugar is more than 70) If you feel your sugar is low, test your sugar to be sure If your sugar is low (less than 70),  then take 15 grams of a fast acting Carbohydrate (3-4 glucose tablets or glucose gel or 4 ounces of juice or regular soda) Recheck your sugar 15 min after treating low to make sure it is more than 70 If sugar is still less than 70, treat again with 15 grams of carbohydrate          Don't drive the hour of hypoglycemia  If unconscious/unable to eat or drink by mouth, use glucagon injection or nasal spray  baqsimi and call 911. Can repeat again in 15 min if still unconscious.   I have reviewed current medications, nurse's notes, allergies, vital signs, past medical and surgical history, family medical history, and social history for this encounter. Counseled patient on symptoms, examination findings, lab findings, imaging results, treatment decisions and monitoring and prognosis. The patient understood the recommendations and agrees with the treatment plan. All questions regarding treatment plan were fully answered.  Return in about 1 month (around 07/15/2023).   Samuel Valentine 06/15/23    History of Present Illness Samuel Valentine is a 52 y.o. year old male who presents to our clinic for a new referral for Type 1 diabetes mellitus. Samuel Valentine was first diagnosed at age 58..  Current diabetic regimen: T-slim X2 and dexcom G6 Insulin Pump Settings Ambulatory BG Monitoring   CGM interpretation: At today's visit, we reviewed her CGM downloads. The full report is scanned in the media. Reviewing the CGM trends, BG mildly elevated in daytime with lunch and supper   Insulin pump  Pump downloaded and reviewed. Blood sugars reviewed Basal rates reviewed Control IQ mode % reviewed Manual mode % reviewed Sensor wear % reviewed Readings above target % reviewed Readings at target % reviewed Readings below target % reviewed TDD units of insulin /day reviewed Changing sets on time reviewed Calibration reviewed Carb counting reviewed Basal bolus ratio reviewed Basal % reviewed Bolus % reviewed Duration of pump suspends reviewed No reaction at insulin pump sites Set change=every 3 days Reservoir change = every 3 days   Physical Exam  BP (!) 150/83   Pulse 89   Ht 6' (1.829 m)   Wt 228 lb (103.4 kg)   SpO2 99%   BMI 30.92 kg/m    Constitutional: well developed, well nourished Head: normocephalic, atraumatic Eyes: sclera anicteric, no redness Neck: supple Lungs: normal  respiratory effort Neurology: alert and oriented Skin: dry, no appreciable rashes Musculoskeletal: no appreciable defects Psychiatric: normal mood and affect Diabetic Foot Exam - Simple   No data filed     Current Medications Patient's Medications  New Prescriptions   No medications on file  Previous Medications   ALBUTEROL (VENTOLIN HFA) 108 (90 BASE) MCG/ACT INHALER    Inhale into the lungs.  Inhale 2 puffs into the lungs every 4 hours as needed for Shortness of Breath, Wheezing or Other (Cough).   ASPIRIN 81 MG CHEWABLE TABLET    Chew 81 mg by mouth 2 (two) times daily.    CONTINUOUS GLUCOSE RECEIVER (DEXCOM G6 RECEIVER) DEVI    by Does not apply route.   CONTINUOUS GLUCOSE TRANSMITTER (DEXCOM G6 TRANSMITTER) MISC    1 Device by Does not apply route continuous.   CONTOUR NEXT TEST TEST STRIP    USE TO TEST BLOOD GLUCOSE 6 TO 8 TIMES AS DIRECTED   EMTRICITABINE-TENOFOVIR (TRUVADA) 200-300 MG TABLET    Take 1 tablet by mouth daily. This may be placebo and is study provided. He is also taking cabotegravir/placebo 30mg  daily.  ENALAPRIL (VASOTEC) 5 MG TABLET    TAKE 1 TABLET(5 MG) BY MOUTH TWICE DAILY   INSULIN GLARGINE (LANTUS) 100 UNIT/ML SOLOSTAR PEN    Inject 60 Units into the skin daily. Use in case of pump failure   KETOCONAZOLE (NIZORAL) 2 % CREAM    Apply 1 Application topically 2 (two) times daily.   LORAZEPAM (ATIVAN) 0.5 MG TABLET    Take 1 tablet by mouth daily as needed.   MULTIPLE VITAMIN (MULTIVITAMIN) TABLET    Take 1 tablet by mouth daily.     NAPROXEN (NAPROSYN) 500 MG TABLET    Take 1 pill po bid for 1-2 wks, then prn pain. Take with food.   NOVOLOG PENFILL CARTRIDGE    USE IN INSULIN PUMP AS DIRECTED(300 UNITS EVERY 2-3 DAYS)   VALACYCLOVIR (VALTREX) 1000 MG TABLET    2 pills twice a day at the first sign of cold sore  Modified Medications   Modified Medication Previous Medication   ATORVASTATIN (LIPITOR) 40 MG TABLET atorvastatin (LIPITOR) 10 MG tablet      Take 1  tablet (40 mg total) by mouth daily.    TAKE 1 TABLET BY MOUTH EVERY DAY   CONTINUOUS GLUCOSE SENSOR (DEXCOM G6 SENSOR) MISC Continuous Glucose Sensor (DEXCOM G6 SENSOR) MISC      Apply 1 Units topically See admin instructions. Change sensor every 10 days    Apply 1 Units topically See admin instructions. Change sensor every 10 days   INSULIN ASPART (NOVOLOG) 100 UNIT/ML INJECTION insulin aspart (NOVOLOG) 100 UNIT/ML injection      ADMINISTER 300 UNITS VIA INSULIN PUMP EVERY 48 HOURS    ADMINISTER 300 UNITS VIA INSULIN PUMP EVERY 48 HOURS  Discontinued Medications   GLUCAGON (GLUCAGON EMERGENCY) 1 MG INJECTION    Inject 1 mg into the vein once as needed.   GLUCAGON (GVOKE HYPOPEN 2-PACK) 1 MG/0.2ML SOAJ    Inject into the skin.  Inject 1 mg into the skin as needed (urgent hypoglycemia).   NOVOLOG 100 UNIT/ML INJECTION    ADMINISTER 300 UNITS VIA INSULIN PUMP EVERY 48 HOURS    Allergies Allergies  Allergen Reactions   Nirmatrelvir-Ritonavir Diarrhea, Nausea And Vomiting, Nausea Only and Other (See Comments)    Nausea, bradycardia, abdominal pain,    Penicillins     Past Medical History Past Medical History:  Diagnosis Date   Chronic hepatitis B (HCC)    followed at [facility]   Hypercholesterolemia    Type 1 diabetes (HCC)    Dr. Fransico Michael    Past Surgical History History reviewed. No pertinent surgical history.  Family History family history is not on file.  Social History Social History   Socioeconomic History   Marital status: Married    Spouse name: Not on file   Number of children: Not on file   Years of education: Not on file   Highest education level: Not on file  Occupational History   Occupation: data analyst    Employer: [employer]  Tobacco Use   Smoking status: Never   Smokeless tobacco: Never  Substance and Sexual Activity   Alcohol use: Yes    Comment: maybe one drink per month   Drug use: No   Sexual activity: Yes    Partners: Male  Other Topics  Concern   Not on file  Social History Narrative   Same sex marriage, 2 cats   Social Determinants of Health   Financial Resource Strain: Not on file  Food Insecurity: Not on  file  Transportation Needs: Not on file  Physical Activity: Not on file  Stress: Not on file  Social Connections: Not on file  Intimate Partner Violence: Not on file (05/22/2022)    Lab Results  Component Value Date   HGBA1C 6.7 (A) 06/15/2023   Lab Results  Component Value Date   CHOL 175 02/24/2023   Lab Results  Component Value Date   HDL 71.40 02/24/2023   Lab Results  Component Value Date   LDLCALC 92 02/24/2023   Lab Results  Component Value Date   TRIG 55.0 02/24/2023   Lab Results  Component Value Date   CHOLHDL 2 02/24/2023   Lab Results  Component Value Date   CREATININE 1.21 05/04/2018   Lab Results  Component Value Date   MICROALBUR <0.7 02/24/2023    Parts of this note may have been dictated using voice recognition software. There may be variances in spelling and vocabulary which are unintentional. Not all errors are proofread. Please notify the [facility] Parkin if any discrepancies are noted or if the meaning of any statement is not clear.

## 2023-06-15 NOTE — Patient Instructions (Signed)

## 2023-06-16 ENCOUNTER — Encounter: Payer: Self-pay | Admitting: "Endocrinology

## 2023-06-16 ENCOUNTER — Encounter: Payer: Self-pay | Admitting: Family Medicine

## 2023-06-16 NOTE — Telephone Encounter (Signed)
Had a long conversation with the patient about his experience.  Understood all the concerns of the patient regarding his care and addressed the care gaps as much as possible on the phone.  Patient notified that his blood sugars had been running high all night and wanted to change his settings back to where they were.  Patient says that he is very well versed with adjustment on his pumps and feels confident in making those changes by himself.  Walked the patient through all the changes on the pump settings that we made yesterday to reverse them.  Phone call duration 13 minutes

## 2023-06-17 ENCOUNTER — Other Ambulatory Visit: Payer: Self-pay

## 2023-06-17 DIAGNOSIS — E109 Type 1 diabetes mellitus without complications: Secondary | ICD-10-CM

## 2023-06-22 ENCOUNTER — Telehealth: Payer: Self-pay

## 2023-06-22 NOTE — Telephone Encounter (Signed)
Fax from [facility] for a refill on Truvada last apt 04/12/23.

## 2023-06-23 ENCOUNTER — Encounter: Payer: Self-pay | Admitting: Family Medicine

## 2023-06-23 ENCOUNTER — Ambulatory Visit: Payer: BC Managed Care – PPO | Admitting: Family Medicine

## 2023-06-23 VITALS — BP 118/64 | HR 92 | Wt 225.4 lb

## 2023-06-23 DIAGNOSIS — Z209 Contact with and (suspected) exposure to unspecified communicable disease: Secondary | ICD-10-CM

## 2023-06-23 DIAGNOSIS — Z23 Encounter for immunization: Secondary | ICD-10-CM

## 2023-06-23 DIAGNOSIS — B078 Other viral warts: Secondary | ICD-10-CM

## 2023-06-23 NOTE — Progress Notes (Signed)
   Subjective:    Patient ID: Samuel Valentine, male    DOB: 09/14/1971, 52 y.o.   MRN: [id number]  HPI He is here for an interval evaluation.  He does need STD testing to follow-up on his Truvada.  He is having no difficulty with that.  He now has a new endocrinologist as he did have difficulty communicating with Dr. Roosevelt Locks.  He also has a lesion on the pubic area that he would like me to look at.  He also would like his immunizations updated.  He apparently has had other immunizations done when he is in [place] and will get a copy of them for our records.   Review of Systems     Objective:    Physical Exam A very small wart is noted in the pubic area.       Assessment & Plan:   Problem List Items Addressed This Visit   None Visit Diagnoses     Contact with or exposure to communicable disease    -  Primary   Relevant Orders   RPR+HIV+GC+CT Panel   Need for COVID-19 vaccine       Relevant Orders   Pfizer Comirnaty Covid -19 Vaccine 27yrs and older (Completed)   Need for influenza vaccination       Relevant Orders   Flu vaccine trivalent PF, 6mos and older(Flulaval,Afluria,Fluarix,Fluzone) (Completed)   Other viral warts         The wart was hyfrecated without difficulty.  He tolerated the procedure well.

## 2023-06-26 LAB — RPR+HIV+GC+CT PANEL
HIV Screen 4th Generation wRfx: NONREACTIVE
RPR Ser Ql: NONREACTIVE

## 2023-06-27 ENCOUNTER — Other Ambulatory Visit: Payer: Self-pay | Admitting: Family Medicine

## 2023-06-27 DIAGNOSIS — Z209 Contact with and (suspected) exposure to unspecified communicable disease: Secondary | ICD-10-CM

## 2023-06-27 MED ORDER — EMTRICITABINE-TENOFOVIR DF 200-300 MG PO TABS: 1.0000 | ORAL_TABLET | Freq: Every day | ORAL | 0 refills | Status: DC

## 2023-06-28 ENCOUNTER — Encounter: Payer: Self-pay | Admitting: Family Medicine

## 2023-06-29 ENCOUNTER — Telehealth: Payer: Self-pay | Admitting: Family Medicine

## 2023-06-29 ENCOUNTER — Other Ambulatory Visit: Payer: Self-pay

## 2023-06-29 DIAGNOSIS — R7989 Other specified abnormal findings of blood chemistry: Secondary | ICD-10-CM

## 2023-06-29 DIAGNOSIS — Z209 Contact with and (suspected) exposure to unspecified communicable disease: Secondary | ICD-10-CM

## 2023-06-29 MED ORDER — EMTRICITABINE-TENOFOVIR DF 200-300 MG PO TABS
1.0000 | ORAL_TABLET | Freq: Every day | ORAL | 0 refills | Status: DC
Start: 2023-06-29 — End: 2023-09-14

## 2023-06-29 NOTE — Telephone Encounter (Signed)
Fax from [facility]  Emtricitabine/tenofovir df 200/300 mg  # 90    This rx has to go to [facility] per pt

## 2023-06-29 NOTE — Telephone Encounter (Signed)
I have sent to [facility].

## 2023-06-30 ENCOUNTER — Other Ambulatory Visit: Payer: BC Managed Care – PPO

## 2023-06-30 DIAGNOSIS — R7989 Other specified abnormal findings of blood chemistry: Secondary | ICD-10-CM

## 2023-07-01 LAB — TESTOSTERONE: Testosterone: 801 ng/dL (ref 264–916)

## 2023-07-12 ENCOUNTER — Emergency Department (HOSPITAL_COMMUNITY): Payer: BC Managed Care – PPO

## 2023-07-12 ENCOUNTER — Inpatient Hospital Stay (HOSPITAL_COMMUNITY)
Admission: EM | Admit: 2023-07-12 | Discharge: 2023-07-15 | DRG: 418 | Disposition: A | Discharge status: Home / Self Care | Payer: BC Managed Care – PPO | Attending: Internal Medicine | Admitting: Internal Medicine

## 2023-07-12 ENCOUNTER — Other Ambulatory Visit: Payer: Self-pay

## 2023-07-12 DIAGNOSIS — E1065 Type 1 diabetes mellitus with hyperglycemia: Secondary | ICD-10-CM | POA: Diagnosis present

## 2023-07-12 DIAGNOSIS — Z7982 Long term (current) use of aspirin: Secondary | ICD-10-CM

## 2023-07-12 DIAGNOSIS — R1012 Left upper quadrant pain: Secondary | ICD-10-CM | POA: Diagnosis not present

## 2023-07-12 DIAGNOSIS — E66811 Obesity, class 1: Secondary | ICD-10-CM | POA: Diagnosis present

## 2023-07-12 DIAGNOSIS — K8000 Calculus of gallbladder with acute cholecystitis without obstruction: Secondary | ICD-10-CM | POA: Diagnosis not present

## 2023-07-12 DIAGNOSIS — Z880 Allergy status to penicillin: Secondary | ICD-10-CM

## 2023-07-12 DIAGNOSIS — I10 Essential (primary) hypertension: Secondary | ICD-10-CM | POA: Diagnosis present

## 2023-07-12 DIAGNOSIS — K567 Ileus, unspecified: Secondary | ICD-10-CM | POA: Diagnosis not present

## 2023-07-12 DIAGNOSIS — Z9641 Presence of insulin pump (external) (internal): Secondary | ICD-10-CM | POA: Diagnosis present

## 2023-07-12 DIAGNOSIS — K660 Peritoneal adhesions (postprocedural) (postinfection): Secondary | ICD-10-CM | POA: Diagnosis present

## 2023-07-12 DIAGNOSIS — E7800 Pure hypercholesterolemia, unspecified: Secondary | ICD-10-CM | POA: Diagnosis present

## 2023-07-12 DIAGNOSIS — Z79899 Other long term (current) drug therapy: Secondary | ICD-10-CM

## 2023-07-12 DIAGNOSIS — E1043 Type 1 diabetes mellitus with diabetic autonomic (poly)neuropathy: Secondary | ICD-10-CM | POA: Diagnosis present

## 2023-07-12 DIAGNOSIS — B181 Chronic viral hepatitis B without delta-agent: Secondary | ICD-10-CM | POA: Diagnosis present

## 2023-07-12 DIAGNOSIS — R911 Solitary pulmonary nodule: Secondary | ICD-10-CM | POA: Diagnosis present

## 2023-07-12 DIAGNOSIS — K810 Acute cholecystitis: Principal | ICD-10-CM | POA: Diagnosis present

## 2023-07-12 DIAGNOSIS — Z6830 Body mass index (BMI) 30.0-30.9, adult: Secondary | ICD-10-CM

## 2023-07-12 DIAGNOSIS — Z888 Allergy status to other drugs, medicaments and biological substances status: Secondary | ICD-10-CM

## 2023-07-12 DIAGNOSIS — K82A1 Gangrene of gallbladder in cholecystitis: Secondary | ICD-10-CM | POA: Diagnosis present

## 2023-07-12 DIAGNOSIS — Z794 Long term (current) use of insulin: Secondary | ICD-10-CM

## 2023-07-12 DIAGNOSIS — K3184 Gastroparesis: Secondary | ICD-10-CM | POA: Diagnosis present

## 2023-07-12 LAB — CBC WITH DIFFERENTIAL/PLATELET
Abs Immature Granulocytes: 0.04 10*3/uL (ref 0.00–0.07)
Basophils Absolute: 0 10*3/uL (ref 0.0–0.1)
Basophils Relative: 0 %
Eosinophils Absolute: 0 10*3/uL (ref 0.0–0.5)
Eosinophils Relative: 0 %
HCT: 40.7 % (ref 39.0–52.0)
Hemoglobin: 13.7 g/dL (ref 13.0–17.0)
Immature Granulocytes: 0 %
Lymphocytes Relative: 22 %
Lymphs Abs: 2.1 10*3/uL (ref 0.7–4.0)
MCH: 31.3 pg (ref 26.0–34.0)
MCHC: 33.7 g/dL (ref 30.0–36.0)
MCV: 92.9 fL (ref 80.0–100.0)
Monocytes Absolute: 0.7 10*3/uL (ref 0.1–1.0)
Monocytes Relative: 8 %
Neutro Abs: 6.4 10*3/uL (ref 1.7–7.7)
Neutrophils Relative %: 70 %
Platelets: 213 10*3/uL (ref 150–400)
RBC: 4.38 MIL/uL (ref 4.22–5.81)
RDW: 13.2 % (ref 11.5–15.5)
WBC: 9.3 10*3/uL (ref 4.0–10.5)
nRBC: 0 % (ref 0.0–0.2)

## 2023-07-12 LAB — URINALYSIS, ROUTINE W REFLEX MICROSCOPIC
Bilirubin Urine: NEGATIVE
Glucose, UA: NEGATIVE mg/dL
Hgb urine dipstick: NEGATIVE
Ketones, ur: 20 mg/dL — AB
Leukocytes,Ua: NEGATIVE
Nitrite: NEGATIVE
Protein, ur: NEGATIVE mg/dL
Specific Gravity, Urine: 1.013 (ref 1.005–1.030)
pH: 6 (ref 5.0–8.0)

## 2023-07-12 LAB — COMPREHENSIVE METABOLIC PANEL
ALT: 18 U/L (ref 0–44)
AST: 18 U/L (ref 15–41)
Albumin: 3.8 g/dL (ref 3.5–5.0)
Alkaline Phosphatase: 62 U/L (ref 38–126)
Anion gap: 8 (ref 5–15)
BUN: 7 mg/dL (ref 6–20)
CO2: 25 mmol/L (ref 22–32)
Calcium: 9 mg/dL (ref 8.9–10.3)
Chloride: 102 mmol/L (ref 98–111)
Creatinine, Ser: 1.14 mg/dL (ref 0.61–1.24)
GFR, Estimated: >60 mL/min (ref >60)
Glucose, Bld: 217 mg/dL — ABNORMAL HIGH (ref 70–99)
Potassium: 4 mmol/L (ref 3.5–5.1)
Sodium: 135 mmol/L (ref 135–145)
Total Bilirubin: 1 mg/dL (ref 0.3–1.2)
Total Protein: 6.7 g/dL (ref 6.5–8.1)

## 2023-07-12 LAB — LIPASE, BLOOD: Lipase: 23 U/L (ref 11–51)

## 2023-07-12 MED ORDER — ONDANSETRON HCL 4 MG/2ML IJ SOLN
4.0000 mg | Freq: Once | INTRAMUSCULAR | Status: AC
  Administered 2023-07-12: 4 mg via INTRAVENOUS
  Filled 2023-07-12: qty 2

## 2023-07-12 MED ORDER — IOHEXOL 350 MG/ML SOLN
75.0000 mL | Freq: Once | INTRAVENOUS | Status: AC | PRN
  Administered 2023-07-12: 75 mL via INTRAVENOUS

## 2023-07-12 MED ORDER — ONDANSETRON 4 MG PO TBDP
4.0000 mg | ORAL_TABLET | Freq: Once | ORAL | Status: AC
  Administered 2023-07-12: 4 mg via ORAL
  Filled 2023-07-12: qty 1

## 2023-07-12 MED ORDER — HYDROMORPHONE HCL 1 MG/ML IJ SOLN
1.0000 mg | Freq: Once | INTRAMUSCULAR | Status: AC
  Administered 2023-07-12: 1 mg via INTRAVENOUS
  Filled 2023-07-12: qty 1

## 2023-07-12 MED ORDER — SODIUM CHLORIDE 0.9 % IV SOLN
2.0000 g | Freq: Once | INTRAVENOUS | Status: AC
  Administered 2023-07-13: 2 g via INTRAVENOUS
  Filled 2023-07-12: qty 20

## 2023-07-12 MED ORDER — SODIUM CHLORIDE 0.9 % IV BOLUS
1000.0000 mL | Freq: Once | INTRAVENOUS | Status: AC
  Administered 2023-07-13: 1000 mL via INTRAVENOUS

## 2023-07-12 MED ORDER — METRONIDAZOLE 500 MG/100ML IV SOLN
500.0000 mg | Freq: Once | INTRAVENOUS | Status: AC
  Administered 2023-07-13: 500 mg via INTRAVENOUS
  Filled 2023-07-12: qty 100

## 2023-07-12 NOTE — ED Notes (Signed)
Request re-evaluation for vomiting.   Brought back to triage and given antiemetics and PIV placement for CT.

## 2023-07-12 NOTE — ED Provider Triage Note (Signed)
Emergency Medicine Provider Triage Evaluation Note  Samuel Valentine , a 52 y.o. male  was evaluated in triage.  Pt complains of abdominal pain.  Onset Sunday after eating cabbage soup and chip dip.  With vomiting.  Is able to tolerate crackers and water today.  Did have bowel movement today.  No blood in stools or emesis.  Denies fevers.  Pain somewhat improved for the moment.  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  Review of Systems  Positive:  Negative:   Physical Exam  BP 131/87 (BP Location: Right Arm)   Pulse 88   Temp 98.4 F (36.9 C) (Oral)   Resp 18   SpO2 97%  Gen:   Awake, no distress   Resp:  Normal effort  MSK:   Moves extremities without difficulty  Other:  LUQ TTP  Medical Decision Making  Medically screening exam initiated at 6:32 PM.  Appropriate orders placed.  Teghan Schneekloth was informed that the remainder of the evaluation will be completed by another provider, this initial triage assessment does not replace that evaluation, and the importance of remaining in the ED until their evaluation is complete.     Jeannie Fend, PA-C 07/12/23 [id number]

## 2023-07-12 NOTE — ED Triage Notes (Signed)
Patient with abdominal cramping on L>R since Sunday. Vomiting Sunday night into Monday but no vomiting today. Tolerating mild foods today but pain still present.

## 2023-07-12 NOTE — ED Provider Notes (Signed)
[facility] Emergency Department Provider Note MRN:  [id number]  Arrival date & time: 07/12/23     Chief Complaint   Abdominal Pain   History of Present Illness   Samuel Valentine is a 52 y.o. year-old male with a history of type 1 diabetes presenting to the ED with chief complaint of abdominal pain.  Abdominal pain after eating last night.  Mostly in the left upper quadrant but some in the right upper quadrant.  Associated with nausea vomiting  Review of Systems  A thorough review of systems was obtained and all systems are negative except as noted in the HPI and PMH.   Patient's Health History    Past Medical History:  Diagnosis Date   Chronic hepatitis B (HCC)    followed at [facility]   Hypercholesterolemia    Type 1 diabetes (HCC)    Dr. Fransico Jaxsen Bernhart    No past surgical history on file.  No family history on file.  Social History   Socioeconomic History   Marital status: Married    Spouse name: Not on file   Number of children: Not on file   Years of education: Not on file   Highest education level: Bachelor's degree (e.g., BA, AB, BS)  Occupational History   Occupation: Programmer, multimedia: [employer]  Tobacco Use   Smoking status: Never   Smokeless tobacco: Never  Substance and Sexual Activity   Alcohol use: Yes    Comment: maybe one drink per month   Drug use: No   Sexual activity: Yes    Partners: Male  Other Topics Concern   Not on file  Social History Narrative   Same sex marriage, 2 cats   Social Determinants of Health   Financial Resource Strain: Low Risk  (06/22/2023)   Overall Financial Resource Strain (CARDIA)    Difficulty of Paying Living Expenses: Not very hard  Food Insecurity: No Food Insecurity (06/22/2023)   Hunger Vital Sign    Worried About Running Out of Food in the Last Year: Never true    Ran Out of Food in the Last Year: Never true  Transportation Needs: No Transportation Needs (06/22/2023)   PRAPARE -  Administrator, Civil Service (Medical): No    Lack of Transportation (Non-Medical): No  Physical Activity: Insufficiently Active (06/22/2023)   Exercise Vital Sign    Days of Exercise per Week: 3 days    Minutes of Exercise per Session: 20 min  Stress: Stress Concern Present (06/22/2023)   [employer] of Occupational Health - Occupational Stress Questionnaire    Feeling of Stress : To some extent  Social Connections: Socially Isolated (06/22/2023)   Social Connection and Isolation Panel [NHANES]    Frequency of Communication with Friends and Family: Never    Frequency of Social Gatherings with Friends and Family: Once a week    Attends Religious Services: Never    Database administrator or Organizations: No    Attends Engineer, structural: Not on file    Marital Status: Married  Intimate Partner Violence: Not on file (05/22/2022)     Physical Exam   Vitals:   07/12/23 1817  BP: 131/87  Pulse: 88  Resp: 18  Temp: 98.4 F (36.9 C)  SpO2: 97%    CONSTITUTIONAL: Well-appearing, mild distress due to pain NEURO/PSYCH:  Alert and oriented x 3, no focal deficits EYES:  eyes equal and reactive ENT/NECK:  no LAD, no JVD CARDIO:  Regular rate, well-perfused, normal S1 and S2 PULM:  CTAB no wheezing or rhonchi GI/GU:  non-distended, moderate upper abdominal tenderness MSK/SPINE:  No gross deformities, no edema SKIN:  no rash, atraumatic   *Additional and/or pertinent findings included in MDM below  Diagnostic and Interventional Summary    EKG Interpretation Date/Time:    Ventricular Rate:    PR Interval:    QRS Duration:    QT Interval:    QTC Calculation:   R Axis:      Text Interpretation:         Labs Reviewed  COMPREHENSIVE METABOLIC PANEL - Abnormal; Notable for the following components:      Result Value   Glucose, Bld 217 (*)    All other components within normal limits  URINALYSIS, ROUTINE W REFLEX MICROSCOPIC - Abnormal; Notable for  the following components:   Ketones, ur 20 (*)    All other components within normal limits  CBC WITH DIFFERENTIAL/PLATELET  LIPASE, BLOOD    CT ABDOMEN PELVIS W CONTRAST  Final Result      Medications  HYDROmorphone (DILAUDID) injection 1 mg (has no administration in time range)  ondansetron (ZOFRAN) injection 4 mg (has no administration in time range)  sodium chloride 0.9 % bolus 1,000 mL (has no administration in time range)  cefTRIAXone (ROCEPHIN) 2 g in sodium chloride 0.9 % 100 mL IVPB (has no administration in time range)  metroNIDAZOLE (FLAGYL) IVPB 500 mg (has no administration in time range)  ondansetron (ZOFRAN-ODT) disintegrating tablet 4 mg (4 mg Oral Given 07/12/23 1835)  ondansetron (ZOFRAN-ODT) disintegrating tablet 4 mg (4 mg Oral Given 07/12/23 2012)  iohexol (OMNIPAQUE) 350 MG/ML injection 75 mL (75 mLs Intravenous Contrast Given 07/12/23 2128)     Procedures  /  Critical Care .Critical Care  Performed by: Sabas Sous, MD Authorized by: Sabas Sous, MD   Critical care provider statement:    Critical care time (minutes):  35   Critical care was necessary to treat or prevent imminent or life-threatening deterioration of the following conditions: Acute cholecystitis.   Critical care was time spent personally by me on the following activities:  Development of treatment plan with patient or surrogate, discussions with consultants, evaluation of patient's response to treatment, examination of patient, ordering and review of laboratory studies, ordering and review of radiographic studies, ordering and performing treatments and interventions, pulse oximetry, re-evaluation of patient's condition and review of old charts   ED Course and Medical Decision Making  Initial Impression and Ddx Differential diagnosis includes GERD, cholecystitis, pancreatitis, ulcer.  Past medical/surgical history that increases complexity of ED encounter: Type 1  diabetes  Interpretation of Diagnostics I personally reviewed the laboratory assessment and my interpretation is as follows: No significant blood count or electrolyte disturbance  CT evaluation reveals likely cholecystitis  Patient Reassessment and Ultimate Disposition/Management     Consulting general surgery, providing antibiotics.  Anticipating admission for surgery.  Patient management required discussion with the following services or consulting groups:  General/Trauma Surgery  Complexity of Problems Addressed Acute illness or injury that poses threat of life of bodily function  Additional Data Reviewed and Analyzed Further history obtained from: Further history from spouse/family member  Additional Factors Impacting ED Encounter Risk Use of parenteral controlled substances and Consideration of hospitalization  Elmer Sow. Pilar Plate, MD [facility] Emergency Medicine [facility] [email or web address]  Final Clinical Impressions(s) / ED Diagnoses     ICD-10-CM   1. Acute cholecystitis  K81.0  ED Discharge Orders     None        Discharge Instructions Discussed with and Provided to Patient:   Discharge Instructions   None      Sabas Sous, MD 07/12/23 2350

## 2023-07-12 NOTE — ED Notes (Signed)
Pt brought back to triage to speak in private. Verbally aggressive and dissatisfied with his wait times. De-escalation techniques implemented but patient agitation increases.   Says we can take his IV out or he will. Encouraged to wait until CT results but pt demanding supervisor. Notification made as requested.

## 2023-07-13 ENCOUNTER — Encounter (HOSPITAL_COMMUNITY): Payer: Self-pay | Admitting: Internal Medicine

## 2023-07-13 ENCOUNTER — Ambulatory Visit: Payer: BC Managed Care – PPO | Admitting: Family Medicine

## 2023-07-13 ENCOUNTER — Other Ambulatory Visit: Payer: Self-pay

## 2023-07-13 ENCOUNTER — Encounter (HOSPITAL_COMMUNITY)
Admission: EM | Disposition: A | Discharge status: Home / Self Care | Payer: Self-pay | Source: Home / Self Care | Attending: Internal Medicine

## 2023-07-13 ENCOUNTER — Inpatient Hospital Stay (HOSPITAL_COMMUNITY): Payer: BC Managed Care – PPO

## 2023-07-13 DIAGNOSIS — Z79899 Other long term (current) drug therapy: Secondary | ICD-10-CM | POA: Diagnosis not present

## 2023-07-13 DIAGNOSIS — K810 Acute cholecystitis: Secondary | ICD-10-CM | POA: Diagnosis not present

## 2023-07-13 DIAGNOSIS — I10 Essential (primary) hypertension: Secondary | ICD-10-CM | POA: Diagnosis present

## 2023-07-13 DIAGNOSIS — K8000 Calculus of gallbladder with acute cholecystitis without obstruction: Secondary | ICD-10-CM | POA: Diagnosis present

## 2023-07-13 DIAGNOSIS — Z6830 Body mass index (BMI) 30.0-30.9, adult: Secondary | ICD-10-CM | POA: Diagnosis not present

## 2023-07-13 DIAGNOSIS — K3184 Gastroparesis: Secondary | ICD-10-CM | POA: Diagnosis present

## 2023-07-13 DIAGNOSIS — B181 Chronic viral hepatitis B without delta-agent: Secondary | ICD-10-CM | POA: Diagnosis present

## 2023-07-13 DIAGNOSIS — K82A1 Gangrene of gallbladder in cholecystitis: Secondary | ICD-10-CM | POA: Diagnosis present

## 2023-07-13 DIAGNOSIS — Z794 Long term (current) use of insulin: Secondary | ICD-10-CM | POA: Diagnosis not present

## 2023-07-13 DIAGNOSIS — Z9641 Presence of insulin pump (external) (internal): Secondary | ICD-10-CM | POA: Diagnosis present

## 2023-07-13 DIAGNOSIS — E66811 Obesity, class 1: Secondary | ICD-10-CM | POA: Diagnosis present

## 2023-07-13 DIAGNOSIS — K660 Peritoneal adhesions (postprocedural) (postinfection): Secondary | ICD-10-CM | POA: Diagnosis present

## 2023-07-13 DIAGNOSIS — E1065 Type 1 diabetes mellitus with hyperglycemia: Secondary | ICD-10-CM | POA: Diagnosis present

## 2023-07-13 DIAGNOSIS — Z7982 Long term (current) use of aspirin: Secondary | ICD-10-CM | POA: Diagnosis not present

## 2023-07-13 DIAGNOSIS — R911 Solitary pulmonary nodule: Secondary | ICD-10-CM | POA: Diagnosis present

## 2023-07-13 DIAGNOSIS — E7800 Pure hypercholesterolemia, unspecified: Secondary | ICD-10-CM | POA: Diagnosis present

## 2023-07-13 DIAGNOSIS — R1012 Left upper quadrant pain: Secondary | ICD-10-CM | POA: Diagnosis present

## 2023-07-13 DIAGNOSIS — E1043 Type 1 diabetes mellitus with diabetic autonomic (poly)neuropathy: Secondary | ICD-10-CM | POA: Diagnosis present

## 2023-07-13 DIAGNOSIS — Z880 Allergy status to penicillin: Secondary | ICD-10-CM | POA: Diagnosis not present

## 2023-07-13 DIAGNOSIS — Z888 Allergy status to other drugs, medicaments and biological substances status: Secondary | ICD-10-CM | POA: Diagnosis not present

## 2023-07-13 DIAGNOSIS — K567 Ileus, unspecified: Secondary | ICD-10-CM | POA: Diagnosis not present

## 2023-07-13 HISTORY — PX: CHOLECYSTECTOMY: SHX55

## 2023-07-13 LAB — CBC
HCT: 40.6 % (ref 39.0–52.0)
Hemoglobin: 13.6 g/dL (ref 13.0–17.0)
MCH: 31.8 pg (ref 26.0–34.0)
MCHC: 33.5 g/dL (ref 30.0–36.0)
MCV: 94.9 fL (ref 80.0–100.0)
Platelets: 189 10*3/uL (ref 150–400)
RBC: 4.28 MIL/uL (ref 4.22–5.81)
RDW: 13.1 % (ref 11.5–15.5)
WBC: 9.5 10*3/uL (ref 4.0–10.5)
nRBC: 0 % (ref 0.0–0.2)

## 2023-07-13 LAB — COMPREHENSIVE METABOLIC PANEL
ALT: 28 U/L (ref 0–44)
AST: 51 U/L — ABNORMAL HIGH (ref 15–41)
Albumin: 3.5 g/dL (ref 3.5–5.0)
Alkaline Phosphatase: 74 U/L (ref 38–126)
Anion gap: 14 (ref 5–15)
BUN: 10 mg/dL (ref 6–20)
CO2: 21 mmol/L — ABNORMAL LOW (ref 22–32)
Calcium: 8.7 mg/dL — ABNORMAL LOW (ref 8.9–10.3)
Chloride: 98 mmol/L (ref 98–111)
Creatinine, Ser: 1.13 mg/dL (ref 0.61–1.24)
GFR, Estimated: >60 mL/min (ref >60)
Glucose, Bld: 232 mg/dL — ABNORMAL HIGH (ref 70–99)
Potassium: 4 mmol/L (ref 3.5–5.1)
Sodium: 133 mmol/L — ABNORMAL LOW (ref 135–145)
Total Bilirubin: 1.2 mg/dL (ref 0.3–1.2)
Total Protein: 6.5 g/dL (ref 6.5–8.1)

## 2023-07-13 LAB — GLUCOSE, CAPILLARY
Glucose-Capillary: 212 mg/dL — ABNORMAL HIGH (ref 70–99)
Glucose-Capillary: 227 mg/dL — ABNORMAL HIGH (ref 70–99)
Glucose-Capillary: 285 mg/dL — ABNORMAL HIGH (ref 70–99)
Glucose-Capillary: 283 mg/dL — ABNORMAL HIGH (ref 70–99)
Glucose-Capillary: 327 mg/dL — ABNORMAL HIGH (ref 70–99)
Glucose-Capillary: 301 mg/dL — ABNORMAL HIGH (ref 70–99)
Glucose-Capillary: 300 mg/dL — ABNORMAL HIGH (ref 70–99)

## 2023-07-13 LAB — PHOSPHORUS: Phosphorus: 3.3 mg/dL (ref 2.5–4.6)

## 2023-07-13 LAB — MAGNESIUM: Magnesium: 1.8 mg/dL (ref 1.7–2.4)

## 2023-07-13 SURGERY — LAPAROSCOPIC CHOLECYSTECTOMY
Anesthesia: General

## 2023-07-13 MED ORDER — SODIUM CHLORIDE 0.9 % IV SOLN: INTRAVENOUS | Status: AC

## 2023-07-13 MED ORDER — HYDROMORPHONE HCL 1 MG/ML IJ SOLN
0.5000 mg | INTRAMUSCULAR | Status: AC
  Administered 2023-07-13: 0.5 mg via INTRAVENOUS
  Filled 2023-07-13: qty 1

## 2023-07-13 MED ORDER — LIDOCAINE 2% (20 MG/ML) 5 ML SYRINGE
INTRAMUSCULAR | Status: DC | PRN
  Administered 2023-07-13: 60 mg via INTRAVENOUS

## 2023-07-13 MED ORDER — ROCURONIUM BROMIDE 10 MG/ML (PF) SYRINGE
PREFILLED_SYRINGE | INTRAVENOUS | Status: DC | PRN
  Administered 2023-07-13: 50 mg via INTRAVENOUS

## 2023-07-13 MED ORDER — LIDOCAINE 2% (20 MG/ML) 5 ML SYRINGE
INTRAMUSCULAR | Status: AC
  Filled 2023-07-13: qty 5

## 2023-07-13 MED ORDER — DEXAMETHASONE SODIUM PHOSPHATE 10 MG/ML IJ SOLN
INTRAMUSCULAR | Status: AC
Start: 2023-07-13 — End: ?
  Filled 2023-07-13: qty 1

## 2023-07-13 MED ORDER — BUPIVACAINE-EPINEPHRINE (PF) 0.25% -1:200000 IJ SOLN
INTRAMUSCULAR | Status: AC
  Filled 2023-07-13: qty 30

## 2023-07-13 MED ORDER — AMISULPRIDE (ANTIEMETIC) 5 MG/2ML IV SOLN
INTRAVENOUS | Status: AC
  Filled 2023-07-13: qty 4

## 2023-07-13 MED ORDER — FENTANYL CITRATE (PF) 100 MCG/2ML IJ SOLN
25.0000 ug | INTRAMUSCULAR | Status: DC | PRN
  Administered 2023-07-13: 50 ug via INTRAVENOUS

## 2023-07-13 MED ORDER — HEMOSTATIC AGENTS (NO CHARGE) OPTIME
TOPICAL | Status: DC | PRN
  Administered 2023-07-13: 1 via TOPICAL

## 2023-07-13 MED ORDER — LABETALOL HCL 5 MG/ML IV SOLN
5.0000 mg | INTRAVENOUS | Status: AC
  Administered 2023-07-13: 5 mg via INTRAVENOUS
  Filled 2023-07-13: qty 4

## 2023-07-13 MED ORDER — HYDROMORPHONE HCL 1 MG/ML IJ SOLN
INTRAMUSCULAR | Status: DC | PRN
Start: 2023-07-13 — End: 2023-07-13
  Administered 2023-07-13: .5 mg via INTRAVENOUS

## 2023-07-13 MED ORDER — ONDANSETRON HCL 4 MG/2ML IJ SOLN
INTRAMUSCULAR | Status: AC
  Filled 2023-07-13: qty 4

## 2023-07-13 MED ORDER — METRONIDAZOLE 500 MG/100ML IV SOLN
500.0000 mg | Freq: Two times a day (BID) | INTRAVENOUS | Status: DC
Start: 2023-07-13 — End: 2023-07-15
  Administered 2023-07-13 – 2023-07-15 (×5): 500 mg via INTRAVENOUS
  Filled 2023-07-13 (×5): qty 100

## 2023-07-13 MED ORDER — ESMOLOL HCL 100 MG/10ML IV SOLN
INTRAVENOUS | Status: DC | PRN
Start: 2023-07-13 — End: 2023-07-13
  Administered 2023-07-13: 30 mg via INTRAVENOUS

## 2023-07-13 MED ORDER — AMISULPRIDE (ANTIEMETIC) 5 MG/2ML IV SOLN
10.0000 mg | Freq: Once | INTRAVENOUS | Status: AC
  Administered 2023-07-13: 10 mg via INTRAVENOUS

## 2023-07-13 MED ORDER — MELATONIN 5 MG PO TABS
5.0000 mg | ORAL_TABLET | Freq: Every evening | ORAL | Status: DC | PRN
  Administered 2023-07-14: 5 mg via ORAL
  Filled 2023-07-13: qty 1

## 2023-07-13 MED ORDER — INSULIN ASPART 100 UNIT/ML IJ SOLN
INTRAMUSCULAR | Status: AC
  Administered 2023-07-13: 4 [IU] via SUBCUTANEOUS
  Filled 2023-07-13: qty 1

## 2023-07-13 MED ORDER — STERILE WATER FOR IRRIGATION IR SOLN
Status: DC | PRN
  Administered 2023-07-13: 1000 mL

## 2023-07-13 MED ORDER — SPY AGENT GREEN - (INDOCYANINE FOR INJECTION)
1.2500 mg | Freq: Once | INTRAMUSCULAR | Status: AC
  Administered 2023-07-13: 1.25 mg via INTRAVENOUS
  Filled 2023-07-13: qty 10

## 2023-07-13 MED ORDER — CHLORHEXIDINE GLUCONATE 0.12 % MT SOLN
15.0000 mL | OROMUCOSAL | Status: AC
  Administered 2023-07-13: 15 mL via OROMUCOSAL
  Filled 2023-07-13 (×2): qty 15

## 2023-07-13 MED ORDER — SODIUM CHLORIDE 0.9 % IV SOLN
2.0000 g | INTRAVENOUS | Status: DC
  Administered 2023-07-14 – 2023-07-15 (×2): 2 g via INTRAVENOUS
  Filled 2023-07-13 (×2): qty 20

## 2023-07-13 MED ORDER — MIDAZOLAM HCL 2 MG/2ML IJ SOLN
INTRAMUSCULAR | Status: DC | PRN
  Administered 2023-07-13: 2 mg via INTRAVENOUS

## 2023-07-13 MED ORDER — ADULT MULTIVITAMIN W/MINERALS CH
1.0000 | ORAL_TABLET | Freq: Every day | ORAL | Status: DC
  Administered 2023-07-13 – 2023-07-15 (×3): 1 via ORAL
  Filled 2023-07-13 (×3): qty 1

## 2023-07-13 MED ORDER — ONDANSETRON HCL 4 MG/2ML IJ SOLN
INTRAMUSCULAR | Status: DC | PRN
  Administered 2023-07-13: 4 mg via INTRAVENOUS

## 2023-07-13 MED ORDER — EPHEDRINE 5 MG/ML INJ
INTRAVENOUS | Status: AC
  Filled 2023-07-13: qty 5

## 2023-07-13 MED ORDER — ACETAMINOPHEN 500 MG PO TABS: 1000.0000 mg | ORAL_TABLET | ORAL | Status: DC

## 2023-07-13 MED ORDER — ONDANSETRON HCL 4 MG/2ML IJ SOLN
INTRAMUSCULAR | Status: AC
  Filled 2023-07-13: qty 2

## 2023-07-13 MED ORDER — INSULIN ASPART 100 UNIT/ML IJ SOLN
6.0000 [IU] | Freq: Once | INTRAMUSCULAR | Status: AC
  Administered 2023-07-13: 6 [IU] via SUBCUTANEOUS

## 2023-07-13 MED ORDER — SUGAMMADEX SODIUM 200 MG/2ML IV SOLN
INTRAVENOUS | Status: DC | PRN
  Administered 2023-07-13: 200 mg via INTRAVENOUS

## 2023-07-13 MED ORDER — PROPOFOL 10 MG/ML IV BOLUS
INTRAVENOUS | Status: DC | PRN
  Administered 2023-07-13: 30 mg via INTRAVENOUS
  Administered 2023-07-13: 130 mg via INTRAVENOUS
  Administered 2023-07-13: 40 mg via INTRAVENOUS

## 2023-07-13 MED ORDER — INSULIN GLARGINE-YFGN 100 UNIT/ML ~~LOC~~ SOLN
5.0000 [IU] | Freq: Two times a day (BID) | SUBCUTANEOUS | Status: DC
  Filled 2023-07-13 (×4): qty 0.05

## 2023-07-13 MED ORDER — INSULIN GLARGINE-YFGN 100 UNIT/ML ~~LOC~~ SOLN
10.0000 [IU] | Freq: Two times a day (BID) | SUBCUTANEOUS | Status: DC
Start: 2023-07-13 — End: 2023-07-14
  Administered 2023-07-13 – 2023-07-14 (×3): 10 [IU] via SUBCUTANEOUS
  Filled 2023-07-13 (×4): qty 0.1

## 2023-07-13 MED ORDER — ACETAMINOPHEN 325 MG PO TABS: 650.0000 mg | ORAL_TABLET | Freq: Four times a day (QID) | ORAL | Status: DC | PRN

## 2023-07-13 MED ORDER — FENTANYL CITRATE (PF) 250 MCG/5ML IJ SOLN
INTRAMUSCULAR | Status: DC | PRN
  Administered 2023-07-13 (×5): 50 ug via INTRAVENOUS

## 2023-07-13 MED ORDER — FENTANYL CITRATE (PF) 100 MCG/2ML IJ SOLN
INTRAMUSCULAR | Status: AC
  Filled 2023-07-13: qty 2

## 2023-07-13 MED ORDER — FENTANYL CITRATE (PF) 250 MCG/5ML IJ SOLN
INTRAMUSCULAR | Status: AC
  Filled 2023-07-13: qty 5

## 2023-07-13 MED ORDER — ENALAPRIL MALEATE 2.5 MG PO TABS
5.0000 mg | ORAL_TABLET | Freq: Every day | ORAL | Status: DC
  Administered 2023-07-13 – 2023-07-15 (×3): 5 mg via ORAL
  Filled 2023-07-13 (×4): qty 2

## 2023-07-13 MED ORDER — PHENYLEPHRINE 80 MCG/ML (10ML) SYRINGE FOR IV PUSH (FOR BLOOD PRESSURE SUPPORT)
PREFILLED_SYRINGE | INTRAVENOUS | Status: AC
Start: 2023-07-13 — End: ?
  Filled 2023-07-13: qty 10

## 2023-07-13 MED ORDER — ACETAMINOPHEN 500 MG PO TABS
1000.0000 mg | ORAL_TABLET | Freq: Once | ORAL | Status: AC
Start: 2023-07-13 — End: 2023-07-13
  Administered 2023-07-13: 1000 mg via ORAL
  Filled 2023-07-13: qty 2

## 2023-07-13 MED ORDER — INSULIN ASPART 100 UNIT/ML IJ SOLN
0.0000 [IU] | INTRAMUSCULAR | Status: DC
  Administered 2023-07-13: 5 [IU] via SUBCUTANEOUS
  Administered 2023-07-13 (×2): 3 [IU] via SUBCUTANEOUS
  Administered 2023-07-13 – 2023-07-14 (×2): 5 [IU] via SUBCUTANEOUS
  Administered 2023-07-14: 7 [IU] via SUBCUTANEOUS
  Administered 2023-07-14: 3 [IU] via SUBCUTANEOUS

## 2023-07-13 MED ORDER — BUPIVACAINE-EPINEPHRINE 0.25% -1:200000 IJ SOLN
INTRAMUSCULAR | Status: DC | PRN
  Administered 2023-07-13: 9 mL

## 2023-07-13 MED ORDER — PROCHLORPERAZINE EDISYLATE 10 MG/2ML IJ SOLN
5.0000 mg | Freq: Four times a day (QID) | INTRAMUSCULAR | Status: DC | PRN
  Administered 2023-07-14 – 2023-07-15 (×5): 5 mg via INTRAVENOUS
  Filled 2023-07-13 (×5): qty 2

## 2023-07-13 MED ORDER — SODIUM CHLORIDE 0.9 % IR SOLN
Status: DC | PRN
  Administered 2023-07-13: 1000 mL

## 2023-07-13 MED ORDER — POLYETHYLENE GLYCOL 3350 17 G PO PACK: 17.0000 g | PACK | Freq: Every day | ORAL | Status: DC | PRN

## 2023-07-13 MED ORDER — CEFAZOLIN SODIUM-DEXTROSE 2-3 GM-%(50ML) IV SOLR
INTRAVENOUS | Status: DC | PRN
  Administered 2023-07-13: 2 g via INTRAVENOUS

## 2023-07-13 MED ORDER — DEXAMETHASONE SODIUM PHOSPHATE 10 MG/ML IJ SOLN
INTRAMUSCULAR | Status: AC
Start: 2023-07-13 — End: ?
  Filled 2023-07-13: qty 2

## 2023-07-13 MED ORDER — HYDROMORPHONE HCL 1 MG/ML IJ SOLN
0.5000 mg | INTRAMUSCULAR | Status: DC | PRN
  Administered 2023-07-13 – 2023-07-15 (×7): 0.5 mg via INTRAVENOUS
  Filled 2023-07-13 (×7): qty 0.5

## 2023-07-13 MED ORDER — OXYCODONE HCL 5 MG PO TABS
5.0000 mg | ORAL_TABLET | Freq: Four times a day (QID) | ORAL | Status: DC | PRN
  Administered 2023-07-13 – 2023-07-15 (×4): 5 mg via ORAL
  Filled 2023-07-13 (×4): qty 1

## 2023-07-13 MED ORDER — ROCURONIUM BROMIDE 10 MG/ML (PF) SYRINGE
PREFILLED_SYRINGE | INTRAVENOUS | Status: AC
  Filled 2023-07-13: qty 10

## 2023-07-13 MED ORDER — INSULIN ASPART 100 UNIT/ML IJ SOLN: 0.0000 [IU] | INTRAMUSCULAR | Status: DC | PRN

## 2023-07-13 MED ORDER — DIPHENHYDRAMINE HCL 50 MG/ML IJ SOLN
INTRAMUSCULAR | Status: DC | PRN
  Administered 2023-07-13: 12.5 mg via INTRAVENOUS

## 2023-07-13 MED ORDER — MIDAZOLAM HCL 2 MG/2ML IJ SOLN
INTRAMUSCULAR | Status: AC
  Filled 2023-07-13: qty 2

## 2023-07-13 MED ORDER — BISACODYL 10 MG RE SUPP: 10.0000 mg | Freq: Every day | RECTAL | Status: DC | PRN

## 2023-07-13 SURGICAL SUPPLY — 42 items
ADH SKN CLS APL DERMABOND .7 (GAUZE/BANDAGES/DRESSINGS) ×1
APPLIER CLIP 5 13 M/L LIGAMAX5 (MISCELLANEOUS) ×1
APR CLP MED LRG 5 ANG JAW (MISCELLANEOUS) ×1
BAG COUNTER SPONGE SURGICOUNT (BAG) ×2 IMPLANT
BAG SPEC RTRVL 10 TROC 200 (ENDOMECHANICALS) ×1
BAG SPNG CNTER NS LX DISP (BAG) ×1
BLADE CLIPPER SURG (BLADE) IMPLANT
CANISTER SUCT 3000ML PPV (MISCELLANEOUS) ×2 IMPLANT
CLIP APPLIE 5 13 M/L LIGAMAX5 (MISCELLANEOUS) ×2 IMPLANT
CNTNR URN SCR LID CUP LEK RST (MISCELLANEOUS) IMPLANT
CONT SPEC 4OZ STRL OR WHT (MISCELLANEOUS) ×1
COVER SURGICAL LIGHT HANDLE (MISCELLANEOUS) ×2 IMPLANT
DERMABOND ADVANCED .7 DNX12 (GAUZE/BANDAGES/DRESSINGS) ×2 IMPLANT
ELECT REM PT RETURN 9FT ADLT (ELECTROSURGICAL) ×1
ELECTRODE REM PT RTRN 9FT ADLT (ELECTROSURGICAL) ×2 IMPLANT
ENDOLOOP SUT PDS II 0 18 (SUTURE) IMPLANT
GLOVE BIO SURGEON STRL SZ7 (GLOVE) ×2 IMPLANT
GLOVE BIOGEL PI IND STRL 7.5 (GLOVE) ×2 IMPLANT
GOWN STRL REUS W/ TWL LRG LVL3 (GOWN DISPOSABLE) ×6 IMPLANT
GOWN STRL REUS W/TWL LRG LVL3 (GOWN DISPOSABLE) ×3
GRASPER SUT TROCAR 14GX15 (MISCELLANEOUS) ×2 IMPLANT
HEMOSTAT SNOW SURGICEL 2X4 (HEMOSTASIS) IMPLANT
IRRIG SUCT STRYKERFLOW 2 WTIP (MISCELLANEOUS) ×1
IRRIGATION SUCT STRKRFLW 2 WTP (MISCELLANEOUS) ×2 IMPLANT
KIT BASIN OR (CUSTOM PROCEDURE TRAY) ×2 IMPLANT
KIT TURNOVER KIT B (KITS) ×2 IMPLANT
PAD ARMBOARD 7.5X6 YLW CONV (MISCELLANEOUS) ×2 IMPLANT
POUCH RETRIEVAL ECOSAC 10 (ENDOMECHANICALS) ×2 IMPLANT
SCISSORS LAP 5X35 DISP (ENDOMECHANICALS) ×2 IMPLANT
SET TUBE SMOKE EVAC HIGH FLOW (TUBING) ×2 IMPLANT
SLEEVE Z-THREAD 5X100MM (TROCAR) ×4 IMPLANT
SPECIMEN JAR SMALL (MISCELLANEOUS) ×2 IMPLANT
STRIP CLOSURE SKIN 1/2X4 (GAUZE/BANDAGES/DRESSINGS) ×2 IMPLANT
SUT MNCRL AB 4-0 PS2 18 (SUTURE) ×2 IMPLANT
SUT VICRYL 0 UR6 27IN ABS (SUTURE) ×2 IMPLANT
TAPE STRIPS DRAPE STRL (GAUZE/BANDAGES/DRESSINGS) IMPLANT
TOWEL GREEN STERILE FF (TOWEL DISPOSABLE) ×2 IMPLANT
TRAY LAPAROSCOPIC MC (CUSTOM PROCEDURE TRAY) ×2 IMPLANT
TROCAR BALLN 12MMX100 BLUNT (TROCAR) ×2 IMPLANT
TROCAR Z-THREAD OPTICAL 5X100M (TROCAR) ×2 IMPLANT
WARMER LAPAROSCOPE (MISCELLANEOUS) ×2 IMPLANT
WATER STERILE IRR 1000ML POUR (IV SOLUTION) ×2 IMPLANT

## 2023-07-13 NOTE — H&P (View-Only) (Signed)
Consulting Physician: Hyman Hopes Raelea Gosse  Referring Provider: Dr. Pilar Plate  Chief Complaint: Abdominal pain  Reason for Consult: Cholecystitis   Subjective   HPI: Samuel Valentine is an 52 y.o. male who is here for abdominal pain.  Pain started over the weekend.  Located across the upper abdomen and feels like a crampy blockage.  A little worse on the left than the right.  Accompanied by nausea and vomtiing.  Hasn't eaten much since it started.  Past Medical History:  Diagnosis Date   Chronic hepatitis B (HCC)    followed at [facility]   Hypercholesterolemia    Type 1 diabetes (HCC)    Dr. Fransico Michael    No past surgical history on file.  No family history on file.  Social:  reports that he has never smoked. He has never used smokeless tobacco. He reports current alcohol use. He reports that he does not use drugs.  Allergies:  Allergies  Allergen Reactions   Nirmatrelvir-Ritonavir Diarrhea, Nausea And Vomiting, Nausea Only and Other (See Comments)    Nausea, bradycardia, abdominal pain,    Penicillins     Medications: Current Outpatient Medications  Medication Instructions   albuterol (VENTOLIN HFA) 108 (90 Base) MCG/ACT inhaler Inhale into the lungs.  Inhale 2 puffs into the lungs every 4 hours as needed for Shortness of Breath, Wheezing or Other (Cough).   aspirin 81 mg, Oral, 2 times daily   atorvastatin (LIPITOR) 40 mg, Oral, Daily   Continuous Glucose Receiver (DEXCOM G6 RECEIVER) DEVI Does not apply   Continuous Glucose Sensor (DEXCOM G6 SENSOR) MISC 1 Units, Apply externally, See admin instructions, Change sensor every 10 days   Continuous Glucose Transmitter (DEXCOM G6 TRANSMITTER) MISC 1 Device, Does not apply, Continuous   CONTOUR NEXT TEST test strip USE TO TEST BLOOD GLUCOSE 6 TO 8 TIMES AS DIRECTED   emtricitabine-tenofovir (TRUVADA) 200-300 MG tablet 1 tablet, Oral, Daily, This may be placebo and is study provided. He is also taking cabotegravir/placebo 30mg  daily.    enalapril (VASOTEC) 5 MG tablet TAKE 1 TABLET(5 MG) BY MOUTH TWICE DAILY   insulin aspart (NOVOLOG) 100 UNIT/ML injection ADMINISTER 300 UNITS VIA INSULIN PUMP EVERY 48 HOURS   insulin glargine (LANTUS) 60 Units, Subcutaneous, Daily, Use in case of pump failure   ketoconazole (NIZORAL) 2 % cream 1 Application, 2 times daily   LORazepam (ATIVAN) 0.5 MG tablet 1 tablet, Oral, Daily PRN   Multiple Vitamin (MULTIVITAMIN) tablet 1 tablet, Daily   naproxen (NAPROSYN) 500 MG tablet Take 1 pill po bid for 1-2 wks, then prn pain. Take with food.   NOVOLOG PENFILL cartridge USE IN INSULIN PUMP AS DIRECTED(300 UNITS EVERY 2-3 DAYS)   valACYclovir (VALTREX) 1000 MG tablet 2 pills twice a day at the first sign of cold sore    ROS - all of the below systems have been reviewed with the patient and positives are indicated with bold text General: chills, fever or night sweats Eyes: blurry vision or double vision ENT: epistaxis or sore throat Allergy/Immunology: itchy/watery eyes or nasal congestion Hematologic/Lymphatic: bleeding problems, blood clots or swollen lymph nodes Endocrine: temperature intolerance or unexpected weight changes Breast: new or changing breast lumps or nipple discharge Resp: cough, shortness of breath, or wheezing CV: chest pain or dyspnea on exertion GI: as per HPI GU: dysuria, trouble voiding, or hematuria MSK: joint pain or joint stiffness Neuro: TIA or stroke symptoms Derm: pruritus and skin lesion changes Psych: anxiety and depression  Objective   PE  Blood pressure 131/87, pulse 88, temperature 98.4 F (36.9 C), temperature source Oral, resp. rate 18, SpO2 97%. Constitutional: NAD; conversant; no deformities Eyes: Moist conjunctiva; no lid lag; anicteric; PERRL Neck: Trachea midline; no thyromegaly Lungs: Normal respiratory effort; no tactile fremitus CV: RRR; no palpable thrills; no pitting edema GI: Abd Soft, tender in epigastric area, insulin pump; no palpable  hepatosplenomegaly MSK: Normal range of motion of extremities; no clubbing/cyanosis Psychiatric: Appropriate affect; alert and oriented x3 Lymphatic: No palpable cervical or axillary lymphadenopathy  Results for orders placed or performed during the hospital encounter of 07/12/23 (from the past 24 hour(s))  CBC with Differential     Status: None   Collection Time: 07/12/23  6:39 PM  Result Value Ref Range   WBC 9.3 4.0 - 10.5 K/uL   RBC 4.38 4.22 - 5.81 MIL/uL   Hemoglobin 13.7 13.0 - 17.0 g/dL   HCT 16.1 09.6 - 04.5 %   MCV 92.9 80.0 - 100.0 fL   MCH 31.3 26.0 - 34.0 pg   MCHC 33.7 30.0 - 36.0 g/dL   RDW 40.9 81.1 - 91.4 %   Platelets 213 150 - 400 K/uL   nRBC 0.0 0.0 - 0.2 %   Neutrophils Relative % 70 %   Neutro Abs 6.4 1.7 - 7.7 K/uL   Lymphocytes Relative 22 %   Lymphs Abs 2.1 0.7 - 4.0 K/uL   Monocytes Relative 8 %   Monocytes Absolute 0.7 0.1 - 1.0 K/uL   Eosinophils Relative 0 %   Eosinophils Absolute 0.0 0.0 - 0.5 K/uL   Basophils Relative 0 %   Basophils Absolute 0.0 0.0 - 0.1 K/uL   Immature Granulocytes 0 %   Abs Immature Granulocytes 0.04 0.00 - 0.07 K/uL  Comprehensive metabolic panel     Status: Abnormal   Collection Time: 07/12/23  6:39 PM  Result Value Ref Range   Sodium 135 135 - 145 mmol/L   Potassium 4.0 3.5 - 5.1 mmol/L   Chloride 102 98 - 111 mmol/L   CO2 25 22 - 32 mmol/L   Glucose, Bld 217 (H) 70 - 99 mg/dL   BUN 7 6 - 20 mg/dL   Creatinine, Ser 7.82 0.61 - 1.24 mg/dL   Calcium 9.0 8.9 - 95.6 mg/dL   Total Protein 6.7 6.5 - 8.1 g/dL   Albumin 3.8 3.5 - 5.0 g/dL   AST 18 15 - 41 U/L   ALT 18 0 - 44 U/L   Alkaline Phosphatase 62 38 - 126 U/L   Total Bilirubin 1.0 0.3 - 1.2 mg/dL   GFR, Estimated >21 >30 mL/min   Anion gap 8 5 - 15  Lipase, blood     Status: None   Collection Time: 07/12/23  6:39 PM  Result Value Ref Range   Lipase 23 11 - 51 U/L  Urinalysis, Routine w reflex microscopic -Urine, Clean Catch     Status: Abnormal   Collection  Time: 07/12/23  6:58 PM  Result Value Ref Range   Color, Urine YELLOW YELLOW   APPearance CLEAR CLEAR   Specific Gravity, Urine 1.013 1.005 - 1.030   pH 6.0 5.0 - 8.0   Glucose, UA NEGATIVE NEGATIVE mg/dL   Hgb urine dipstick NEGATIVE NEGATIVE   Bilirubin Urine NEGATIVE NEGATIVE   Ketones, ur 20 (A) NEGATIVE mg/dL   Protein, ur NEGATIVE NEGATIVE mg/dL   Nitrite NEGATIVE NEGATIVE   Leukocytes,Ua NEGATIVE NEGATIVE     Imaging Orders  CT ABDOMEN PELVIS W CONTRAST      Assessment and Plan   Samuel Valentine is an 52 y.o. male with abdominal pain, found on CT to have acute cholecystitis.  I reviewed the CT which showed calcified gallstones, one in the cystic duct, and inflammation around the gallbladder.  I reviewed his lab work which was normal.   I recommended medical admission for management of his type 1 diabetes.  The patient expressed he would like to manage his glucose himself with his pump, but I explained he may not be able to do this the entire hospitalization due to sedation around the time of surgery and pain medication administration.  For the cholecystitis, I recommend laparoscopic cholecystectomy.  We discussed the procedure, its risks, benefits and alternatives.  I explained I would discuss his case with Dr. Dwain Sarna who is covering the general surgery service in the morning and work to add him to the OR schedule.    ICD-10-CM   1. Acute cholecystitis  K81.0        Quentin Ore, MD  [facility] Use [email or web address] to contact on call provider  New Patient Billing: [id number] - Moderate MDM

## 2023-07-13 NOTE — Anesthesia Procedure Notes (Signed)
Procedure Name: Intubation Date/Time: 07/13/2023 4:44 PM  Performed by: Darlina Guys, CRNAPre-anesthesia Checklist: Patient identified, Emergency Drugs available, Suction available and Patient being monitored Patient Re-evaluated:Patient Re-evaluated prior to induction Oxygen Delivery Method: Circle System Utilized Preoxygenation: Pre-oxygenation with 100% oxygen Induction Type: IV induction Ventilation: Oral airway inserted - appropriate to patient size Laryngoscope Size: Mac and 4 Grade View: Grade II Tube type: Oral Number of attempts: 1 Airway Equipment and Method: Stylet and Oral airway Placement Confirmation: ETT inserted through vocal cords under direct vision, positive ETCO2 and breath sounds checked- equal and bilateral Secured at: 23 cm Tube secured with: Tape Dental Injury: Teeth and Oropharynx as per pre-operative assessment  Comments: Atraumatic intubation. Dentition and oral mucosa as per preop

## 2023-07-13 NOTE — Consult Note (Signed)
Consulting Physician: Hyman Hopes Raelea Gosse  Referring Provider: Dr. Pilar Plate  Chief Complaint: Abdominal pain  Reason for Consult: Cholecystitis   Subjective   HPI: Samuel Valentine is an 52 y.o. male who is here for abdominal pain.  Pain started over the weekend.  Located across the upper abdomen and feels like a crampy blockage.  A little worse on the left than the right.  Accompanied by nausea and vomtiing.  Hasn't eaten much since it started.  Past Medical History:  Diagnosis Date   Chronic hepatitis B (HCC)    followed at [facility]   Hypercholesterolemia    Type 1 diabetes (HCC)    Dr. Fransico Michael    No past surgical history on file.  No family history on file.  Social:  reports that he has never smoked. He has never used smokeless tobacco. He reports current alcohol use. He reports that he does not use drugs.  Allergies:  Allergies  Allergen Reactions   Nirmatrelvir-Ritonavir Diarrhea, Nausea And Vomiting, Nausea Only and Other (See Comments)    Nausea, bradycardia, abdominal pain,    Penicillins     Medications: Current Outpatient Medications  Medication Instructions   albuterol (VENTOLIN HFA) 108 (90 Base) MCG/ACT inhaler Inhale into the lungs.  Inhale 2 puffs into the lungs every 4 hours as needed for Shortness of Breath, Wheezing or Other (Cough).   aspirin 81 mg, Oral, 2 times daily   atorvastatin (LIPITOR) 40 mg, Oral, Daily   Continuous Glucose Receiver (DEXCOM G6 RECEIVER) DEVI Does not apply   Continuous Glucose Sensor (DEXCOM G6 SENSOR) MISC 1 Units, Apply externally, See admin instructions, Change sensor every 10 days   Continuous Glucose Transmitter (DEXCOM G6 TRANSMITTER) MISC 1 Device, Does not apply, Continuous   CONTOUR NEXT TEST test strip USE TO TEST BLOOD GLUCOSE 6 TO 8 TIMES AS DIRECTED   emtricitabine-tenofovir (TRUVADA) 200-300 MG tablet 1 tablet, Oral, Daily, This may be placebo and is study provided. He is also taking cabotegravir/placebo 30mg  daily.    enalapril (VASOTEC) 5 MG tablet TAKE 1 TABLET(5 MG) BY MOUTH TWICE DAILY   insulin aspart (NOVOLOG) 100 UNIT/ML injection ADMINISTER 300 UNITS VIA INSULIN PUMP EVERY 48 HOURS   insulin glargine (LANTUS) 60 Units, Subcutaneous, Daily, Use in case of pump failure   ketoconazole (NIZORAL) 2 % cream 1 Application, 2 times daily   LORazepam (ATIVAN) 0.5 MG tablet 1 tablet, Oral, Daily PRN   Multiple Vitamin (MULTIVITAMIN) tablet 1 tablet, Daily   naproxen (NAPROSYN) 500 MG tablet Take 1 pill po bid for 1-2 wks, then prn pain. Take with food.   NOVOLOG PENFILL cartridge USE IN INSULIN PUMP AS DIRECTED(300 UNITS EVERY 2-3 DAYS)   valACYclovir (VALTREX) 1000 MG tablet 2 pills twice a day at the first sign of cold sore    ROS - all of the below systems have been reviewed with the patient and positives are indicated with bold text General: chills, fever or night sweats Eyes: blurry vision or double vision ENT: epistaxis or sore throat Allergy/Immunology: itchy/watery eyes or nasal congestion Hematologic/Lymphatic: bleeding problems, blood clots or swollen lymph nodes Endocrine: temperature intolerance or unexpected weight changes Breast: new or changing breast lumps or nipple discharge Resp: cough, shortness of breath, or wheezing CV: chest pain or dyspnea on exertion GI: as per HPI GU: dysuria, trouble voiding, or hematuria MSK: joint pain or joint stiffness Neuro: TIA or stroke symptoms Derm: pruritus and skin lesion changes Psych: anxiety and depression  Objective   PE  Blood pressure 131/87, pulse 88, temperature 98.4 F (36.9 C), temperature source Oral, resp. rate 18, SpO2 97%. Constitutional: NAD; conversant; no deformities Eyes: Moist conjunctiva; no lid lag; anicteric; PERRL Neck: Trachea midline; no thyromegaly Lungs: Normal respiratory effort; no tactile fremitus CV: RRR; no palpable thrills; no pitting edema GI: Abd Soft, tender in epigastric area, insulin pump; no palpable  hepatosplenomegaly MSK: Normal range of motion of extremities; no clubbing/cyanosis Psychiatric: Appropriate affect; alert and oriented x3 Lymphatic: No palpable cervical or axillary lymphadenopathy  Results for orders placed or performed during the hospital encounter of 07/12/23 (from the past 24 hour(s))  CBC with Differential     Status: None   Collection Time: 07/12/23  6:39 PM  Result Value Ref Range   WBC 9.3 4.0 - 10.5 K/uL   RBC 4.38 4.22 - 5.81 MIL/uL   Hemoglobin 13.7 13.0 - 17.0 g/dL   HCT 16.1 09.6 - 04.5 %   MCV 92.9 80.0 - 100.0 fL   MCH 31.3 26.0 - 34.0 pg   MCHC 33.7 30.0 - 36.0 g/dL   RDW 40.9 81.1 - 91.4 %   Platelets 213 150 - 400 K/uL   nRBC 0.0 0.0 - 0.2 %   Neutrophils Relative % 70 %   Neutro Abs 6.4 1.7 - 7.7 K/uL   Lymphocytes Relative 22 %   Lymphs Abs 2.1 0.7 - 4.0 K/uL   Monocytes Relative 8 %   Monocytes Absolute 0.7 0.1 - 1.0 K/uL   Eosinophils Relative 0 %   Eosinophils Absolute 0.0 0.0 - 0.5 K/uL   Basophils Relative 0 %   Basophils Absolute 0.0 0.0 - 0.1 K/uL   Immature Granulocytes 0 %   Abs Immature Granulocytes 0.04 0.00 - 0.07 K/uL  Comprehensive metabolic panel     Status: Abnormal   Collection Time: 07/12/23  6:39 PM  Result Value Ref Range   Sodium 135 135 - 145 mmol/L   Potassium 4.0 3.5 - 5.1 mmol/L   Chloride 102 98 - 111 mmol/L   CO2 25 22 - 32 mmol/L   Glucose, Bld 217 (H) 70 - 99 mg/dL   BUN 7 6 - 20 mg/dL   Creatinine, Ser 7.82 0.61 - 1.24 mg/dL   Calcium 9.0 8.9 - 95.6 mg/dL   Total Protein 6.7 6.5 - 8.1 g/dL   Albumin 3.8 3.5 - 5.0 g/dL   AST 18 15 - 41 U/L   ALT 18 0 - 44 U/L   Alkaline Phosphatase 62 38 - 126 U/L   Total Bilirubin 1.0 0.3 - 1.2 mg/dL   GFR, Estimated >21 >30 mL/min   Anion gap 8 5 - 15  Lipase, blood     Status: None   Collection Time: 07/12/23  6:39 PM  Result Value Ref Range   Lipase 23 11 - 51 U/L  Urinalysis, Routine w reflex microscopic -Urine, Clean Catch     Status: Abnormal   Collection  Time: 07/12/23  6:58 PM  Result Value Ref Range   Color, Urine YELLOW YELLOW   APPearance CLEAR CLEAR   Specific Gravity, Urine 1.013 1.005 - 1.030   pH 6.0 5.0 - 8.0   Glucose, UA NEGATIVE NEGATIVE mg/dL   Hgb urine dipstick NEGATIVE NEGATIVE   Bilirubin Urine NEGATIVE NEGATIVE   Ketones, ur 20 (A) NEGATIVE mg/dL   Protein, ur NEGATIVE NEGATIVE mg/dL   Nitrite NEGATIVE NEGATIVE   Leukocytes,Ua NEGATIVE NEGATIVE     Imaging Orders  CT ABDOMEN PELVIS W CONTRAST      Assessment and Plan   Samih Witherell is an 52 y.o. male with abdominal pain, found on CT to have acute cholecystitis.  I reviewed the CT which showed calcified gallstones, one in the cystic duct, and inflammation around the gallbladder.  I reviewed his lab work which was normal.   I recommended medical admission for management of his type 1 diabetes.  The patient expressed he would like to manage his glucose himself with his pump, but I explained he may not be able to do this the entire hospitalization due to sedation around the time of surgery and pain medication administration.  For the cholecystitis, I recommend laparoscopic cholecystectomy.  We discussed the procedure, its risks, benefits and alternatives.  I explained I would discuss his case with Dr. Dwain Sarna who is covering the general surgery service in the morning and work to add him to the OR schedule.    ICD-10-CM   1. Acute cholecystitis  K81.0        Quentin Ore, MD  [facility] Use [email or web address] to contact on call provider  New Patient Billing: [id number] - Moderate MDM

## 2023-07-13 NOTE — Op Note (Signed)
Preoperative diagnosis: Acute cholecystitis Postoperative diagnosis: Gangrenous cholecystitis Procedure: Laparoscopic cholecystectomy Surgeon: Dr. Harden Mo Anesthesia: General Complications: None Estimated blood loss: 30 cc Specimens: gallbladder and stones to pathology Sponge needle count was correct completion Disposition recovery stable condition   Indications:  52 yom with several days of abdominal pain. Tender ruq and has [place] that shows what appears to be calculous cholecystitis. We discussed proceeding with laparoscopic cholecystectomy.    Procedure: After informed consent was obtained he was taken to the operating room.  He was given antibiotics.  SCDs were in place.  He was placed under general anesthesia without complication.  He was prepped and draped in a standard sterile surgical fashion.  A surgical timeout was then performed.   I made a vertical incision below the umbilicus.  I grasped the fascia with Kocher clamps.  I made an incisoin and entered the peritoneum bluntly.    I placed a 0 Vicryl pursestring suture.  I insufflated the abdomen to 15 mmHg pressure.  I then inserted 3 additional 5 mm trocars in the epigastrium and right upper quadrant.  This was done without injury.  The gallbladder was noted to have a lot of adhesions to it.  I took down adhesions from the omentum to the liver.  The gallbladder was tense and I had to aspirate it just to be able to grasp it. He had gangrenous cholecystitis.  Eventually was retracted cephalad.  I took down a lot of adhesions laterally from the omentum.  Once I had done this I dissected the triangle. I clipped the artery three times and divided it leaving two in place.   I clearly obtained a critical view of safety.  I then took the gallbladder completely off the cystic plate and just had it attached at the cystic duct.  I elected to place an Endoloop proximally.  I then divided the gallbladder and placed in a retrieval bag.  I then removed  this from the abdomen.  Hemostasis was obtained.  I did place a piece of snow.    I then desufflated the abdomen and remove the remaining trocars.  I tied my pursestring down.  I had placed another 0 Vicryl using the suture passer to completely obliterate the umbilical defect.  These were then closed with 4-0 Monocryl and glue.  He tolerated this well was extubated and transferred recovery stable.

## 2023-07-13 NOTE — ED Notes (Signed)
ED TO INPATIENT HANDOFF REPORT  ED Nurse Name and Phone #: [phone]  S Name/Age/Gender Samuel Valentine 52 y.o. male Room/Bed: 001C/001C  Code Status   Code Status: Full Code  Home/SNF/Other Home  Patient oriented to: self, place, time, and situation Is this baseline? Yes   Triage Complete: Triage complete  Chief Complaint Acute cholecystitis [K81.0]  Triage Note Patient with abdominal cramping on L>R since Sunday. Vomiting Sunday night into Monday but no vomiting today. Tolerating mild foods today but pain still present.    Allergies Allergies  Allergen Reactions   Nirmatrelvir-Ritonavir Diarrhea, Nausea And Vomiting, Nausea Only and Other (See Comments)    Nausea, bradycardia, abdominal pain,    Penicillins     Level of Care/Admitting Diagnosis ED Disposition     ED Disposition  Admit   Condition  --   Comment  Hospital Area: [facility] [[id number]]  Level of Care: Med-Surg [16]  May admit patient to [facility] or [facility] if equivalent level of care is available:: Yes  Covid Evaluation: Asymptomatic - no recent exposure (last 10 days) testing not required  Diagnosis: Acute cholecystitis [575.0.ICD-9-CM]  Admitting Physician: Darlin Drop [[id number]]  Attending Physician: Darlin Drop [[id number]]  Certification:: I certify this patient will need inpatient services for at least 2 midnights  Expected Medical Readiness: 07/15/2023          B Medical/Surgery History Past Medical History:  Diagnosis Date   Chronic hepatitis B (HCC)    followed at [facility]   Hypercholesterolemia    Type 1 diabetes (HCC)    Dr. Fransico Michael   No past surgical history on file.   A IV Location/Drains/Wounds Patient Lines/Drains/Airways Status     Active Line/Drains/Airways     Name Placement date Placement time Site Days   Peripheral IV 07/12/23 22 G 1" Right Antecubital 07/12/23  2021  Antecubital  1            Intake/Output Last 24 hours No intake  or output data in the 24 hours ending 07/13/23 0307  Labs/Imaging Results for orders placed or performed during the hospital encounter of 07/12/23 (from the past 48 hour(s))  CBC with Differential     Status: None   Collection Time: 07/12/23  6:39 PM  Result Value Ref Range   WBC 9.3 4.0 - 10.5 K/uL   RBC 4.38 4.22 - 5.81 MIL/uL   Hemoglobin 13.7 13.0 - 17.0 g/dL   HCT 95.2 84.1 - 32.4 %   MCV 92.9 80.0 - 100.0 fL   MCH 31.3 26.0 - 34.0 pg   MCHC 33.7 30.0 - 36.0 g/dL   RDW 40.1 02.7 - 25.3 %   Platelets 213 150 - 400 K/uL   nRBC 0.0 0.0 - 0.2 %   Neutrophils Relative % 70 %   Neutro Abs 6.4 1.7 - 7.7 K/uL   Lymphocytes Relative 22 %   Lymphs Abs 2.1 0.7 - 4.0 K/uL   Monocytes Relative 8 %   Monocytes Absolute 0.7 0.1 - 1.0 K/uL   Eosinophils Relative 0 %   Eosinophils Absolute 0.0 0.0 - 0.5 K/uL   Basophils Relative 0 %   Basophils Absolute 0.0 0.0 - 0.1 K/uL   Immature Granulocytes 0 %   Abs Immature Granulocytes 0.04 0.00 - 0.07 K/uL    Comment: Performed at [facility], [address] [place], [place] [address]  Comprehensive metabolic panel     Status: Abnormal   Collection Time: 07/12/23  6:39 PM  Result Value Ref Range   Sodium 135 135 - 145 mmol/L   Potassium 4.0 3.5 - 5.1 mmol/L   Chloride 102 98 - 111 mmol/L   CO2 25 22 - 32 mmol/L   Glucose, Bld 217 (H) 70 - 99 mg/dL    Comment: Glucose reference range applies only to samples taken after fasting for at least 8 hours.   BUN 7 6 - 20 mg/dL   Creatinine, Ser 1.61 0.61 - 1.24 mg/dL   Calcium 9.0 8.9 - 09.6 mg/dL   Total Protein 6.7 6.5 - 8.1 g/dL   Albumin 3.8 3.5 - 5.0 g/dL   AST 18 15 - 41 U/L   ALT 18 0 - 44 U/L   Alkaline Phosphatase 62 38 - 126 U/L   Total Bilirubin 1.0 0.3 - 1.2 mg/dL   GFR, Estimated >04 >54 mL/min    Comment: (NOTE) Calculated using the CKD-EPI Creatinine Equation (2021)    Anion gap 8 5 - 15    Comment: Performed at [facility], [address], [place], [place]  [address]  Lipase, blood     Status: None   Collection Time: 07/12/23  6:39 PM  Result Value Ref Range   Lipase 23 11 - 51 U/L    Comment: Performed at [facility], [address] [place], [place] [address]  Urinalysis, Routine w reflex microscopic -Urine, Clean Catch     Status: Abnormal   Collection Time: 07/12/23  6:58 PM  Result Value Ref Range   Color, Urine YELLOW YELLOW   APPearance CLEAR CLEAR   Specific Gravity, Urine 1.013 1.005 - 1.030   pH 6.0 5.0 - 8.0   Glucose, UA NEGATIVE NEGATIVE mg/dL   Hgb urine dipstick NEGATIVE NEGATIVE   Bilirubin Urine NEGATIVE NEGATIVE   Ketones, ur 20 (A) NEGATIVE mg/dL   Protein, ur NEGATIVE NEGATIVE mg/dL   Nitrite NEGATIVE NEGATIVE   Leukocytes,Ua NEGATIVE NEGATIVE    Comment: Performed at [facility], [address], [place], [place] [address]   CT ABDOMEN PELVIS W CONTRAST  Result Date: 07/12/2023 CLINICAL DATA:  Acute nonlocalized abdominal pain. EXAM: CT ABDOMEN AND PELVIS WITH CONTRAST TECHNIQUE: Multidetector CT imaging of the abdomen and pelvis was performed using the standard protocol following bolus administration of intravenous contrast. RADIATION DOSE REDUCTION: This exam was performed according to the departmental dose-optimization program which includes automated exposure control, adjustment of the mA and/or kV according to patient size and/or use of iterative reconstruction technique. CONTRAST:  75mL OMNIPAQUE IOHEXOL 350 MG/ML SOLN COMPARISON:  Ultrasound abdomen 09/04/2008 FINDINGS: Lower chest: Nodule in the right costophrenic angle measuring 7 mm diameter. Hepatobiliary: Cholelithiasis with gallbladder wall thickening and pericholecystic stranding. 3 mm stone demonstrated in the distal cystic duct. No bile duct dilatation. Pancreas: Unremarkable. No pancreatic ductal dilatation or surrounding inflammatory changes. Spleen: Normal in size without focal abnormality. Adrenals/Urinary Tract: Adrenal glands are unremarkable.  Kidneys are normal, without renal calculi, focal lesion, or hydronephrosis. Bladder is unremarkable. Stomach/Bowel: Stomach is within normal limits. Appendix appears normal. No evidence of bowel wall thickening, distention, or inflammatory changes. Vascular/Lymphatic: No significant vascular findings are present. No enlarged abdominal or pelvic lymph nodes. Reproductive: Prostate is unremarkable. Other: No abdominal wall hernia or abnormality. No abdominopelvic ascites. Musculoskeletal: No acute or significant osseous findings. IMPRESSION: 1. Cholelithiasis with inflammatory changes around the gallbladder likely indicating acute cholecystitis. 3 mm stone likely in the origin of the cystic duct. No bile duct  dilatation. 2. Right solid pulmonary nodule measuring 7 mm. Per Fleischner Society Guidelines, recommend a non-contrast Chest CT at 6-12 months. If patient is high risk for malignancy, consider an additional non-contrast Chest CT at 18-24 months. If patient is low risk for malignancy, non-contrast Chest CT at 18-24 months is optional. These guidelines do not apply to immunocompromised patients and patients with cancer. Follow up in patients with significant comorbidities as clinically warranted. For lung cancer screening, adhere to Lung-RADS guidelines. Reference: Radiology. 2017; 284(1):228-43. Electronically Signed   By: Burman Nieves M.D.   On: 07/12/2023 23:26    Pending Labs Unresulted Labs (From admission, onward)     Start     Ordered   07/13/23 0500  CBC  Tomorrow morning,   R        07/13/23 0237   07/13/23 0500  Comprehensive metabolic panel  Tomorrow morning,   R        07/13/23 0237   07/13/23 0500  Magnesium  Tomorrow morning,   R        07/13/23 0237   07/13/23 0500  Phosphorus  Tomorrow morning,   R        07/13/23 0237   07/13/23 0222  HIV Antibody (routine testing w rflx)  (HIV Antibody (Routine testing w reflex) panel)  Once,   R        07/13/23 0222             Vitals/Pain Today's Vitals   07/12/23 1817 07/12/23 1829 07/13/23 0221 07/13/23 0245  BP: 131/87   130/73  Pulse: 88   63  Resp: 18     Temp: 98.4 F (36.9 C)  99.1 F (37.3 C)   TempSrc: Oral  Oral   SpO2: 97%   94%  PainSc:  4       Isolation Precautions No active isolations  Medications Medications  cefTRIAXone (ROCEPHIN) 2 g in sodium chloride 0.9 % 100 mL IVPB (has no administration in time range)  metroNIDAZOLE (FLAGYL) IVPB 500 mg (has no administration in time range)  acetaminophen (TYLENOL) tablet 650 mg (has no administration in time range)  prochlorperazine (COMPAZINE) injection 5 mg (has no administration in time range)  oxyCODONE (Oxy IR/ROXICODONE) immediate release tablet 5 mg (has no administration in time range)  HYDROmorphone (DILAUDID) injection 0.5 mg (has no administration in time range)  polyethylene glycol (MIRALAX / GLYCOLAX) packet 17 g (has no administration in time range)  melatonin tablet 5 mg (has no administration in time range)  0.9 %  sodium chloride infusion (has no administration in time range)  insulin glargine-yfgn (SEMGLEE) injection 5 Units (has no administration in time range)  insulin aspart (novoLOG) injection 0-9 Units (has no administration in time range)  HYDROmorphone (DILAUDID) injection 0.5 mg (has no administration in time range)  bisacodyl (DULCOLAX) suppository 10 mg (has no administration in time range)  enalapril (VASOTEC) tablet 5 mg (has no administration in time range)  multivitamin tablet 1 tablet (has no administration in time range)  ondansetron (ZOFRAN-ODT) disintegrating tablet 4 mg (4 mg Oral Given 07/12/23 1835)  ondansetron (ZOFRAN-ODT) disintegrating tablet 4 mg (4 mg Oral Given 07/12/23 2012)  iohexol (OMNIPAQUE) 350 MG/ML injection 75 mL (75 mLs Intravenous Contrast Given 07/12/23 2128)  HYDROmorphone (DILAUDID) injection 1 mg (1 mg Intravenous Given 07/12/23 2353)  ondansetron (ZOFRAN) injection 4 mg (4 mg  Intravenous Given 07/12/23 2352)  sodium chloride 0.9 % bolus 1,000 mL (1,000 mLs Intravenous New Bag/Given 07/13/23 0008)  cefTRIAXone (ROCEPHIN) 2 g in sodium chloride 0.9 % 100 mL IVPB (0 g Intravenous Stopped 07/13/23 0047)  metroNIDAZOLE (FLAGYL) IVPB 500 mg (0 mg Intravenous Stopped 07/13/23 0219)    Mobility walks     Focused Assessments     R Recommendations: See Admitting Provider Note  Report given to:   Additional Notes: Pt A/Ox4, dilaudid given before taking up.

## 2023-07-13 NOTE — Progress Notes (Signed)
Patient seen and examined this morning, admitted overnight.  H&P reviewed and agree with assessment and plan.  52 year old male with history of type 1 diabetes mellitus, HTN, HLD comes into the hospital with upper quadrant abdominal pain, diagnosed with acute cholecystitis, he was admitted to the hospital and general surgery was consulted.  Looks like general surgery may take him to the OR this afternoon  Keep n.p.o. for now, he is off his insulin pump.  Settings are as follows, 12 midnight to 3 AM 0.9 to 5 units/h, 3 AM to 8 AM 1.45 units/h, 8 AM to 12 noon 1.2 units/h, 12 noon to 7 PM 0.9 units/h and at 7 PM to midnight he is at 1.2 units/h.  Carb ratio is 129 and correction is 1-30 with goal 120  Since pump has been discontinued, will place on 10 units glargine twice daily, check glucose before.  Pending on timing of surgery and when he may eat again versus additional interventions, will reassess this afternoon   Aliviana Burdell M. Elvera Lennox, MD, PhD [facility]  Between 7 am - 7 pm you can contact me via Amion (for emergencies) or Securechat (non urgent matters).  I am not available 7 pm - 7 am, please contact night coverage MD/APP via Amion

## 2023-07-13 NOTE — H&P (Signed)
History and Physical  Samuel Valentine UUV:[id number] DOB: 1971/05/03 DOA: 07/12/2023  Referring physician: Dr, Pilar Plate, EDP  PCP: Ronnald Nian, MD  Outpatient Specialists: Endocrinology. Patient coming from: Home  Chief Complaint: Abdominal pain, nausea and vomiting  HPI: Samuel Valentine is a 52 y.o. male with medical history significant for type 1 diabetes on insulin pump, hyperlipidemia, hypertension, who presents to the ED from home due to severe upper abdominal pain.  Onset of symptoms 3 days ago, worse after eating dinner last night.  Associated with nausea and vomiting.  No reported subjective fevers or chills.  In the ED, contrast-enhanced CT abdomen pelvis revealed the following: 1. Cholelithiasis with inflammatory changes around the gallbladder likely indicating acute cholecystitis. 3 mm stone likely in the origin of the cystic duct. No bile duct dilatation. 2. Right solid pulmonary nodule measuring 7 mm. Per Fleischner Society Guidelines, recommend a non-contrast Chest CT at 6-12 months. If patient is high risk for malignancy, consider an additional non-contrast Chest CT at 18-24 months. If patient is low risk for malignancy, non-contrast Chest CT at 18-24 months is optional.  The patient was started on empiric IV antibiotics in the ED, Rocephin and IV Flagyl.  EDP discussed the case with general surgery.  Plan for lap chole on 07/13/2023.  Admitted by [facility], hospitalist service.   ED Course: Temperature 99.1.  BP 130/73, pulse 63, respiration rate 18, O2 saturation 94% on room air.  Lab studies notable for serum glucose 217.  CBC essentially unremarkable.  Review of Systems: Review of systems as noted in the HPI. All other systems reviewed and are negative.   Past Medical History:  Diagnosis Date   Chronic hepatitis B (HCC)    followed at [facility]   Hypercholesterolemia    Type 1 diabetes (HCC)    Dr. Fransico Michael   No past surgical history on file.  Social History:  reports  that he has never smoked. He has never used smokeless tobacco. He reports current alcohol use. He reports that he does not use drugs.   Allergies  Allergen Reactions   Nirmatrelvir-Ritonavir Diarrhea, Nausea And Vomiting, Nausea Only and Other (See Comments)    Nausea, bradycardia, abdominal pain,    Penicillins     Family history: None reported.  Prior to Admission medications   Medication Sig Start Date End Date Taking? Authorizing Provider  albuterol (VENTOLIN HFA) 108 (90 Base) MCG/ACT inhaler Inhale into the lungs.  Inhale 2 puffs into the lungs every 4 hours as needed for Shortness of Breath, Wheezing or Other (Cough). Patient not taking: Reported on 06/23/2023 12/26/20   [provider]  aspirin 81 MG chewable tablet Chew 81 mg by mouth 2 (two) times daily.     [provider]  atorvastatin (LIPITOR) 40 MG tablet Take 1 tablet (40 mg total) by mouth daily. 06/15/23 09/13/23  Altamese Nikiski, MD  Continuous Glucose Receiver (DEXCOM G6 RECEIVER) DEVI by Does not apply route. 10/08/20   [provider]  Continuous Glucose Sensor (DEXCOM G6 SENSOR) MISC Apply 1 Units topically See admin instructions. Change sensor every 10 days 06/15/23   Altamese [place], MD  Continuous Glucose Transmitter (DEXCOM G6 TRANSMITTER) MISC 1 Device by Does not apply route continuous. 03/10/23   Altamese [place], MD  CONTOUR NEXT TEST test strip USE TO TEST BLOOD GLUCOSE 6 TO 8 TIMES AS DIRECTED Patient not taking: Reported on 06/23/2023 09/11/18   David Stall, MD  emtricitabine-tenofovir (TRUVADA) 200-300 MG tablet Take 1 tablet by mouth  daily. This may be placebo and is study provided. He is also taking cabotegravir/placebo 30mg  daily. 06/29/23   Ronnald Nian, MD  enalapril (VASOTEC) 5 MG tablet TAKE 1 TABLET(5 MG) BY MOUTH TWICE DAILY 09/11/18   David Stall, MD  insulin aspart (NOVOLOG) 100 UNIT/ML injection ADMINISTER 300 UNITS VIA INSULIN PUMP EVERY 48 HOURS 06/15/23    Motwani, Komal, MD  insulin glargine (LANTUS) 100 UNIT/ML Solostar Pen Inject 60 Units into the skin daily. Use in case of pump failure 02/24/23   Motwani, Carin Hock, MD  ketoconazole (NIZORAL) 2 % cream Apply 1 Application topically 2 (two) times daily. Patient not taking: Reported on 06/23/2023    [provider]  LORazepam (ATIVAN) 0.5 MG tablet Take 1 tablet by mouth daily as needed. 01/26/22   [provider]  Multiple Vitamin (MULTIVITAMIN) tablet Take 1 tablet by mouth daily.      [provider]  naproxen (NAPROSYN) 500 MG tablet Take 1 pill po bid for 1-2 wks, then prn pain. Take with food. Patient not taking: Reported on 06/23/2023 03/05/22   [provider]  NOVOLOG PENFILL cartridge USE IN INSULIN PUMP AS DIRECTED(300 UNITS EVERY 2-3 DAYS) 09/14/16   David Stall, MD  valACYclovir (VALTREX) 1000 MG tablet 2 pills twice a day at the first sign of cold sore Patient not taking: Reported on 06/23/2023 11/22/12   Ronnald Nian, MD    Physical Exam: BP 131/87 (BP Location: Right Arm)   Pulse 88   Temp 99.1 F (37.3 C) (Oral)   Resp 18   SpO2 97%   General: 52 y.o. year-old male well developed well nourished in no acute distress.  Alert and oriented x3. Cardiovascular: Regular rate and rhythm with no rubs or gallops.  No thyromegaly or JVD noted.  No lower extremity edema. 2/4 pulses in all 4 extremities. Respiratory: Clear to auscultation with no wheezes or rales. Good inspiratory effort. Abdomen: Soft left and right upper quadrant tenderness.  Nondistended with normal bowel sounds x4 quadrants. Muskuloskeletal: No cyanosis, clubbing or edema noted bilaterally Neuro: CN II-XII intact, strength, sensation, reflexes Skin: No ulcerative lesions noted or rashes Psychiatry: Judgement and insight appear normal. Mood is appropriate for condition and setting          Labs on Admission:  Basic Metabolic Panel: Recent Labs  Lab 07/12/23 1839  NA 135  K  4.0  CL 102  CO2 25  GLUCOSE 217*  BUN 7  CREATININE 1.14  CALCIUM 9.0   Liver Function Tests: Recent Labs  Lab 07/12/23 1839  AST 18  ALT 18  ALKPHOS 62  BILITOT 1.0  PROT 6.7  ALBUMIN 3.8   Recent Labs  Lab 07/12/23 1839  LIPASE 23   No results for input(s): "AMMONIA" in the last 168 hours. CBC: Recent Labs  Lab 07/12/23 1839  WBC 9.3  NEUTROABS 6.4  HGB 13.7  HCT 40.7  MCV 92.9  PLT 213   Cardiac Enzymes: No results for input(s): "CKTOTAL", "CKMB", "CKMBINDEX", "TROPONINI" in the last 168 hours.  BNP (last 3 results) No results for input(s): "BNP" in the last 8760 hours.  ProBNP (last 3 results) No results for input(s): "PROBNP" in the last 8760 hours.  CBG: No results for input(s): "GLUCAP" in the last 168 hours.  Radiological Exams on Admission: CT ABDOMEN PELVIS W CONTRAST  Result Date: 07/12/2023 CLINICAL DATA:  Acute nonlocalized abdominal pain. EXAM: CT ABDOMEN AND PELVIS WITH CONTRAST TECHNIQUE: Multidetector CT imaging of  the abdomen and pelvis was performed using the standard protocol following bolus administration of intravenous contrast. RADIATION DOSE REDUCTION: This exam was performed according to the departmental dose-optimization program which includes automated exposure control, adjustment of the mA and/or kV according to patient size and/or use of iterative reconstruction technique. CONTRAST:  75mL OMNIPAQUE IOHEXOL 350 MG/ML SOLN COMPARISON:  Ultrasound abdomen 09/04/2008 FINDINGS: Lower chest: Nodule in the right costophrenic angle measuring 7 mm diameter. Hepatobiliary: Cholelithiasis with gallbladder wall thickening and pericholecystic stranding. 3 mm stone demonstrated in the distal cystic duct. No bile duct dilatation. Pancreas: Unremarkable. No pancreatic ductal dilatation or surrounding inflammatory changes. Spleen: Normal in size without focal abnormality. Adrenals/Urinary Tract: Adrenal glands are unremarkable. Kidneys are normal,  without renal calculi, focal lesion, or hydronephrosis. Bladder is unremarkable. Stomach/Bowel: Stomach is within normal limits. Appendix appears normal. No evidence of bowel wall thickening, distention, or inflammatory changes. Vascular/Lymphatic: No significant vascular findings are present. No enlarged abdominal or pelvic lymph nodes. Reproductive: Prostate is unremarkable. Other: No abdominal wall hernia or abnormality. No abdominopelvic ascites. Musculoskeletal: No acute or significant osseous findings. IMPRESSION: 1. Cholelithiasis with inflammatory changes around the gallbladder likely indicating acute cholecystitis. 3 mm stone likely in the origin of the cystic duct. No bile duct dilatation. 2. Right solid pulmonary nodule measuring 7 mm. Per Fleischner Society Guidelines, recommend a non-contrast Chest CT at 6-12 months. If patient is high risk for malignancy, consider an additional non-contrast Chest CT at 18-24 months. If patient is low risk for malignancy, non-contrast Chest CT at 18-24 months is optional. These guidelines do not apply to immunocompromised patients and patients with cancer. Follow up in patients with significant comorbidities as clinically warranted. For lung cancer screening, adhere to Lung-RADS guidelines. Reference: Radiology. 2017; 284(1):228-43. Electronically Signed   By: Burman Nieves M.D.   On: 07/12/2023 23:26    EKG: I independently viewed the EKG done and my findings are as followed: None available at the time of this visit.  Assessment/Plan Present on Admission:  Acute cholecystitis  Principal Problem:   Acute cholecystitis  Acute cholecystitis, seen on CT scan with calcified gallstones Seen by general surgery Plan for laparoscopic cholecystectomy on 07/13/2023 NPO. As needed analgesics  Type 2 diabetes with hyperglycemia Presented with insulin pump Hold off insulin pump Start Semglee 5 units twice daily and insulin sliding scale every 4 hours while  NPO. Monitor CBG  Essential hypertension BPs are currently stable Resume home enalapril  Hyperlipidemia Resume home statin postsurgery   Time: 75 minutes.   DVT prophylaxis: SCDs.  Defer pharmacological DVT prophylaxis to general surgery, since planned procedure today..  Code Status: Full code.  Family Communication: None at bedside.  Disposition Plan: Admitted to MedSurg unit  Consults called: General Surgery.  Admission status: Inpatient status.   Status is: Inpatient The patient requires at least 2 midnights for further evaluation and treatment of present condition.   Darlin Drop MD [facility] Pager [phone]  If 7PM-7AM, please contact night-coverage [email or web address] Password [facility]  07/13/2023, 2:43 AM

## 2023-07-13 NOTE — Interval H&P Note (Signed)
History and Physical Interval Note:  07/13/2023 7:52 AM I have seen and examined patient. Has cholecystitis on imaging and exam. Discussed with Dr Dossie Der.  Plan for hopeful lap chole today time permitting I discussed the procedure in detail.  We discussed the risks and benefits of a laparoscopic cholecystectomy and possible cholangiogram including, but not limited to bleeding, infection, injury to surrounding structures such as the intestine or liver, bile leak, retained gallstones, need to convert to an open procedure, prolonged diarrhea, blood clots such as  DVT, common bile duct injury, anesthesia risks, and possible need for additional procedures.  The likelihood of improvement in symptoms and return to the patient's normal status is good. We discussed the typical post-operative recovery course.  Mrk Rohrbaugh  has presented today for surgery, with the diagnosis of acute cholecystitis.  The various methods of treatment have been discussed with the patient and family. After consideration of risks, benefits and other options for treatment, the patient has consented to  Procedure(s): LAPAROSCOPIC CHOLECYSTECTOMY (N/A) as a surgical intervention.  The patient's history has been reviewed, patient examined, no change in status, stable for surgery.  I have reviewed the patient's chart and labs.  Questions were answered to the patient's satisfaction.     Emelia Loron

## 2023-07-13 NOTE — Transfer of Care (Signed)
Immediate Anesthesia Transfer of Care Note  Patient: Samuel Valentine  Procedure(s) Performed: LAPAROSCOPIC CHOLECYSTECTOMY  Patient Location: PACU  Anesthesia Type:General  Level of Consciousness: awake and oriented  Airway & Oxygen Therapy: Patient Spontanous Breathing  Post-op Assessment: Report given to RN and Post -op Vital signs reviewed and stable  Post vital signs: Reviewed and stable  Last Vitals:  Vitals Value Taken Time  BP 151/81 07/13/23 1800  Temp    Pulse 77 07/13/23 1801  Resp 18 07/13/23 1801  SpO2 95 % 07/13/23 1801  Vitals shown include unfiled device data.  Last Pain:  Vitals:   07/13/23 1533  TempSrc: Oral  PainSc: 8       Patients Stated Pain Goal: 3 (07/13/23 1533)  Complications: No notable events documented.

## 2023-07-13 NOTE — Anesthesia Preprocedure Evaluation (Addendum)
Anesthesia Evaluation  Patient identified by MRN, date of birth, ID band Patient awake    Reviewed: Allergy & Precautions, NPO status , Patient's Chart, lab work & pertinent test results  Airway Mallampati: II  TM Distance: >3 FB Neck ROM: Full    Dental   Pulmonary neg pulmonary ROS   breath sounds clear to auscultation       Cardiovascular hypertension, Pt. on medications  Rhythm:Regular Rate:Normal     Neuro/Psych negative neurological ROS  negative psych ROS   GI/Hepatic negative GI ROS,,,(+) Hepatitis -, B  Endo/Other  diabetes, Type 1, Insulin Dependent    Renal/GU negative Renal ROS  negative genitourinary   Musculoskeletal negative musculoskeletal ROS (+)    Abdominal   Peds  Hematology negative hematology ROS (+)   Anesthesia Other Findings   Reproductive/Obstetrics                             Anesthesia Physical Anesthesia Plan  ASA: 3  Anesthesia Plan: General   Post-op Pain Management: Tylenol PO (pre-op)*   Induction: Intravenous  PONV Risk Score and Plan: 2 and Midazolam, Dexamethasone and Ondansetron  Airway Management Planned: Oral ETT  Additional Equipment:   Intra-op Plan:   Post-operative Plan: Extubation in OR  Informed Consent: I have reviewed the patients History and Physical, chart, labs and discussed the procedure including the risks, benefits and alternatives for the proposed anesthesia with the patient or authorized representative who has indicated his/her understanding and acceptance.     Dental advisory given  Plan Discussed with: CRNA  Anesthesia Plan Comments:        Anesthesia Quick Evaluation

## 2023-07-14 ENCOUNTER — Encounter (HOSPITAL_COMMUNITY): Payer: Self-pay | Admitting: General Surgery

## 2023-07-14 DIAGNOSIS — K810 Acute cholecystitis: Secondary | ICD-10-CM | POA: Diagnosis not present

## 2023-07-14 LAB — GLUCOSE, CAPILLARY
Glucose-Capillary: 242 mg/dL — ABNORMAL HIGH (ref 70–99)
Glucose-Capillary: 258 mg/dL — ABNORMAL HIGH (ref 70–99)
Glucose-Capillary: 306 mg/dL — ABNORMAL HIGH (ref 70–99)
Glucose-Capillary: 307 mg/dL — ABNORMAL HIGH (ref 70–99)

## 2023-07-14 LAB — HIV ANTIBODY (ROUTINE TESTING W REFLEX): HIV Screen 4th Generation wRfx: NONREACTIVE

## 2023-07-14 MED ORDER — INSULIN PUMP
SUBCUTANEOUS | Status: DC
  Administered 2023-07-14: 6 via SUBCUTANEOUS
  Administered 2023-07-14: 3 via SUBCUTANEOUS
  Filled 2023-07-14: qty 1

## 2023-07-14 NOTE — Progress Notes (Signed)
1 Day Post-Op   Subjective/Chief Complaint: Sore, no flatus, feels bloated   Objective: Vital signs in last 24 hours: Temp:  [97.7 F (36.5 C)-99.1 F (37.3 C)] 98.1 F (36.7 C) (10/24 0742) Pulse Rate:  [65-90] 65 (10/24 0742) Resp:  [14-18] 16 (10/24 0405) BP: (126-162)/(63-85) 148/73 (10/24 0742) SpO2:  [95 %-100 %] 95 % (10/24 0742) Weight:  [102.2 kg] 102.2 kg (10/23 1534) Last BM Date : 07/12/23  Intake/Output from previous day: 10/23 0701 - 10/24 0700 In: 420 [I.V.:400; IV Piggyback:20] Out: 810 [Urine:800; Blood:10] Intake/Output this shift: No intake/output data recorded.  Ab mild distended, incisoins without infection, approp tender  Lab Results:  Recent Labs    07/12/23 1839 07/13/23 0255  WBC 9.3 9.5  HGB 13.7 13.6  HCT 40.7 40.6  PLT 213 189   BMET Recent Labs    07/12/23 1839 07/13/23 0255  NA 135 133*  K 4.0 4.0  CL 102 98  CO2 25 21*  GLUCOSE 217* 232*  BUN 7 10  CREATININE 1.14 1.13  CALCIUM 9.0 8.7*   PT/INR No results for input(s): "LABPROT", "INR" in the last 72 hours. ABG No results for input(s): "PHART", "HCO3" in the last 72 hours.  Invalid input(s): "PCO2", "PO2"  Studies/Results: CT ABDOMEN PELVIS W CONTRAST  Result Date: 07/12/2023 CLINICAL DATA:  Acute nonlocalized abdominal pain. EXAM: CT ABDOMEN AND PELVIS WITH CONTRAST TECHNIQUE: Multidetector CT imaging of the abdomen and pelvis was performed using the standard protocol following bolus administration of intravenous contrast. RADIATION DOSE REDUCTION: This exam was performed according to the departmental dose-optimization program which includes automated exposure control, adjustment of the mA and/or kV according to patient size and/or use of iterative reconstruction technique. CONTRAST:  75mL OMNIPAQUE IOHEXOL 350 MG/ML SOLN COMPARISON:  Ultrasound abdomen 09/04/2008 FINDINGS: Lower chest: Nodule in the right costophrenic angle measuring 7 mm diameter. Hepatobiliary:  Cholelithiasis with gallbladder wall thickening and pericholecystic stranding. 3 mm stone demonstrated in the distal cystic duct. No bile duct dilatation. Pancreas: Unremarkable. No pancreatic ductal dilatation or surrounding inflammatory changes. Spleen: Normal in size without focal abnormality. Adrenals/Urinary Tract: Adrenal glands are unremarkable. Kidneys are normal, without renal calculi, focal lesion, or hydronephrosis. Bladder is unremarkable. Stomach/Bowel: Stomach is within normal limits. Appendix appears normal. No evidence of bowel wall thickening, distention, or inflammatory changes. Vascular/Lymphatic: No significant vascular findings are present. No enlarged abdominal or pelvic lymph nodes. Reproductive: Prostate is unremarkable. Other: No abdominal wall hernia or abnormality. No abdominopelvic ascites. Musculoskeletal: No acute or significant osseous findings. IMPRESSION: 1. Cholelithiasis with inflammatory changes around the gallbladder likely indicating acute cholecystitis. 3 mm stone likely in the origin of the cystic duct. No bile duct dilatation. 2. Right solid pulmonary nodule measuring 7 mm. Per Fleischner Society Guidelines, recommend a non-contrast Chest CT at 6-12 months. If patient is high risk for malignancy, consider an additional non-contrast Chest CT at 18-24 months. If patient is low risk for malignancy, non-contrast Chest CT at 18-24 months is optional. These guidelines do not apply to immunocompromised patients and patients with cancer. Follow up in patients with significant comorbidities as clinically warranted. For lung cancer screening, adhere to Lung-RADS guidelines. Reference: Radiology. 2017; 284(1):228-43. Electronically Signed   By: Burman Nieves M.D.   On: 07/12/2023 23:26    Anti-infectives: Anti-infectives (From admission, onward)    Start     Dose/Rate Route Frequency Ordered Stop   07/14/23 0000  cefTRIAXone (ROCEPHIN) 2 g in sodium chloride 0.9 % 100 mL IVPB  2 g 200 mL/hr over 30 Minutes Intravenous Every 24 hours 07/13/23 0212     07/13/23 1000  metroNIDAZOLE (FLAGYL) IVPB 500 mg        500 mg 100 mL/hr over 60 Minutes Intravenous Every 12 hours 07/13/23 0212     07/12/23 2345  cefTRIAXone (ROCEPHIN) 2 g in sodium chloride 0.9 % 100 mL IVPB        2 g 200 mL/hr over 30 Minutes Intravenous  Once 07/12/23 2340 07/13/23 0047   07/12/23 2345  metroNIDAZOLE (FLAGYL) IVPB 500 mg        500 mg 100 mL/hr over 60 Minutes Intravenous  Once 07/12/23 2340 07/13/23 0219       Assessment/Plan: POD 1 lap chole- gangrenous cholecystitis -has ileus not unexpected -will leave on fulls, ambulate, pulm toilet -can stop abx from my standpoint  -hopefully home tomorrow   Emelia Loron 07/14/2023

## 2023-07-14 NOTE — Plan of Care (Signed)
Patient alert/oriented X4 . Patient compliant with medication administration and tolerated full liquid diet. Patient complained of severe abdominal surgical pain. Dilaudid + compazine administered for pain and nausea. Patient ambulated with walker down halls and was up in chair for a few hours. VSS, insulin pump has been keeping his blood sugars around 115-200.   Problem: Education: Goal: Ability to describe self-care measures that may prevent or decrease complications (Diabetes Survival Skills Education) will improve Outcome: Progressing   Problem: Education: Goal: Individualized Educational Video(s) Outcome: Progressing   Problem: Coping: Goal: Ability to adjust to condition or change in health will improve Outcome: Progressing   Problem: Fluid Volume: Goal: Ability to maintain a balanced intake and output will improve Outcome: Progressing   Problem: Health Behavior/Discharge Planning: Goal: Ability to identify and utilize available resources and services will improve Outcome: Progressing   Problem: Health Behavior/Discharge Planning: Goal: Ability to manage health-related needs will improve Outcome: Progressing   Problem: Metabolic: Goal: Ability to maintain appropriate glucose levels will improve Outcome: Progressing   Problem: Nutritional: Goal: Maintenance of adequate nutrition will improve Outcome: Progressing   Problem: Nutritional: Goal: Progress toward achieving an optimal weight will improve Outcome: Progressing   Problem: Skin Integrity: Goal: Risk for impaired skin integrity will decrease Outcome: Progressing   Problem: Tissue Perfusion: Goal: Adequacy of tissue perfusion will improve Outcome: Progressing   Problem: Education: Goal: Knowledge of General Education information will improve Description: Including pain rating scale, medication(s)/side effects and non-pharmacologic comfort measures Outcome: Progressing   Problem: Health Behavior/Discharge  Planning: Goal: Ability to manage health-related needs will improve Outcome: Progressing   Problem: Clinical Measurements: Goal: Ability to maintain clinical measurements within normal limits will improve Outcome: Progressing   Problem: Clinical Measurements: Goal: Will remain free from infection Outcome: Progressing   Problem: Clinical Measurements: Goal: Diagnostic test results will improve Outcome: Progressing   Problem: Clinical Measurements: Goal: Respiratory complications will improve Outcome: Progressing   Problem: Clinical Measurements: Goal: Cardiovascular complication will be avoided Outcome: Progressing   Problem: Coping: Goal: Level of anxiety will decrease Outcome: Progressing   Problem: Elimination: Goal: Will not experience complications related to bowel motility Outcome: Progressing   Problem: Elimination: Goal: Will not experience complications related to urinary retention Outcome: Progressing   Problem: Pain Management: Goal: General experience of comfort will improve Outcome: Progressing   Problem: Safety: Goal: Ability to remain free from injury will improve Outcome: Progressing   Problem: Skin Integrity: Goal: Risk for impaired skin integrity will decrease Outcome: Progressing

## 2023-07-14 NOTE — Progress Notes (Signed)
PROGRESS NOTE  Samuel Valentine ZHY:[id number] DOB: [birth year] DOA: 07/12/2023 PCP: Ronnald Nian, MD   LOS: 1 day   Brief Narrative / Interim history: 52 year old male with history of DM1 on insulin pump, HTN, HLD came into the hospital with right upper quadrant abdominal pain, nausea, was diagnosed with acute cholecystitis.  He is status post laparoscopic cholecystectomy 10/23  Subjective / 24h Interval events: Feeling bloated this morning, has not passed any gas or had any bowel movements.  Complains of significant abdominal soreness.  Also has nausea  Assesement and Plan: Principal problem Acute cholecystitis-patient came to the hospital with classic symptoms, general surgery consulted and patient was taken to the OR on 10/23 and is status post laparoscopic cholecystectomy.  He was found to have gangrenous cholecystitis. -Complains of soreness postop day 1 today, also concern for postoperative ileus  Active problems DM1 -resume home insulin pump today.  Patient has his own supplies.  Place CGM.  Of note, he has history of gastroparesis  Obesity, class I-BMI 30.5.  Scheduled Meds:  enalapril  5 mg Oral Daily   insulin pump   Subcutaneous Q4H   multivitamin with minerals  1 tablet Oral Daily   Continuous Infusions:  cefTRIAXone (ROCEPHIN)  IV 2 g (07/14/23 0046)   metronidazole 500 mg (07/14/23 0920)   PRN Meds:.acetaminophen, bisacodyl, HYDROmorphone (DILAUDID) injection, melatonin, oxyCODONE, polyethylene glycol, prochlorperazine  Current Outpatient Medications  Medication Instructions   aspirin 81 mg, Oral, 2 times daily,     atorvastatin (LIPITOR) 40 mg, Oral, Daily   Continuous Glucose Receiver (DEXCOM G6 RECEIVER) DEVI Does not apply   Continuous Glucose Sensor (DEXCOM G6 SENSOR) MISC 1 Units, Apply externally, See admin instructions, Change sensor every 10 days   Continuous Glucose Transmitter (DEXCOM G6 TRANSMITTER) MISC 1 Device, Does not apply, Continuous    emtricitabine-tenofovir (TRUVADA) 200-300 MG tablet 1 tablet, Oral, Daily, This may be placebo and is study provided. He is also taking cabotegravir/placebo 30mg  daily.   enalapril (VASOTEC) 5 MG tablet TAKE 1 TABLET(5 MG) BY MOUTH TWICE DAILY   insulin aspart (NOVOLOG) 100 UNIT/ML injection ADMINISTER 300 UNITS VIA INSULIN PUMP EVERY 48 HOURS   insulin glargine (LANTUS) 60 Units, Subcutaneous, Daily, Use in case of pump failure   Multiple Vitamin (MULTIVITAMIN) tablet 1 tablet, Oral, Daily,      Diet Orders (From admission, onward)     Start     Ordered   07/14/23 0922  Diet full liquid Fluid consistency: Thin  Diet effective now       Question:  Fluid consistency:  Answer:  Thin   07/14/23 0922            DVT prophylaxis: SCDs Start: 07/13/23 0222   Lab Results  Component Value Date   PLT 189 07/13/2023      Code Status: Full Code  Family Communication: husband at bedside   Status is: Inpatient Remains inpatient appropriate because: severity of illness  Level of care: Med-Surg  Consultants:  General surgery   Objective: Vitals:   07/13/23 1959 07/14/23 0006 07/14/23 0405 07/14/23 0742  BP: (!) 157/74 (!) 146/63 (!) 157/73 (!) 148/73  Pulse: 75 87 87 65  Resp: 16 16 16    Temp: 98.5 F (36.9 C) 99.1 F (37.3 C) 98.8 F (37.1 C) 98.1 F (36.7 C)  TempSrc: Oral Oral Oral   SpO2: 96% 96% 97% 95%  Weight:      Height:        Intake/Output Summary (Last 24  hours) at 07/14/2023 1126 Last data filed at 07/14/2023 0405 Gross per 24 hour  Intake 420 ml  Output 810 ml  Net -390 ml   Wt Readings from Last 3 Encounters:  07/13/23 102.2 kg  06/23/23 102.2 kg  06/15/23 103.4 kg    Examination:  Constitutional: NAD Eyes: no scleral icterus ENMT: Mucous membranes are moist.  Neck: normal, supple Respiratory: clear to auscultation bilaterally, no wheezing, no crackles.  Cardiovascular: Regular rate and rhythm, no murmurs / rubs / gallops. No LE edema.   Abdomen: Appears mild distended Musculoskeletal: no clubbing / cyanosis.   Data Reviewed: I have independently reviewed following labs and imaging studies   CBC Recent Labs  Lab 07/12/23 1839 07/13/23 0255  WBC 9.3 9.5  HGB 13.7 13.6  HCT 40.7 40.6  PLT 213 189  MCV 92.9 94.9  MCH 31.3 31.8  MCHC 33.7 33.5  RDW 13.2 13.1  LYMPHSABS 2.1  --   MONOABS 0.7  --   EOSABS 0.0  --   BASOSABS 0.0  --     Recent Labs  Lab 07/12/23 1839 07/13/23 0255  NA 135 133*  K 4.0 4.0  CL 102 98  CO2 25 21*  GLUCOSE 217* 232*  BUN 7 10  CREATININE 1.14 1.13  CALCIUM 9.0 8.7*  AST 18 51*  ALT 18 28  ALKPHOS 62 74  BILITOT 1.0 1.2  ALBUMIN 3.8 3.5  MG  --  1.8    ------------------------------------------------------------------------------------------------------------------ No results for input(s): "CHOL", "HDL", "LDLCALC", "TRIG", "CHOLHDL", "LDLDIRECT" in the last 72 hours.  Lab Results  Component Value Date   HGBA1C 6.7 (A) 06/15/2023   ------------------------------------------------------------------------------------------------------------------ No results for input(s): "TSH", "T4TOTAL", "T3FREE", "THYROIDAB" in the last 72 hours.  Invalid input(s): "FREET3"  Cardiac Enzymes No results for input(s): "CKMB", "TROPONINI", "MYOGLOBIN" in the last 168 hours.  Invalid input(s): "CK" ------------------------------------------------------------------------------------------------------------------ No results found for: "BNP"  CBG: Recent Labs  Lab 07/13/23 1853 07/13/23 2000 07/14/23 0005 07/14/23 0406 07/14/23 0741  GLUCAP 301* 300* 258* 242* 306*    No results found for this or any previous visit (from the past 240 hour(s)).   Radiology Studies: No results found.   Pamella Pert, MD, PhD [facility]  Between 7 am - 7 pm I am available, please contact me via Amion (for emergencies) or Securechat (non urgent messages)  Between 7 pm - 7 am I am  not available, please contact night coverage MD/APP via Amion

## 2023-07-15 ENCOUNTER — Ambulatory Visit: Payer: BC Managed Care – PPO | Admitting: "Endocrinology

## 2023-07-15 DIAGNOSIS — K810 Acute cholecystitis: Secondary | ICD-10-CM | POA: Diagnosis not present

## 2023-07-15 LAB — COMPREHENSIVE METABOLIC PANEL
ALT: 23 U/L (ref 0–44)
AST: 23 U/L (ref 15–41)
Albumin: 3 g/dL — ABNORMAL LOW (ref 3.5–5.0)
Alkaline Phosphatase: 69 U/L (ref 38–126)
Anion gap: 11 (ref 5–15)
BUN: 7 mg/dL (ref 6–20)
CO2: 24 mmol/L (ref 22–32)
Calcium: 8.9 mg/dL (ref 8.9–10.3)
Chloride: 101 mmol/L (ref 98–111)
Creatinine, Ser: 1.08 mg/dL (ref 0.61–1.24)
GFR, Estimated: >60 mL/min (ref >60)
Glucose, Bld: 122 mg/dL — ABNORMAL HIGH (ref 70–99)
Potassium: 4.1 mmol/L (ref 3.5–5.1)
Sodium: 136 mmol/L (ref 135–145)
Total Bilirubin: 0.5 mg/dL (ref 0.3–1.2)
Total Protein: 6.5 g/dL (ref 6.5–8.1)

## 2023-07-15 LAB — CBC
HCT: 39.3 % (ref 39.0–52.0)
Hemoglobin: 13.4 g/dL (ref 13.0–17.0)
MCH: 32.1 pg (ref 26.0–34.0)
MCHC: 34.1 g/dL (ref 30.0–36.0)
MCV: 94 fL (ref 80.0–100.0)
Platelets: 206 10*3/uL (ref 150–400)
RBC: 4.18 MIL/uL — ABNORMAL LOW (ref 4.22–5.81)
RDW: 13.1 % (ref 11.5–15.5)
WBC: 10.3 10*3/uL (ref 4.0–10.5)
nRBC: 0 % (ref 0.0–0.2)

## 2023-07-15 MED ORDER — OXYCODONE HCL 5 MG PO TABS: 5.0000 mg | ORAL_TABLET | Freq: Four times a day (QID) | ORAL | 0 refills | Status: DC | PRN

## 2023-07-15 MED ORDER — POLYETHYLENE GLYCOL 3350 17 G PO PACK
17.0000 g | PACK | Freq: Every day | ORAL | Status: DC
  Administered 2023-07-15: 17 g via ORAL
  Filled 2023-07-15: qty 1

## 2023-07-15 NOTE — Discharge Instructions (Signed)

## 2023-07-15 NOTE — Discharge Summary (Signed)
Physician Discharge Summary  Samuel Valentine WJX:[id number] DOB: 08/29/1971 DOA: 07/12/2023  PCP: Ronnald Nian, MD  Admit date: 07/12/2023 Discharge date: 07/15/2023  Admitted From: home Disposition:  home  Recommendations for Outpatient Follow-up:  Follow up with PCP in 1-2 weeks Follow up with general surgery in 2 weeks  Home Health: none Equipment/Devices: none  Discharge Condition: stable CODE STATUS: Full code Diet Orders (From admission, onward)     Start     Ordered   07/15/23 0858  Diet vegetarian Room service appropriate? Yes; Fluid consistency: Thin  Diet effective now       Question Answer Comment  Room service appropriate? Yes   Fluid consistency: Thin      07/15/23 0857            HPI: Per admitting MD, Dion Latendresse is a 52 y.o. male with medical history significant for type 1 diabetes on insulin pump, hyperlipidemia, hypertension, who presents to the ED from home due to severe upper abdominal pain.  Onset of symptoms 3 days ago, worse after eating dinner last night.  Associated with nausea and vomiting.  No reported subjective fevers or chills.   Hospital Course / Discharge diagnoses: Principal problem Acute cholecystitis-patient came to the hospital with classic symptoms, general surgery consulted and patient was taken to the OR on 10/23 and is status post laparoscopic cholecystectomy.  He was found to have gangrenous cholecystitis.  He recovered well postoperatively, had mild ileus which is resolving and is tolerating a diet.  He will be discharged home in stable condition with outpatient follow-up with surgery.   Active problems DM1 -continue insulin pump.  Outpatient follow-up  Obesity, class I-BMI 30.5.  Sepsis ruled out   Discharge Instructions   Allergies as of 07/15/2023       Reactions   Nirmatrelvir-ritonavir Diarrhea, Nausea And Vomiting, Nausea Only, Other (See Comments)   Nausea, bradycardia, abdominal pain,    Penicillins  Hives, Nausea And Vomiting        Medication List     TAKE these medications    aspirin 81 MG chewable tablet Chew 81 mg by mouth 2 (two) times daily.   atorvastatin 40 MG tablet Commonly known as: LIPITOR Take 1 tablet (40 mg total) by mouth daily.   Dexcom G6 Receiver Devi by Does not apply route.   Dexcom G6 Sensor Misc Apply 1 Units topically See admin instructions. Change sensor every 10 days   Dexcom G6 Transmitter Misc 1 Device by Does not apply route continuous.   emtricitabine-tenofovir 200-300 MG tablet Commonly known as: TRUVADA Take 1 tablet by mouth daily. This may be placebo and is study provided. He is also taking cabotegravir/placebo 30mg  daily.   enalapril 5 MG tablet Commonly known as: VASOTEC TAKE 1 TABLET(5 MG) BY MOUTH TWICE DAILY   insulin aspart 100 UNIT/ML injection Commonly known as: NovoLOG ADMINISTER 300 UNITS VIA INSULIN PUMP EVERY 48 HOURS   insulin glargine 100 UNIT/ML Solostar Pen Commonly known as: LANTUS Inject 60 Units into the skin daily. Use in case of pump failure   multivitamin tablet Take 1 tablet by mouth daily.   oxyCODONE 5 MG immediate release tablet Commonly known as: Oxy IR/ROXICODONE Take 1 tablet (5 mg total) by mouth every 6 (six) hours as needed for moderate pain (pain score 4-6) or breakthrough pain.        Follow-up Information     Emelia Loron, MD Follow up on 08/05/2023.   Specialty: General Surgery Why: arrive  820 for 850 appointment Contact information: [address] [place] [address] [phone]                 Consultations: General surgery   Procedures/Studies:  CT ABDOMEN PELVIS W CONTRAST  Result Date: 07/12/2023 CLINICAL DATA:  Acute nonlocalized abdominal pain. EXAM: CT ABDOMEN AND PELVIS WITH CONTRAST TECHNIQUE: Multidetector CT imaging of the abdomen and pelvis was performed using the standard protocol following bolus administration of intravenous  contrast. RADIATION DOSE REDUCTION: This exam was performed according to the departmental dose-optimization program which includes automated exposure control, adjustment of the mA and/or kV according to patient size and/or use of iterative reconstruction technique. CONTRAST:  75mL OMNIPAQUE IOHEXOL 350 MG/ML SOLN COMPARISON:  Ultrasound abdomen 09/04/2008 FINDINGS: Lower chest: Nodule in the right costophrenic angle measuring 7 mm diameter. Hepatobiliary: Cholelithiasis with gallbladder wall thickening and pericholecystic stranding. 3 mm stone demonstrated in the distal cystic duct. No bile duct dilatation. Pancreas: Unremarkable. No pancreatic ductal dilatation or surrounding inflammatory changes. Spleen: Normal in size without focal abnormality. Adrenals/Urinary Tract: Adrenal glands are unremarkable. Kidneys are normal, without renal calculi, focal lesion, or hydronephrosis. Bladder is unremarkable. Stomach/Bowel: Stomach is within normal limits. Appendix appears normal. No evidence of bowel wall thickening, distention, or inflammatory changes. Vascular/Lymphatic: No significant vascular findings are present. No enlarged abdominal or pelvic lymph nodes. Reproductive: Prostate is unremarkable. Other: No abdominal wall hernia or abnormality. No abdominopelvic ascites. Musculoskeletal: No acute or significant osseous findings. IMPRESSION: 1. Cholelithiasis with inflammatory changes around the gallbladder likely indicating acute cholecystitis. 3 mm stone likely in the origin of the cystic duct. No bile duct dilatation. 2. Right solid pulmonary nodule measuring 7 mm. Per Fleischner Society Guidelines, recommend a non-contrast Chest CT at 6-12 months. If patient is high risk for malignancy, consider an additional non-contrast Chest CT at 18-24 months. If patient is low risk for malignancy, non-contrast Chest CT at 18-24 months is optional. These guidelines do not apply to immunocompromised patients and patients with  cancer. Follow up in patients with significant comorbidities as clinically warranted. For lung cancer screening, adhere to Lung-RADS guidelines. Reference: Radiology. 2017; 284(1):228-43. Electronically Signed   By: Burman Nieves M.D.   On: 07/12/2023 23:26     Subjective: - no chest pain, shortness of breath, no abdominal pain, nausea or vomiting.   Discharge Exam: BP 127/76   Pulse 88   Temp 99.2 F (37.3 C) (Oral)   Resp 20   Ht 6' (1.829 m)   Wt 102.2 kg   SpO2 97%   BMI 30.56 kg/m   General: Pt is alert, awake, not in acute distress Cardiovascular: RRR, S1/S2 +, no rubs, no gallops Respiratory: CTA bilaterally, no wheezing, no rhonchi Abdominal: Soft, NT, ND, bowel sounds + Extremities: no edema, no cyanosis  The results of significant diagnostics from this hospitalization (including imaging, microbiology, ancillary and laboratory) are listed below for reference.     Microbiology: No results found for this or any previous visit (from the past 240 hour(s)).   Labs: Basic Metabolic Panel: Recent Labs  Lab 07/12/23 1839 07/13/23 0255 07/15/23 0723  NA 135 133* 136  K 4.0 4.0 4.1  CL 102 98 101  CO2 25 21* 24  GLUCOSE 217* 232* 122*  BUN 7 10 7   CREATININE 1.14 1.13 1.08  CALCIUM 9.0 8.7* 8.9  MG  --  1.8  --   PHOS  --  3.3  --    Liver Function Tests:  Recent Labs  Lab 07/12/23 1839 07/13/23 0255 07/15/23 0723  AST 18 51* 23  ALT 18 28 23   ALKPHOS 62 74 69  BILITOT 1.0 1.2 0.5  PROT 6.7 6.5 6.5  ALBUMIN 3.8 3.5 3.0*   CBC: Recent Labs  Lab 07/12/23 1839 07/13/23 0255 07/15/23 0723  WBC 9.3 9.5 10.3  NEUTROABS 6.4  --   --   HGB 13.7 13.6 13.4  HCT 40.7 40.6 39.3  MCV 92.9 94.9 94.0  PLT 213 189 206   CBG: Recent Labs  Lab 07/13/23 2000 07/14/23 0005 07/14/23 0406 07/14/23 0741 07/14/23 1202  GLUCAP 300* 258* 242* 306* 307*   Hgb A1c No results for input(s): "HGBA1C" in the last 72 hours. Lipid Profile No results for input(s):  "CHOL", "HDL", "LDLCALC", "TRIG", "CHOLHDL", "LDLDIRECT" in the last 72 hours. Thyroid function studies No results for input(s): "TSH", "T4TOTAL", "T3FREE", "THYROIDAB" in the last 72 hours.  Invalid input(s): "FREET3" Urinalysis    Component Value Date/Time   COLORURINE YELLOW 07/12/2023 1858   APPEARANCEUR CLEAR 07/12/2023 1858   LABSPEC 1.013 07/12/2023 1858   PHURINE 6.0 07/12/2023 1858   GLUCOSEU NEGATIVE 07/12/2023 1858   HGBUR NEGATIVE 07/12/2023 1858   BILIRUBINUR NEGATIVE 07/12/2023 1858   BILIRUBINUR neg 12/20/2013 1525   KETONESUR 20 (A) 07/12/2023 1858   PROTEINUR NEGATIVE 07/12/2023 1858   UROBILINOGEN negative 12/20/2013 1525   NITRITE NEGATIVE 07/12/2023 1858   LEUKOCYTESUR NEGATIVE 07/12/2023 1858    FURTHER DISCHARGE INSTRUCTIONS:   Get Medicines reviewed and adjusted: Please take all your medications with you for your next visit with your Primary MD   Laboratory/radiological data: Please request your Primary MD to go over all hospital tests and procedure/radiological results at the follow up, please ask your Primary MD to get all Hospital records sent to his/her office.   In some cases, they will be blood work, cultures and biopsy results pending at the time of your discharge. Please request that your primary care M.D. goes through all the records of your hospital data and follows up on these results.   Also Note the following: If you experience worsening of your admission symptoms, develop shortness of breath, life threatening emergency, suicidal or homicidal thoughts you must seek medical attention immediately by calling 911 or calling your MD immediately  if symptoms less severe.   You must read complete instructions/literature along with all the possible adverse reactions/side effects for all the Medicines you take and that have been prescribed to you. Take any new Medicines after you have completely understood and accpet all the possible adverse  reactions/side effects.    Do not drive when taking Pain medications or sleeping medications (Benzodaizepines)   Do not take more than prescribed Pain, Sleep and Anxiety Medications. It is not advisable to combine anxiety,sleep and pain medications without talking with your primary care practitioner   Special Instructions: If you have smoked or chewed Tobacco  in the last 2 yrs please stop smoking, stop any regular Alcohol  and or any Recreational drug use.   Wear Seat belts while driving.   Please note: You were cared for by a hospitalist during your hospital stay. Once you are discharged, your primary care physician will handle any further medical issues. Please note that NO REFILLS for any discharge medications will be authorized once you are discharged, as it is imperative that you return to your primary care physician (or establish a relationship with a primary care physician if you do not have one)  for your post hospital discharge needs so that they can reassess your need for medications and monitor your lab values.  Time coordinating discharge: 35 minutes  SIGNED:  Pamella Pert, MD, PhD 07/15/2023, 3:02 PM

## 2023-07-15 NOTE — Progress Notes (Signed)
2 Days Post-Op   Subjective/Chief Complaint: Doing much better, having flatus   Objective: Vital signs in last 24 hours: Temp:  [98 F (36.7 C)-99.2 F (37.3 C)] 99.2 F (37.3 C) (10/25 0737) Pulse Rate:  [88-104] 88 (10/25 0737) Resp:  [19-20] 20 (10/25 0500) BP: (118-151)/(72-76) 127/76 (10/25 0737) SpO2:  [97 %-99 %] 97 % (10/25 0737) Last BM Date : 07/12/23  Intake/Output from previous day: 10/24 0701 - 10/25 0700 In: 600 [IV Piggyback:600] Out: -  Intake/Output this shift: No intake/output data recorded.  Ab soft approp tender some ecchymosis at incisoins  Lab Results:  Recent Labs    07/13/23 0255 07/15/23 0723  WBC 9.5 10.3  HGB 13.6 13.4  HCT 40.6 39.3  PLT 189 206   BMET Recent Labs    07/13/23 0255 07/15/23 0723  NA 133* 136  K 4.0 4.1  CL 98 101  CO2 21* 24  GLUCOSE 232* 122*  BUN 10 7  CREATININE 1.13 1.08  CALCIUM 8.7* 8.9   PT/INR No results for input(s): "LABPROT", "INR" in the last 72 hours. ABG No results for input(s): "PHART", "HCO3" in the last 72 hours.  Invalid input(s): "PCO2", "PO2"  Studies/Results: No results found.  Anti-infectives: Anti-infectives (From admission, onward)    Start     Dose/Rate Route Frequency Ordered Stop   07/14/23 0000  cefTRIAXone (ROCEPHIN) 2 g in sodium chloride 0.9 % 100 mL IVPB        2 g 200 mL/hr over 30 Minutes Intravenous Every 24 hours 07/13/23 0212     07/13/23 1000  metroNIDAZOLE (FLAGYL) IVPB 500 mg        500 mg 100 mL/hr over 60 Minutes Intravenous Every 12 hours 07/13/23 0212     07/12/23 2345  cefTRIAXone (ROCEPHIN) 2 g in sodium chloride 0.9 % 100 mL IVPB        2 g 200 mL/hr over 30 Minutes Intravenous  Once 07/12/23 2340 07/13/23 0047   07/12/23 2345  metroNIDAZOLE (FLAGYL) IVPB 500 mg        500 mg 100 mL/hr over 60 Minutes Intravenous  Once 07/12/23 2340 07/13/23 0219       Assessment/Plan: POD 2 lap chole- gangrenous cholecystitis -adat -can dc home  Emelia Loron 07/15/2023

## 2023-07-15 NOTE — Anesthesia Postprocedure Evaluation (Signed)
Anesthesia Post Note  Patient: Kostantinos Sokolik  Procedure(s) Performed: LAPAROSCOPIC CHOLECYSTECTOMY     Patient location during evaluation: PACU Anesthesia Type: General Level of consciousness: awake and alert Pain management: pain level controlled Vital Signs Assessment: post-procedure vital signs reviewed and stable Respiratory status: spontaneous breathing, nonlabored ventilation, respiratory function stable and patient connected to nasal cannula oxygen Cardiovascular status: blood pressure returned to baseline and stable Postop Assessment: no apparent nausea or vomiting Anesthetic complications: no  No notable events documented.  Last Vitals:  Vitals:   07/15/23 0500 07/15/23 0737  BP: 118/73 127/76  Pulse: 90 88  Resp: 20 20  Temp: 37.3 C 37.3 C  SpO2: 97% 97%    Last Pain:  Vitals:   07/15/23 0830  TempSrc:   PainSc: 0-No pain   Pain Goal: Patients Stated Pain Goal: 0 (07/15/23 0830)                 Dustin Burrill L Chela Sutphen

## 2023-07-15 NOTE — Progress Notes (Signed)
Patient discharged.  Tech removed PIV.  Reviewed discharge instructions, medications and follow up appts with patient and partner.  Answered questions.  Gave patient copy of discharge instructions.  Patient's partner picked up prescription.  No additional questions or concerns at this time. Tech wheeled patient out to car.

## 2023-07-15 NOTE — Progress Notes (Signed)
Patient has a Libre blood glucose monitor and  insulin pump.  He is refusing blood sugar checks, instead opting to use his Libre and pump to self administer insulin.    See comments in [facility] for blood sugar and administered insulin as stated by patient.

## 2023-07-18 ENCOUNTER — Telehealth: Payer: Self-pay

## 2023-07-18 LAB — SURGICAL PATHOLOGY

## 2023-07-18 NOTE — Transitions of Care (Post Inpatient/ED Visit) (Signed)
07/18/2023  Name: Samuel Valentine MRN: [id number] DOB: February 27, 1971  Today's TOC FU Call Status: Today's TOC FU Call Status:: Successful TOC FU Call Completed TOC FU Call Complete Date: 07/18/23 Patient's Name and Date of Birth confirmed.  Transition Care Management Follow-up Telephone Call Date of Discharge: 07/15/23 Discharge Facility: [facility]) Type of Discharge: Inpatient Admission Primary Inpatient Discharge Diagnosis:: "acute cholecystitis" How have you been since you were released from the hospital?: Better (Pt voices he is doing good-no pain-"a lil cramping at times"-last took pain med last night, BM today, appetite good-cooked dinner last night, cbgs WNL, no N&V, passing gas) Any questions or concerns?: No  Items Reviewed: Did you receive and understand the discharge instructions provided?: Yes Medications obtained,verified, and reconciled?: Yes (Medications Reviewed) Any new allergies since your discharge?: No Dietary orders reviewed?: Yes Type of Diet Ordered:: low salt/heart healthy/carb modified Do you have support at home?: Yes People in Home: spouse Name of Support/Comfort Primary Source: [place]  Medications Reviewed Today: Medications Reviewed Today     Reviewed by Charlyn Minerva, RN (Registered Nurse) on 07/18/23 at 1546  Med List Status: <None>   Medication Order Taking? Sig Documenting Provider Last Dose Status Informant  aspirin 81 MG chewable tablet [id number] Yes Chew 81 mg by mouth 2 (two) times daily.  [provider] Taking Active Self, Pharmacy Records  atorvastatin (LIPITOR) 40 MG tablet [id number] Yes Take 1 tablet (40 mg total) by mouth daily. Altamese [place], MD Taking Active Self, Pharmacy Records  Continuous Glucose Receiver [facility] G6 RECEIVER) DEVI [id number] Yes by Does not apply route. [provider] Taking Active Self, Pharmacy Records  Continuous Glucose Sensor (DEXCOM G6 SENSOR) [place] [id number] Yes Apply 1  Units topically See admin instructions. Change sensor every 10 days Altamese [place], MD Taking Active Self, Pharmacy Records  Continuous Glucose Transmitter (DEXCOM G6 TRANSMITTER) MISC [id number] Yes 1 Device by Does not apply route continuous. Altamese [place], MD Taking Active Self, Pharmacy Records  emtricitabine-tenofovir (TRUVADA) 200-300 MG tablet [id number] Yes Take 1 tablet by mouth daily. This may be placebo and is study provided. He is also taking cabotegravir/placebo 30mg  daily. Ronnald Nian, MD Taking Active Self, Pharmacy Records  enalapril (VASOTEC) 5 MG tablet [id number] Yes TAKE 1 TABLET(5 MG) BY MOUTH TWICE DAILY David Stall, MD Taking Active Self, Pharmacy Records  insulin aspart (NOVOLOG) 100 UNIT/ML injection [id number] Yes ADMINISTER 300 UNITS VIA INSULIN PUMP EVERY 48 HOURS Motwani, Komal, MD Taking Active Self, Pharmacy Records  insulin glargine (LANTUS) 100 UNIT/ML Solostar Pen [id number] Yes Inject 60 Units into the skin daily. Use in case of pump failure Altamese [place], MD Taking Active Self, Pharmacy Records  Multiple Vitamin (MULTIVITAMIN) tablet [id number] Yes Take 1 tablet by mouth daily.   [provider] Taking Active Self, Pharmacy Records  oxyCODONE (OXY IR/ROXICODONE) 5 MG immediate release tablet [id number] Yes Take 1 tablet (5 mg total) by mouth every 6 (six) hours as needed for moderate pain (pain score 4-6) or breakthrough pain. Emelia Loron, MD Taking Active             Home Care and Equipment/Supplies: Were Home Health Services Ordered?: NA Any new equipment or medical supplies ordered?: NA  Functional Questionnaire: Do you need assistance with bathing/showering or dressing?: No Do you need assistance with meal preparation?: No Do you need assistance with eating?: No Do you have difficulty maintaining continence: No Do you need assistance with getting out of bed/getting out of a chair/moving?: No Do  you have difficulty managing or  taking your medications?: No  Follow up appointments reviewed: PCP Follow-up appointment confirmed?: Yes Date of PCP follow-up appointment?: 08/11/23 (care guide assisted with making appt while on call per pt request to see PCP after surgeon f/u appt) Follow-up Provider: Dr. Doree Albee Follow-up appointment confirmed?: Yes Date of Specialist follow-up appointment?: 08/05/23 Follow-Up Specialty Provider:: Dr. Dwain Sarna Do you need transportation to your follow-up appointment?: No (reviewed wiht pt d/c instructions regarding resuming driving-he voiced  understanfing) Do you understand care options if your condition(s) worsen?: Yes-patient verbalized understanding  SDOH Interventions Today    Flowsheet Row Most Recent Value  SDOH Interventions   Food Insecurity Interventions Intervention Not Indicated  Transportation Interventions Intervention Not Indicated       Antionette Fairy, RN,BSN,CCM RN Care Manager Transitions of Care  [facility]  Direct Phone: [phone] Toll Free: [phone] Fax: [phone]

## 2023-08-10 ENCOUNTER — Ambulatory Visit (INDEPENDENT_AMBULATORY_CARE_PROVIDER_SITE_OTHER): Payer: BC Managed Care – PPO | Admitting: Endocrinology

## 2023-08-10 ENCOUNTER — Encounter: Payer: Self-pay | Admitting: Endocrinology

## 2023-08-10 VITALS — BP 110/60 | HR 73 | Resp 20 | Ht 72.0 in | Wt 224.4 lb

## 2023-08-10 DIAGNOSIS — E109 Type 1 diabetes mellitus without complications: Secondary | ICD-10-CM

## 2023-08-10 DIAGNOSIS — E1065 Type 1 diabetes mellitus with hyperglycemia: Secondary | ICD-10-CM | POA: Diagnosis not present

## 2023-08-10 MED ORDER — INSULIN ASPART 100 UNIT/ML IJ SOLN: INTRAMUSCULAR | 1 refills | Status: DC

## 2023-08-10 MED ORDER — ATORVASTATIN CALCIUM 40 MG PO TABS: 40.0000 mg | ORAL_TABLET | Freq: Every day | ORAL | 3 refills | Status: AC

## 2023-08-10 MED ORDER — DEXCOM G6 SENSOR MISC: 1.0000 | 3 refills | Status: AC

## 2023-08-10 MED ORDER — DEXCOM G6 TRANSMITTER MISC: 1.0000 | 3 refills | Status: DC

## 2023-08-10 MED ORDER — DEXCOM G6 TRANSMITTER MISC: 1.0000 | 3 refills | Status: AC

## 2023-08-10 NOTE — Progress Notes (Signed)
Outpatient Endocrinology Note Samuel Adelyna Brockman, MD  08/10/23  Patient's Name: Samuel Valentine    DOB: 1970/10/31    MRN: [id number]                                                    REASON OF VISIT: Follow up of type 1 diabetes mellitus  PCP: Ronnald Nian, MD  HISTORY OF PRESENT ILLNESS:   Samuel Valentine is a 52 y.o. old male with past medical history listed below, is here for follow up of type 1 diabetes mellitus.  Patient was previously seen by Dr. Fayrene Fearing in this clinic.  Pertinent Diabetes History: Patient was diagnosed with type 1 diabetes mellitus at the age of 65 year.  He has been mostly following with endocrinology for the diabetes care.  Mostly he was following with endocrinology in [place], moved to [place] in 2019, was seeing endocrinology over there and return to [place] area in March 2024 and established endocrinology care for the diabetes.    Patient was initially diagnosed and managed as type 2 diabetes mellitus in 2002 - 2003 timeframe, he was initially treated with metformin, Avandia and Amaryl.  Insulin therapy was started in 2005, he used to be on basal bolus regimen with Lantus and NovoLog.  He was seeing Dr. 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  He started pump therapy and had Medtronic 508 insulin pump in August 2006.  Chronic Diabetes Complications : Retinopathy: no. Last ophthalmology exam was done on annually, following with ophthalmology regularly.  Nephropathy: no, on ACE/ARB / enalapril Peripheral neuropathy: no Coronary artery disease: no Stroke: no  Relevant comorbidities and cardiovascular risk factors: Obesity: yes Body mass index is 30.43 kg/m.  Hypertension: Yes  Hyperlipidemia : Yes, on statin.   Current / Home Diabetic regimen includes:  Tandem t:slim with Dexcom G6 on control IQ.  Using NovoLog U100. Insulin Pump setting:   Basal MN- 0.925u/hour 3AM- 1.45  8AM- 1.250 12PM- 0.90 7PM-   1.200 10PM-  1.200  Bolus CHO Ratio (1unit:CHO) MN- 1:8.5  Correction/Sensitivity: MN- 1:30  Target: 110   Active insulin time: 5 hours  He has settings for a steroid use and exercise as well which he uses rarely.  He generally count of the insulin delivered about 2 hours prior to exercise to avoid hypoglycemia rather than using activity mode.  Prior diabetic medications: Metformin Amaryl,Avandia and basal bolus regimen in the past.  Medtronic pump in the past.  CONTINUOUS GLUCOSE MONITORING SYSTEM (CGMS) / INSULIN PUMP INTERPRETATION:                         Tandem Pump & Sensor Download (Reviewed and summarized below.) Pump: Dexcom G6 and Tandem t:slim Dates: October 7 to July 10, 2023, 14 days  Glucose Management Indicator: 7.7%  Average total daily insulin:  59 units, Basal: 57%, Bolus: 43%.      Trends:  He has frequent hyperglycemia with blood sugar up to 250-300 range with meals related with mostly inadequate meal bolus, mixed meal bolus and late meal bolus.  Blood sugar in between the meals mostly acceptable.  He has been using sleep more overnight and has been correcting manual bolus at times  at night.  Rare mild hypoglycemia related with stacking from auto correction bolus, manual and meal bolus.  No significant hypoglycemia.  Hypoglycemia: Patient has no hypoglycemic episodes. Patient has hypoglycemia awareness.    Factors modifying glucose control: 1.  Diabetic diet assessment: 3 meals a day and sometimes late night/bedtime snack.  2.  Staying active or exercising:   3.  Medication compliance: compliant most of the time.  Interval history 08/10/23 Patient had cholecystectomy last month, initially presented with acute cholecystitis.  Patient reports he has been recovering from surgery, he is getting used to on eating as usual.  Pump and CGM data as reviewed above he has mostly  postprandial hyperglycemia.  CGM showing GMI of 7.7%.  He has no other complaints today.  REVIEW OF SYSTEMS As per history of present illness.   PAST MEDICAL HISTORY: Past Medical History:  Diagnosis Date   Chronic hepatitis B (HCC)    followed at [facility]   Hypercholesterolemia    Type 1 diabetes (HCC)    Dr. Fransico Michael    PAST SURGICAL HISTORY: Past Surgical History:  Procedure Laterality Date   CHOLECYSTECTOMY N/A 07/13/2023   Procedure: LAPAROSCOPIC CHOLECYSTECTOMY;  Surgeon: Emelia Loron, MD;  Location: [facility] OR;  Service: General;  Laterality: N/A;    ALLERGIES: Allergies  Allergen Reactions   Nirmatrelvir-Ritonavir Diarrhea, Nausea And Vomiting, Nausea Only and Other (See Comments)    Nausea, bradycardia, abdominal pain,    Penicillins Hives and Nausea And Vomiting    FAMILY HISTORY:  History reviewed. No pertinent family history.  SOCIAL HISTORY: Social History   Socioeconomic History   Marital status: Married    Spouse name: Not on file   Number of children: Not on file   Years of education: Not on file   Highest education level: Bachelor's degree (e.g., BA, AB, BS)  Occupational History   Occupation: Programmer, multimedia: [employer]  Tobacco Use   Smoking status: Never   Smokeless tobacco: Never  Vaping Use   Vaping status: Never Used  Substance and Sexual Activity   Alcohol use: Yes    Comment: maybe one drink per month   Drug use: No   Sexual activity: Yes    Partners: Male  Other Topics Concern   Not on file  Social History Narrative   Same sex marriage, 2 cats   Social Determinants of Health   Financial Resource Strain: Low Risk  (06/22/2023)   Overall Financial Resource Strain (CARDIA)    Difficulty of Paying Living Expenses: Not very hard  Food Insecurity: No Food Insecurity (07/18/2023)   Hunger Vital Sign    Worried About Running Out of Food in the Last Year: Never true    Ran Out of Food in the Last Year: Never true   Transportation Needs: No Transportation Needs (07/18/2023)   PRAPARE - Administrator, Civil Service (Medical): No    Lack of Transportation (Non-Medical): No  Physical Activity: Insufficiently Active (06/22/2023)   Exercise Vital Sign    Days of Exercise per Week: 3 days    Minutes of Exercise per Session: 20 min  Stress: Stress Concern Present (06/22/2023)   [employer] of Occupational Health - Occupational Stress Questionnaire    Feeling of Stress : To some extent  Social Connections: Unknown (07/18/2023)   [facility] from [facility]   Social Network    Social Network: Not on file  Recent Concern: Social Connections - Socially Isolated (06/22/2023)   Social Connection  and Isolation Panel [NHANES]    Frequency of Communication with Friends and Family: Never    Frequency of Social Gatherings with Friends and Family: Once a week    Attends Religious Services: Never    Database administrator or Organizations: No    Attends Engineer, structural: Not on file    Marital Status: Married    MEDICATIONS:  Current Outpatient Medications  Medication Sig Dispense Refill   aspirin 81 MG chewable tablet Chew 81 mg by mouth 2 (two) times daily.      Continuous Glucose Receiver (DEXCOM G6 RECEIVER) DEVI by Does not apply route.     Continuous Glucose Sensor (DEXCOM G6 SENSOR) MISC Inject 1 Device into the skin continuous for 10 days. 9 each 3   emtricitabine-tenofovir (TRUVADA) 200-300 MG tablet Take 1 tablet by mouth daily. This may be placebo and is study provided. He is also taking cabotegravir/placebo 30mg  daily. 90 tablet 0   enalapril (VASOTEC) 5 MG tablet TAKE 1 TABLET(5 MG) BY MOUTH TWICE DAILY 180 tablet 3   insulin glargine (LANTUS) 100 UNIT/ML Solostar Pen Inject 60 Units into the skin daily. Use in case of pump failure 3 mL 2   Multiple Vitamin (MULTIVITAMIN) tablet Take 1 tablet by mouth daily.       oxyCODONE (OXY IR/ROXICODONE) 5 MG immediate release  tablet Take 1 tablet (5 mg total) by mouth every 6 (six) hours as needed for moderate pain (pain score 4-6) or breakthrough pain. 10 tablet 0   atorvastatin (LIPITOR) 40 MG tablet Take 1 tablet (40 mg total) by mouth daily. 90 tablet 3   Continuous Glucose Transmitter (DEXCOM G6 TRANSMITTER) MISC 1 Device by Does not apply route continuous. 1 each 3   insulin aspart (NOVOLOG) 100 UNIT/ML injection ADMINISTER 300 UNITS VIA INSULIN PUMP EVERY 48 HOURS 120 mL 1   No current facility-administered medications for this visit.    PHYSICAL EXAM: Vitals:   08/10/23 0808  BP: 110/60  Pulse: 73  Resp: 20  SpO2: 97%  Weight: 224 lb 6.4 oz (101.8 kg)  Height: 6' (1.829 m)   Body mass index is 30.43 kg/m.  Wt Readings from Last 3 Encounters:  08/10/23 224 lb 6.4 oz (101.8 kg)  07/13/23 225 lb 5 oz (102.2 kg)  06/23/23 225 lb 6.4 oz (102.2 kg)    General: Well developed, well nourished male in no apparent distress.  HEENT: AT/[place], no external lesions.  Eyes: Conjunctiva clear and no icterus. Neck: Neck supple  Lungs: Respirations not labored Neurologic: Alert, oriented, normal speech Extremities / Skin: Dry.  Psychiatric: Does not appear depressed or anxious  Diabetic Foot Exam - Simple   No data filed    LABS Reviewed Lab Results  Component Value Date   HGBA1C 6.7 (A) 06/15/2023   HGBA1C 6.6 (A) 02/24/2023   HGBA1C 7.8 (A) 04/11/2018   No results found for: "FRUCTOSAMINE" Lab Results  Component Value Date   CHOL 175 02/24/2023   HDL 71.40 02/24/2023   LDLCALC 92 02/24/2023   TRIG 55.0 02/24/2023   CHOLHDL 2 02/24/2023   Lab Results  Component Value Date   MICRALBCREAT 0.5 02/24/2023   MICRALBCREAT 3 05/26/2017   Lab Results  Component Value Date   CREATININE 1.08 07/15/2023   No results found for: "GFR"  ASSESSMENT / PLAN  1. Type 1 diabetes mellitus without complication (HCC)   2. Uncontrolled type 1 diabetes mellitus with hyperglycemia (HCC)     Diabetes  Mellitus  type 1, complicated by no known complications. - Diabetic status / severity: Controlled.  Lab Results  Component Value Date   HGBA1C 6.7 (A) 06/15/2023    - Hemoglobin A1c goal <7%   Recent blood sugar with hyperglycemia and GMI 7.7% on CGM likely related with recent hospitalization and different eating pattern after surgery.  Discussed about mealtime bolus 10 to 15 minutes before eating and asked not to miss meal boluses.  Changed pump setting as follows.  - Medications:  Insulin pump setting changed as follows:  Tandem t:slim with Dexcom G6 on control IQ.  Using NovoLog U100. Insulin Pump setting:  Basal MN- 0.925u/hour 3AM- 1.45  8AM- 1.250 12PM- 0.90 7PM-   1.200 10PM-  1.200  Bolus CHO Ratio (1unit:CHO) MN- 1:8.5, changed to 1:8.  Correction/Sensitivity: MN- 1:30  Target: 110   Active insulin time: 5 hours  - Home glucose testing: continue CGM /Dexcom G6 and check blood glucose as needed.  - Discussed/ Gave Hypoglycemia treatment plan.  # Consult : not required at this time.   # Annual urine for microalbuminuria/ creatinine ratio, no microalbuminuria currently, continue ACE/ARB /enalapril. Last  Lab Results  Component Value Date   MICRALBCREAT 0.5 02/24/2023    # Foot check nightly.  # Annual dilated diabetic eye exams.  Will prompt ophthalmology for diabetic eye exam.  - Diet: Make healthy diabetic food choices - Life style / activity / exercise: Discussed.  2. Blood pressure  -  BP Readings from Last 1 Encounters:  08/10/23 110/60    - Control is in target.  - No change in current plans.  3. Lipid status / Hyperlipidemia - Last  Lab Results  Component Value Date   LDLCALC 92 02/24/2023   - Continue atorvastatin 40 mg daily.  Diagnoses and all orders for this visit:  Type 1 diabetes mellitus without complication (HCC) -     Ambulatory referral to Ophthalmology  Uncontrolled type 1 diabetes mellitus with hyperglycemia  (HCC) -     atorvastatin (LIPITOR) 40 MG tablet; Take 1 tablet (40 mg total) by mouth daily. -     insulin aspart (NOVOLOG) 100 UNIT/ML injection; ADMINISTER 300 UNITS VIA INSULIN PUMP EVERY 48 HOURS  Other orders -     Discontinue: Continuous Glucose Transmitter (DEXCOM G6 TRANSMITTER) MISC; 1 Device by Does not apply route continuous. -     Continuous Glucose Sensor (DEXCOM G6 SENSOR) MISC; Inject 1 Device into the skin continuous for 10 days. -     Continuous Glucose Transmitter (DEXCOM G6 TRANSMITTER) MISC; 1 Device by Does not apply route continuous.    DISPOSITION Follow up in clinic in 3 months suggested.   All questions answered and patient verbalized understanding of the plan.  Samuel Aariz Maish, MD [facility] Endocrinology [facility] [address] [place], [address] [place], [place] [address] Phone # [phone]  At least part of this note was generated using voice recognition software. Inadvertent word errors may have occurred, which were not recognized during the proofreading process.

## 2023-08-11 ENCOUNTER — Ambulatory Visit: Payer: BC Managed Care – PPO | Admitting: Family Medicine

## 2023-08-11 VITALS — BP 128/70 | HR 72 | Wt 223.4 lb

## 2023-08-11 DIAGNOSIS — Z9049 Acquired absence of other specified parts of digestive tract: Secondary | ICD-10-CM

## 2023-08-11 DIAGNOSIS — M7702 Medial epicondylitis, left elbow: Secondary | ICD-10-CM | POA: Diagnosis not present

## 2023-08-11 DIAGNOSIS — E109 Type 1 diabetes mellitus without complications: Secondary | ICD-10-CM

## 2023-08-11 NOTE — Progress Notes (Signed)
   Subjective:    Patient ID: Samuel Valentine, male    DOB: 03-06-1971, 52 y.o.   MRN: [id number]  HPI He is here for a follow-up visit after recent hospitalization and treatment for acute cholecystitis.  He did have a lap chole and has done well postoperatively.  He is now back on his insulin pump and seems to doing quite nicely with that.  His most recent A1c was 6.4.  He also complains of a several year history of left medial elbow tenderness.  He apparently did have injections but he points to the posterior aspect of the olecranon process.  Otherwise he has no particular concerns or complaints.   Review of Systems     Objective:    Physical Exam  Alert and in no distress.  Abdominal exam does show recent scarring.  No tenderness to palpation.  Exam of the left elbow does show tenderness palpation over the left medial epicondyle.      Assessment & Plan:  History of laparoscopic cholecystectomy  Medial epicondylitis of elbow, left - Plan: Ambulatory referral to Physical Therapy  Type 1 diabetes mellitus without complication (HCC) He does an excellent job of taking care of his diabetes and he will continue be followed by endocrinology.  No further care needed for the cholecystectomy. Discussed treatment of the elbow and explained that rather than an injection I think physical therapy and iontophoresis would be a much better way to handle this and if continued difficulty we might consider an injection.  The previous injection sounds if it was actually into the joint rather than near the epicondyle.

## 2023-09-13 ENCOUNTER — Other Ambulatory Visit: Payer: Self-pay | Admitting: Family Medicine

## 2023-09-13 DIAGNOSIS — Z209 Contact with and (suspected) exposure to unspecified communicable disease: Secondary | ICD-10-CM

## 2023-09-28 ENCOUNTER — Other Ambulatory Visit: Payer: Self-pay | Admitting: "Endocrinology

## 2023-09-28 DIAGNOSIS — E1065 Type 1 diabetes mellitus with hyperglycemia: Secondary | ICD-10-CM

## 2023-11-09 ENCOUNTER — Other Ambulatory Visit: Payer: Self-pay | Admitting: Physical Medicine and Rehabilitation

## 2023-11-09 DIAGNOSIS — M5412 Radiculopathy, cervical region: Secondary | ICD-10-CM

## 2023-11-10 ENCOUNTER — Ambulatory Visit: Payer: BC Managed Care – PPO | Admitting: Endocrinology

## 2023-11-15 ENCOUNTER — Encounter: Payer: Self-pay | Admitting: Internal Medicine

## 2023-11-16 ENCOUNTER — Telehealth: Payer: 59 | Admitting: Family Medicine

## 2023-11-16 DIAGNOSIS — J069 Acute upper respiratory infection, unspecified: Secondary | ICD-10-CM | POA: Diagnosis not present

## 2023-11-16 DIAGNOSIS — B9689 Other specified bacterial agents as the cause of diseases classified elsewhere: Secondary | ICD-10-CM

## 2023-11-16 MED ORDER — BENZONATATE 100 MG PO CAPS: 100.0000 mg | ORAL_CAPSULE | Freq: Three times a day (TID) | ORAL | 0 refills | Status: AC | PRN

## 2023-11-16 MED ORDER — AZITHROMYCIN 250 MG PO TABS: ORAL_TABLET | ORAL | 0 refills | Status: AC

## 2023-11-16 MED ORDER — ALBUTEROL SULFATE HFA 108 (90 BASE) MCG/ACT IN AERS
1.0000 | INHALATION_SPRAY | Freq: Four times a day (QID) | RESPIRATORY_TRACT | 0 refills | Status: AC | PRN
Start: 2023-11-16 — End: ?

## 2023-11-16 NOTE — Progress Notes (Signed)
 E-Visit for Cough   We are sorry that you are not feeling well.  Here is how we plan to help!  Based on your presentation I believe you most likely have A cough due to bacteria.  When patients have a fever and a productive cough with a change in color or increased sputum production, we are concerned about bacterial bronchitis.  If left untreated it can progress to pneumonia.  If your symptoms do not improve with your treatment plan it is important that you contact your provider.   I have prescribed Azithromyin 250 mg: two tablets now and then one tablet daily for 4 additonal days    In addition you may use A non-prescription cough medication called Mucinex DM: take 2 tablets every 12 hours. and A prescription cough medication called Tessalon Perles 100mg . You may take 1-2 capsules every 8 hours as needed for your cough.  From your responses in the eVisit questionnaire you describe inflammation in the upper respiratory tract which is causing a significant cough.  This is commonly called Bronchitis and has four common causes:   Allergies Viral Infections Acid Reflux Bacterial Infection Allergies, viruses and acid reflux are treated by controlling symptoms or eliminating the cause. An example might be a cough caused by taking certain blood pressure medications. You stop the cough by changing the medication. Another example might be a cough caused by acid reflux. Controlling the reflux helps control the cough.     HOME CARE Only take medications as instructed by your medical team. Complete the entire course of an antibiotic. Drink plenty of fluids and get plenty of rest. Avoid close contacts especially the very young and the elderly Cover your mouth if you cough or cough into your sleeve. Always remember to wash your hands A steam or ultrasonic humidifier can help congestion.   GET HELP RIGHT AWAY IF: You develop worsening fever. You become short of breath You cough up blood. Your symptoms  persist after you have completed your treatment plan MAKE SURE YOU  Understand these instructions. Will watch your condition. Will get help right away if you are not doing well or get worse.    Thank you for choosing an e-visit.  Your e-visit answers were reviewed by a board certified advanced clinical practitioner to complete your personal care plan. Depending upon the condition, your plan could have included both over the counter or prescription medications.  Please review your pharmacy choice. Make sure the pharmacy is open so you can pick up prescription now. If there is a problem, you may contact your provider through [employer] and have the prescription routed to another pharmacy.  Your safety is important to [place]. If you have drug allergies check your prescription carefully.   For the next 24 hours you can use MyChart to ask questions about today's visit, request a non-urgent call back, or ask for a work or school excuse. You will get an email in the next two days asking about your experience. I hope that your e-visit has been valuable and will speed your recovery.    I have spent 5 minutes in review of e-visit questionnaire, review and updating patient chart, medical decision making and response to patient.   Margaretann Loveless, PA-C

## 2023-11-16 NOTE — Addendum Note (Signed)
 Addended by: Margaretann Loveless on: 11/16/2023 12:25 PM   Modules accepted: Orders

## 2023-11-28 ENCOUNTER — Ambulatory Visit: Payer: 59 | Admitting: Physical Medicine and Rehabilitation

## 2023-11-28 ENCOUNTER — Other Ambulatory Visit: Payer: Self-pay

## 2023-11-28 ENCOUNTER — Encounter: Payer: Self-pay | Admitting: Physical Medicine and Rehabilitation

## 2023-11-28 ENCOUNTER — Ambulatory Visit: Payer: 59 | Admitting: Physical Therapy

## 2023-11-28 VITALS — BP 152/87 | HR 60

## 2023-11-28 DIAGNOSIS — M542 Cervicalgia: Secondary | ICD-10-CM | POA: Diagnosis not present

## 2023-11-28 DIAGNOSIS — M5412 Radiculopathy, cervical region: Secondary | ICD-10-CM | POA: Diagnosis not present

## 2023-11-28 DIAGNOSIS — M898X1 Other specified disorders of bone, shoulder: Secondary | ICD-10-CM | POA: Diagnosis not present

## 2023-11-28 DIAGNOSIS — M7918 Myalgia, other site: Secondary | ICD-10-CM | POA: Diagnosis not present

## 2023-11-28 DIAGNOSIS — E1042 Type 1 diabetes mellitus with diabetic polyneuropathy: Secondary | ICD-10-CM

## 2023-11-28 MED ORDER — METHYLPREDNISOLONE ACETATE 40 MG/ML IJ SUSP
40.0000 mg | Freq: Once | INTRAMUSCULAR | Status: AC
  Administered 2023-11-28: 40 mg

## 2023-11-28 NOTE — Progress Notes (Signed)
 Pain Scale   Average Pain 4        +Driver, -BT, -Dye Allergies.

## 2023-11-28 NOTE — Patient Instructions (Signed)

## 2023-12-05 ENCOUNTER — Other Ambulatory Visit: Payer: Self-pay | Admitting: Family Medicine

## 2023-12-05 DIAGNOSIS — Z209 Contact with and (suspected) exposure to unspecified communicable disease: Secondary | ICD-10-CM

## 2023-12-05 NOTE — Telephone Encounter (Signed)
 Last apt 08/11/23.

## 2023-12-06 NOTE — Procedures (Signed)
 Cervical Epidural Steroid Injection - Interlaminar Approach with Fluoroscopic Guidance  Patient: Samuel Valentine      Date of Birth: 1971/09/14 MRN: [id number] PCP: Ronnald Nian, MD      Visit Date: 11/28/2023   Universal Protocol:    Date/Time: 03/18/258:20 AM  Consent Given By: the patient  Position: PRONE  Additional Comments: Vital signs were monitored before and after the procedure. Patient was prepped and draped in the usual sterile fashion. The correct patient, procedure, and site was verified.   Injection Procedure Details:   Procedure diagnoses: Cervical radiculopathy [M54.12]    Meds Administered:  Meds ordered this encounter  Medications   methylPREDNISolone acetate (DEPO-MEDROL) injection 40 mg     Laterality: Left  Location/Site: C7-T1  Needle: 3.5 in., 20 ga. Tuohy  Needle Placement: Paramedian epidural space  Findings:  -Comments: Excellent flow of contrast into the epidural space.  Procedure Details: Using a paramedian approach from the side mentioned above, the region overlying the inferior lamina was localized under fluoroscopic visualization and the soft tissues overlying this structure were infiltrated with 4 ml. of 1% Lidocaine without Epinephrine. A # 20 gauge, Tuohy needle was inserted into the epidural space using a paramedian approach.  The epidural space was localized using loss of resistance along with contralateral oblique bi-planar fluoroscopic views.  After negative aspirate for air, blood, and CSF, a 2 ml. volume of Isovue-250 was injected into the epidural space and the flow of contrast was observed. Radiographs were obtained for documentation purposes.   The injectate was administered into the level noted above.  Additional Comments:  No complications occurred Dressing: 2 x 2 sterile gauze and Band-Aid    Post-procedure details: Patient was observed during the procedure. Post-procedure instructions were reviewed.  Patient left  the clinic in stable condition.

## 2023-12-06 NOTE — Progress Notes (Signed)
 Samuel Valentine - 53 y.o. male MRN [id number]  Date of birth: 1971-06-27  Office Visit Note: Visit Date: 11/28/2023 PCP: Ronnald Nian, MD Referred by: Ronnald Nian, MD  Subjective: Chief Complaint  Patient presents with   Neck - Pain   HPI:  Samuel Valentine is a 53 y.o. male who comes in today for planned repeat Left C7-T1  Cervical Interlaminar epidural steroid injection with fluoroscopic guidance.  The patient has failed conservative care including home exercise, medications, time and activity modification.  This injection will be diagnostic and hopefully therapeutic.  Please see requesting physician notes for further details and justification. Patient received more than 50% pain relief from prior injection. He obtained many months of relief without new trauma or red flag signs or symptoms.  He also complains today of more new onset scapular pain and thoracic pain and some low back pain.  He started exercising more and doing more kickboxing type maneuvers and he has noticed some increasing pain over that time.  No specific injury to his lower back or mid back.  Pain does seem to be different than his neck pain and shoulder and arm.  Symptoms are worse with activity the vomiting going on now for a few weeks.  No red flag symptoms here either.  No specific recent imaging of the lumbar or thoracic spine.  Does have a history of type 1 diabetes with diabetic neuropathy.   I spent more than 30 minutes speaking face-to-face with the patient with 50% of the time in counseling and discussing coordination of care.   Referring: Ellin Goodie, FNP   Review of Systems  Musculoskeletal:  Positive for back pain, joint pain and neck pain.  Neurological:  Positive for tingling.  All other systems reviewed and are negative.  Otherwise per HPI.  Assessment & Plan: Visit Diagnoses:    ICD-10-CM   1. Cervical radiculopathy  M54.12 XR C-ARM NO REPORT    Epidural Steroid injection     methylPREDNISolone acetate (DEPO-MEDROL) injection 40 mg    2. Cervicalgia  M54.2     3. Pain of left scapula  M89.8X1     4. Myofascial pain syndrome  M79.18     5. Diabetic peripheral neuropathy associated with type 1 diabetes mellitus (HCC)  E10.42       Plan: Findings:  1.  Continued neck and shoulder pain consistent with prior history of cervical spondylitic changes with narrowing and good relief with prior epidural injection.  With no new complaints of that we did repeat that today he did quite well in the past.  Depending on outcome though we could look at further physical therapy or different treatments for his cervical spine.  He does have some myofascial component.  2.  New onset scapular and mid back pain and some low back pain with increased activities.  I think this was more sprain strain type activity from increasing his workload which I think is good for him we had a long talk about exercise and strengthening etc.  At this point we would have him come in for an office visit if it continued to bother him and got progressively worse.  Right now I think this will be self-limiting.  Encouraged him to continue with activity.    Meds & Orders:  Meds ordered this encounter  Medications   methylPREDNISolone acetate (DEPO-MEDROL) injection 40 mg    Orders Placed This Encounter  Procedures   XR C-ARM NO REPORT   Epidural Steroid  injection    Follow-up: Return if symptoms worsen or fail to improve.   Procedures: No procedures performed  Cervical Epidural Steroid Injection - Interlaminar Approach with Fluoroscopic Guidance  Patient: Samuel Valentine      Date of Birth: 1971-01-25 MRN: [id number] PCP: Ronnald Nian, MD      Visit Date: 11/28/2023   Universal Protocol:    Date/Time: 03/18/258:20 AM  Consent Given By: the patient  Position: PRONE  Additional Comments: Vital signs were monitored before and after the procedure. Patient was prepped and draped in the usual  sterile fashion. The correct patient, procedure, and site was verified.   Injection Procedure Details:   Procedure diagnoses: Cervical radiculopathy [M54.12]    Meds Administered:  Meds ordered this encounter  Medications   methylPREDNISolone acetate (DEPO-MEDROL) injection 40 mg     Laterality: Left  Location/Site: C7-T1  Needle: 3.5 in., 20 ga. Tuohy  Needle Placement: Paramedian epidural space  Findings:  -Comments: Excellent flow of contrast into the epidural space.  Procedure Details: Using a paramedian approach from the side mentioned above, the region overlying the inferior lamina was localized under fluoroscopic visualization and the soft tissues overlying this structure were infiltrated with 4 ml. of 1% Lidocaine without Epinephrine. A # 20 gauge, Tuohy needle was inserted into the epidural space using a paramedian approach.  The epidural space was localized using loss of resistance along with contralateral oblique bi-planar fluoroscopic views.  After negative aspirate for air, blood, and CSF, a 2 ml. volume of Isovue-250 was injected into the epidural space and the flow of contrast was observed. Radiographs were obtained for documentation purposes.   The injectate was administered into the level noted above.  Additional Comments:  No complications occurred Dressing: 2 x 2 sterile gauze and Band-Aid    Post-procedure details: Patient was observed during the procedure. Post-procedure instructions were reviewed.  Patient left the clinic in stable condition.   Clinical History: EXAM: MRI CERVICAL SPINE WO CONTRAST   CLINICAL INDICATION: Neck pain, bilateral cervical radiculopathy.   COMPARISON: None.   TECHNIQUE: The study was acquired using the following pulse sequences:  Sagittal T1, sagittal T2, sagittal IR, axial T2.   FINDINGS: The curvature of the cervical spine is normal. Vertebral body  height, alignment and bone marrow signal intensity appear  normal. The  central spinal canal is adequately patent without evidence of extra-axial  fluid collection or mass. The cervical cord is normal in course, caliber  and signal intensity. The craniocervical junction is unremarkable. At C4-C5  there is mild loss of height and decreased T2 signal intensity of the  intervertebral disc, minimal posterior disc osteophyte complex, bilateral  uncovertebral joint osteophytes.  Moderate right and mild left-sided neural  foraminal narrowing.  No significant neural foraminal narrowing at other  cervical levels..   The paraspinal soft tissue appear unremarkable.   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  No evidence  of central canal stenosis or signal abnormality in the cervical cord.   Electronically Signed by: Shelbie Ammons, M.D.  Signed on: 12/14/2021 3:06 PM     Objective:  VS:  HT:    WT:   BMI:     BP:(!) 152/87  HR:60bpm  TEMP: ( )  RESP:  Physical Exam Vitals and nursing note reviewed.  Constitutional:      General: He is not in acute distress.    Appearance: Normal appearance. He is  well-developed. He is not ill-appearing.  HENT:     Head: Normocephalic and atraumatic.     Right Ear: External ear normal.     Left Ear: External ear normal.     Nose: No congestion.  Eyes:     Extraocular Movements: Extraocular movements intact.     Conjunctiva/sclera: Conjunctivae normal.     Pupils: Pupils are equal, round, and reactive to light.  Cardiovascular:     Rate and Rhythm: Normal rate.     Pulses: Normal pulses.     Heart sounds: Normal heart sounds.  Pulmonary:     Effort: Pulmonary effort is normal. No respiratory distress.  Abdominal:     General: There is no distension.     Palpations: Abdomen is soft.  Musculoskeletal:        General: Tenderness present. No signs of injury.     Cervical back: Normal range of motion and neck  supple. No rigidity.     Right lower leg: No edema.     Left lower leg: No edema.     Comments: Patient has good distal strength without clonus.  Skin:    General: Skin is warm and dry.     Findings: No erythema or rash.  Neurological:     General: No focal deficit present.     Mental Status: He is alert and oriented to person, place, and time.     Cranial Nerves: No cranial nerve deficit.     Sensory: Sensory deficit present.     Motor: No weakness or abnormal muscle tone.     Coordination: Coordination normal.     Gait: Gait normal.  Psychiatric:        Mood and Affect: Mood normal.        Behavior: Behavior normal.      Imaging: No results found.

## 2023-12-12 ENCOUNTER — Ambulatory Visit: Payer: 59 | Admitting: Physical Medicine and Rehabilitation

## 2023-12-12 ENCOUNTER — Encounter: Payer: Self-pay | Admitting: Physical Medicine and Rehabilitation

## 2023-12-12 DIAGNOSIS — M5412 Radiculopathy, cervical region: Secondary | ICD-10-CM

## 2023-12-12 DIAGNOSIS — M4802 Spinal stenosis, cervical region: Secondary | ICD-10-CM

## 2023-12-12 DIAGNOSIS — M7918 Myalgia, other site: Secondary | ICD-10-CM | POA: Diagnosis not present

## 2023-12-12 NOTE — Progress Notes (Unsigned)
 Pain Scale   Average Pain 1 2 Week F/U from Cervical injection, patient advised he is feeling much better and now has Range pf motion improvement.Marland Kitchen

## 2023-12-12 NOTE — Progress Notes (Unsigned)
 Samuel Valentine - 53 y.o. male MRN [id number]  Date of birth: 12/25/1970  Office Visit Note: Visit Date: 12/12/2023 PCP: Ronnald Nian, MD Referred by: Ronnald Nian, MD  Subjective: Chief Complaint  Patient presents with   Neck - Follow-up   HPI: Samuel Valentine is a 53 y.o. male who comes in today  for evaluation of chronic bilateral neck pain radiating to left shoulder and down left arm. Also reports paraesthesias to 3rd, 4th and 5th digits of left hand. Pain worsens with movement and activity, describes pain as dull and stabbing sensation, currently rates as 2 out of 10. Patient is here today for follow up from previous left C7-T1 interlaminar epidural steroid injection performed in our office on 11/28/2023. He reports greater than 80% relief and continues to sustain. History of formal physical therapy in [place], some relief of pain with these treatments. Cervical MRI imaging in 2019 from [facility] exhibits uncovertebral changes at C4-C5 with moderate right and mild left foraminal stenosis. No high grade spinal canal stenosis noted. Patient underwent left C7-T1 interlaminar epidural steroid injection with Dr. Audelia Hives in [place]. Patient denies focal weakness. No recent trauma or falls.      ROS Otherwise per HPI.  Assessment & Plan: Visit Diagnoses:    ICD-10-CM   1. Cervical radiculopathy  M54.12     2. Foraminal stenosis of cervical region  M48.02     3. Myofascial pain syndrome  M79.18        Plan: Findings:  Chronic bilateral neck pain radiating to left shoulder and down left arm. Paresthesias to 3rd, 4th and 5th digits of left hand. Significant and sustained relief of pain with recent left C7-T1 interlaminar epidural steroid injection. Increased functional ability and range of motion post injection. He continues with conservative therapies and home exercise regimen. I discussed possibility of repeating injection infrequently as needed. We will continue to monitor,  I encouraged patient to call us as needed. No red flag symptoms noted upon exam today.     Meds & Orders: No orders of the defined types were placed in this encounter.  No orders of the defined types were placed in this encounter.   Follow-up: Return if symptoms worsen or fail to improve.   Procedures: No procedures performed      Clinical History: EXAM: MRI CERVICAL SPINE WO CONTRAST   CLINICAL INDICATION: Neck pain, bilateral cervical radiculopathy.   COMPARISON: None.   TECHNIQUE: The study was acquired using the following pulse sequences:  Sagittal T1, sagittal T2, sagittal IR, axial T2.   FINDINGS: The curvature of the cervical spine is normal. Vertebral body  height, alignment and bone marrow signal intensity appear normal. The  central spinal canal is adequately patent without evidence of extra-axial  fluid collection or mass. The cervical cord is normal in course, caliber  and signal intensity. The craniocervical junction is unremarkable. At C4-C5  there is mild loss of height and decreased T2 signal intensity of the  intervertebral disc, minimal posterior disc osteophyte complex, bilateral  uncovertebral joint osteophytes.  Moderate right and mild left-sided neural  foraminal narrowing.  No significant neural foraminal narrowing at other  cervical levels..   The paraspinal soft tissue appear unremarkable.   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  No evidence  of central canal stenosis or signal abnormality in the cervical cord.   Electronically Signed by: Shelbie Ammons, Judie Petit.D.  Signed on: 12/14/2021 3:06 PM   He reports that he has never smoked. He has never used smokeless tobacco.  Recent Labs    02/24/23 0813 06/15/23 1448  HGBA1C 6.6* 6.7*    Objective:  VS:  HT:    WT:   BMI:     BP:   HR: bpm  TEMP: ( )  RESP:  Physical Exam Vitals and nursing note  reviewed.  HENT:     Head: Normocephalic and atraumatic.     Right Ear: External ear normal.     Left Ear: External ear normal.     Nose: Nose normal.     Mouth/Throat:     Mouth: Mucous membranes are moist.  Eyes:     Extraocular Movements: Extraocular movements intact.  Cardiovascular:     Rate and Rhythm: Normal rate.     Pulses: Normal pulses.  Pulmonary:     Effort: Pulmonary effort is normal.  Abdominal:     General: Abdomen is flat. There is no distension.  Musculoskeletal:        General: Normal range of motion.     Cervical back: Normal range of motion.     Comments: No discomfort noted with flexion, extension and side-to-side rotation. Patient has good strength in the upper extremities including 5 out of 5 strength in wrist extension, long finger flexion and APB. Shoulder range of motion is full bilaterally without any sign of impingement. There is no atrophy of the hands intrinsically. Sensation intact bilaterally. Negative Hoffman's sign. Negative Spurling's sign.     Skin:    General: Skin is warm and dry.     Capillary Refill: Capillary refill takes less than 2 seconds.  Neurological:     General: No focal deficit present.     Mental Status: He is alert and oriented to person, place, and time.  Psychiatric:        Mood and Affect: Mood normal.        Behavior: Behavior normal.     Ortho Exam  Imaging: No results found.  Past Medical/Family/Surgical/Social History: Medications & Allergies reviewed per EMR, new medications updated. 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 Fransico Michael   No family history on file. Past Surgical History:  Procedure Laterality Date   CHOLECYSTECTOMY N/A 07/13/2023   Procedure: LAPAROSCOPIC CHOLECYSTECTOMY;  Surgeon: Emelia Loron, MD;  [facility];  Service: General;  Laterality: N/A;   Social History   Occupational History   Occupation: data Buyer, retail: [employer]  Tobacco Use   Smoking status: Never   Smokeless tobacco: Never  Vaping Use   Vaping status: Never Used  Substance and Sexual Activity   Alcohol use: Yes    Comment: maybe one drink  per month   Drug use: No   Sexual activity: Yes    Partners: Male

## 2024-01-03 ENCOUNTER — Other Ambulatory Visit: Payer: Self-pay

## 2024-01-03 DIAGNOSIS — E109 Type 1 diabetes mellitus without complications: Secondary | ICD-10-CM

## 2024-01-03 MED ORDER — DEXCOM G6 SENSOR MISC: 1.0000 | 3 refills | Status: AC

## 2024-01-04 ENCOUNTER — Ambulatory Visit: Payer: Self-pay | Admitting: Family Medicine

## 2024-01-04 VITALS — BP 122/80 | HR 87 | Wt 221.8 lb

## 2024-01-04 DIAGNOSIS — Z209 Contact with and (suspected) exposure to unspecified communicable disease: Secondary | ICD-10-CM | POA: Diagnosis not present

## 2024-01-04 NOTE — Progress Notes (Signed)
   Subjective:    Patient ID: Samuel Valentine, male    DOB: 1970-11-14, 53 y.o.   MRN: [id number]  HPI He is here for recheck on PrEP.  He is using Truvada regularly.  He also has Doxy prep available.  He has had no dysuria, discharge etc.   Review of Systems     Objective:    Physical Exam  Alert and in no distress otherwise not examined      Assessment & Plan:   Contact with or exposure to communicable disease - Plan: RPR+HIV+GC+CT Panel

## 2024-01-05 ENCOUNTER — Encounter: Payer: Self-pay | Admitting: Family Medicine

## 2024-01-05 DIAGNOSIS — Z209 Contact with and (suspected) exposure to unspecified communicable disease: Secondary | ICD-10-CM

## 2024-01-05 MED ORDER — EMTRICITABINE-TENOFOVIR DF 200-300 MG PO TABS: 1.0000 | ORAL_TABLET | Freq: Every day | ORAL | 0 refills | Status: DC

## 2024-01-06 ENCOUNTER — Encounter: Payer: Self-pay | Admitting: Family Medicine

## 2024-01-06 LAB — RPR+HIV+GC+CT PANEL
Chlamydia trachomatis, NAA: NEGATIVE
HIV Screen 4th Generation wRfx: NONREACTIVE
Neisseria Gonorrhoeae by PCR: NEGATIVE
RPR Ser Ql: NONREACTIVE

## 2024-01-12 LAB — LAB REPORT - SCANNED
A1c: 7.1
Albumin, Urine POC: <3
Creatinine, POC: 78.9 mg/dL
EGFR: 77
Microalb Creat Ratio: <4

## 2024-02-02 ENCOUNTER — Encounter: Payer: Self-pay | Admitting: Physical Medicine and Rehabilitation

## 2024-02-13 ENCOUNTER — Encounter: Payer: Self-pay | Admitting: Family Medicine

## 2024-03-15 ENCOUNTER — Other Ambulatory Visit: Payer: Self-pay

## 2024-03-15 ENCOUNTER — Telehealth: Payer: Self-pay

## 2024-03-15 NOTE — Telephone Encounter (Signed)
 Patient has not been seen in over 6 months, pharmacy requested refill. Patient called to schedule appointment with office. VM left for patient.

## 2024-03-16 ENCOUNTER — Other Ambulatory Visit: Payer: Self-pay

## 2024-03-16 DIAGNOSIS — E1065 Type 1 diabetes mellitus with hyperglycemia: Secondary | ICD-10-CM

## 2024-03-16 MED ORDER — INSULIN ASPART 100 UNIT/ML IJ SOLN: INTRAMUSCULAR | 1 refills | Status: AC

## 2024-03-19 ENCOUNTER — Other Ambulatory Visit: Payer: Self-pay | Admitting: Family Medicine

## 2024-03-19 DIAGNOSIS — Z209 Contact with and (suspected) exposure to unspecified communicable disease: Secondary | ICD-10-CM

## 2024-04-06 ENCOUNTER — Encounter: Payer: Self-pay | Admitting: Advanced Practice Midwife

## 2024-06-06 ENCOUNTER — Encounter: Payer: Self-pay | Admitting: Physical Medicine and Rehabilitation

## 2024-06-07 ENCOUNTER — Other Ambulatory Visit: Payer: Self-pay | Admitting: Physical Medicine and Rehabilitation

## 2024-06-07 DIAGNOSIS — M5412 Radiculopathy, cervical region: Secondary | ICD-10-CM

## 2024-06-11 ENCOUNTER — Other Ambulatory Visit: Payer: Self-pay | Admitting: Family Medicine

## 2024-06-11 DIAGNOSIS — Z209 Contact with and (suspected) exposure to unspecified communicable disease: Secondary | ICD-10-CM

## 2024-07-02 ENCOUNTER — Other Ambulatory Visit: Payer: Self-pay

## 2024-07-02 ENCOUNTER — Ambulatory Visit: Admitting: Physical Medicine and Rehabilitation

## 2024-07-02 VITALS — BP 113/75 | HR 69

## 2024-07-02 DIAGNOSIS — M5412 Radiculopathy, cervical region: Secondary | ICD-10-CM

## 2024-07-02 MED ORDER — METHYLPREDNISOLONE ACETATE 80 MG/ML IJ SUSP
40.0000 mg | Freq: Once | INTRAMUSCULAR | Status: AC
  Administered 2024-07-02: 40 mg

## 2024-07-02 NOTE — Procedures (Signed)
 Cervical Epidural Steroid Injection - Interlaminar Approach with Fluoroscopic Guidance  Patient: Samuel Valentine      Date of Birth: November 21, 1970 MRN: [id number] PCP: Joyce Norleen BROCKS, MD      Visit Date: 07/02/2024   Universal Protocol:    Date/Time: 10/13/259:35 AM  Consent Given By: the patient  Position: PRONE  Additional Comments: Vital signs were monitored before and after the procedure. Patient was prepped and draped in the usual sterile fashion. The correct patient, procedure, and site was verified.   Injection Procedure Details:   Procedure diagnoses: Cervical radiculopathy [M54.12]    Meds Administered:  Meds ordered this encounter  Medications   methylPREDNISolone  acetate (DEPO-MEDROL ) injection 40 mg     Laterality: Left  Location/Site: C7-T1  Needle: 3.5 in., 20 ga. Tuohy  Needle Placement: Paramedian epidural space  Findings:  -Comments: Excellent flow of contrast into the epidural space.  Procedure Details: Using a paramedian approach from the side mentioned above, the region overlying the inferior lamina was localized under fluoroscopic visualization and the soft tissues overlying this structure were infiltrated with 4 ml. of 1% Lidocaine  without Epinephrine . A # 20 gauge, Tuohy needle was inserted into the epidural space using a paramedian approach.  The epidural space was localized using loss of resistance along with contralateral oblique bi-planar fluoroscopic views.  After negative aspirate for air, blood, and CSF, a 2 ml. volume of Isovue-250 was injected into the epidural space and the flow of contrast was observed. Radiographs were obtained for documentation purposes.   The injectate was administered into the level noted above.  Additional Comments:  The patient tolerated the procedure well Dressing: 2 x 2 sterile gauze and Band-Aid    Post-procedure details: Patient was observed during the procedure. Post-procedure instructions were  reviewed.  Patient left the clinic in stable condition.

## 2024-07-02 NOTE — Progress Notes (Signed)
 Alesandro Stueve - 53 y.o. male MRN [id number]  Date of birth: 09-Jun-1971  Office Visit Note: Visit Date: 07/02/2024 PCP: Joyce Norleen BROCKS, MD Referred by: Joyce Norleen BROCKS, MD  Subjective: Chief Complaint  Patient presents with   Lower Back - Pain   HPI:  Mattheu Brodersen is a 53 y.o. male who comes in today at the request of Duwaine Pouch, FNP for planned Left C7-T1 Cervical Interlaminar epidural steroid injection with fluoroscopic guidance.  The patient has failed conservative care including home exercise, medications, time and activity modification.  This injection will be diagnostic and hopefully therapeutic.  Please see requesting physician notes for further details and justification.   ROS Otherwise per HPI.  Assessment & Plan: Visit Diagnoses:    ICD-10-CM   1. Cervical radiculopathy  M54.12 XR C-ARM NO REPORT    Epidural Steroid injection    methylPREDNISolone  acetate (DEPO-MEDROL ) injection 40 mg      Plan: No additional findings.   Meds & Orders:  Meds ordered this encounter  Medications   methylPREDNISolone  acetate (DEPO-MEDROL ) injection 40 mg    Orders Placed This Encounter  Procedures   XR C-ARM NO REPORT   Epidural Steroid injection    Follow-up: Return for visit to requesting provider as needed.   Procedures: No procedures performed  Cervical Epidural Steroid Injection - Interlaminar Approach with Fluoroscopic Guidance  Patient: Atul Delucia      Date of Birth: [birth year] MRN: [id number] PCP: Joyce Norleen BROCKS, MD      Visit Date: 07/02/2024   Universal Protocol:    Date/Time: 10/13/259:35 AM  Consent Given By: the patient  Position: PRONE  Additional Comments: Vital signs were monitored before and after the procedure. Patient was prepped and draped in the usual sterile fashion. The correct patient, procedure, and site was verified.   Injection Procedure Details:   Procedure diagnoses: Cervical radiculopathy [M54.12]    Meds Administered:   Meds ordered this encounter  Medications   methylPREDNISolone  acetate (DEPO-MEDROL ) injection 40 mg     Laterality: Left  Location/Site: C7-T1  Needle: 3.5 in., 20 ga. Tuohy  Needle Placement: Paramedian epidural space  Findings:  -Comments: Excellent flow of contrast into the epidural space.  Procedure Details: Using a paramedian approach from the side mentioned above, the region overlying the inferior lamina was localized under fluoroscopic visualization and the soft tissues overlying this structure were infiltrated with 4 ml. of 1% Lidocaine  without Epinephrine . A # 20 gauge, Tuohy needle was inserted into the epidural space using a paramedian approach.  The epidural space was localized using loss of resistance along with contralateral oblique bi-planar fluoroscopic views.  After negative aspirate for air, blood, and CSF, a 2 ml. volume of Isovue-250 was injected into the epidural space and the flow of contrast was observed. Radiographs were obtained for documentation purposes.   The injectate was administered into the level noted above.  Additional Comments:  The patient tolerated the procedure well Dressing: 2 x 2 sterile gauze and Band-Aid    Post-procedure details: Patient was observed during the procedure. Post-procedure instructions were reviewed.  Patient left the clinic in stable condition.    Clinical History: EXAM: MRI CERVICAL SPINE WO CONTRAST   CLINICAL INDICATION: Neck pain, bilateral cervical radiculopathy.   COMPARISON: None.   TECHNIQUE: The study was acquired using the following pulse sequences:  Sagittal T1, sagittal T2, sagittal IR, axial T2.   FINDINGS: The curvature of the cervical spine is normal. Vertebral body  height, alignment and  bone marrow signal intensity appear normal. The  central spinal canal is adequately patent without evidence of extra-axial  fluid collection or mass. The cervical cord is normal in course, caliber  and signal  intensity. The craniocervical junction is unremarkable. At C4-C5  there is mild loss of height and decreased T2 signal intensity of the  intervertebral disc, minimal posterior disc osteophyte complex, bilateral  uncovertebral joint osteophytes.  Moderate right and mild left-sided neural  foraminal narrowing.  No significant neural foraminal narrowing at other  cervical levels..   The paraspinal soft tissue appear unremarkable.   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  No evidence  of central canal stenosis or signal abnormality in the cervical cord.   Electronically Signed by: ESTRELLA GREIG, M.D.  Signed on: 12/14/2021 3:06 PM     Objective:  VS:  HT:    WT:   BMI:     BP:113/75  HR:69bpm  TEMP: ( )  RESP:  Physical Exam Vitals and nursing note reviewed.  Constitutional:      General: He is not in acute distress.    Appearance: Normal appearance. He is not ill-appearing.  HENT:     Head: Normocephalic and atraumatic.     Right Ear: External ear normal.     Left Ear: External ear normal.  Eyes:     Extraocular Movements: Extraocular movements intact.  Cardiovascular:     Rate and Rhythm: Normal rate.     Pulses: Normal pulses.  Abdominal:     General: There is no distension.     Palpations: Abdomen is soft.  Musculoskeletal:        General: No signs of injury.     Cervical back: Neck supple. Tenderness present. No rigidity.     Right lower leg: No edema.     Left lower leg: No edema.     Comments: Patient has good strength in the upper extremities with 5 out of 5 strength in wrist extension long finger flexion APB.  No intrinsic hand muscle atrophy.  Negative Hoffmann's test.  Lymphadenopathy:     Cervical: No cervical adenopathy.  Skin:    Findings: No erythema or rash.  Neurological:     General: No focal deficit present.     Mental Status: He is alert and oriented  to person, place, and time.     Sensory: No sensory deficit.     Motor: No weakness or abnormal muscle tone.     Coordination: Coordination normal.  Psychiatric:        Mood and Affect: Mood normal.        Behavior: Behavior normal.      Imaging: No results found.

## 2024-07-02 NOTE — Progress Notes (Signed)
 Pain Scale   Average Pain 7 Patient advising he has chronic neck pain radiating to leg shoulder and at times radiating to right shoulder. Pain is constant.        +Driver, -BT, -Dye Allergies.

## 2024-07-23 ENCOUNTER — Encounter: Payer: Self-pay | Admitting: Radiology

## 2024-09-03 ENCOUNTER — Other Ambulatory Visit: Payer: Self-pay | Admitting: Family Medicine

## 2024-09-03 DIAGNOSIS — Z209 Contact with and (suspected) exposure to unspecified communicable disease: Secondary | ICD-10-CM

## 2024-09-17 ENCOUNTER — Ambulatory Visit: Admitting: Family Medicine

## 2024-09-17 ENCOUNTER — Encounter: Payer: Self-pay | Admitting: Family Medicine

## 2024-09-17 VITALS — BP 138/80 | HR 102 | Wt 223.4 lb

## 2024-09-17 DIAGNOSIS — G8929 Other chronic pain: Secondary | ICD-10-CM

## 2024-09-17 DIAGNOSIS — Z0000 Encounter for general adult medical examination without abnormal findings: Secondary | ICD-10-CM

## 2024-09-17 DIAGNOSIS — Z23 Encounter for immunization: Secondary | ICD-10-CM | POA: Diagnosis not present

## 2024-09-17 DIAGNOSIS — F411 Generalized anxiety disorder: Secondary | ICD-10-CM

## 2024-09-17 DIAGNOSIS — M25562 Pain in left knee: Secondary | ICD-10-CM

## 2024-09-17 DIAGNOSIS — E109 Type 1 diabetes mellitus without complications: Secondary | ICD-10-CM

## 2024-09-17 LAB — POCT GLYCOSYLATED HEMOGLOBIN (HGB A1C): Hemoglobin A1C: 6.6 % — AB (ref 4.0–5.6)

## 2024-09-17 MED ORDER — ALPRAZOLAM 1 MG PO TABS: 1.0000 mg | ORAL_TABLET | Freq: Two times a day (BID) | ORAL | 0 refills | Status: AC | PRN

## 2024-09-17 NOTE — Patient Instructions (Addendum)
 Vraylar  Please look into this medication.
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
## 2024-09-21 ENCOUNTER — Other Ambulatory Visit
